# Patient Record
Sex: Female | Born: 2000 | Race: White | Hispanic: No | Marital: Married | Smoking: Never smoker
Health system: Southern US, Community
[De-identification: ages and names within clinical notes are randomized; demographics above are authoritative.]

## PROBLEM LIST (undated history)

## (undated) ENCOUNTER — Inpatient Hospital Stay (HOSPITAL_COMMUNITY): Payer: Self-pay

## (undated) DIAGNOSIS — F3181 Bipolar II disorder: Secondary | ICD-10-CM

## (undated) DIAGNOSIS — T7840XA Allergy, unspecified, initial encounter: Secondary | ICD-10-CM

## (undated) DIAGNOSIS — K8020 Calculus of gallbladder without cholecystitis without obstruction: Secondary | ICD-10-CM

## (undated) DIAGNOSIS — F419 Anxiety disorder, unspecified: Secondary | ICD-10-CM

## (undated) DIAGNOSIS — F4310 Post-traumatic stress disorder, unspecified: Secondary | ICD-10-CM

## (undated) DIAGNOSIS — J45909 Unspecified asthma, uncomplicated: Secondary | ICD-10-CM

## (undated) DIAGNOSIS — I471 Supraventricular tachycardia, unspecified: Secondary | ICD-10-CM

## (undated) DIAGNOSIS — T8859XA Other complications of anesthesia, initial encounter: Secondary | ICD-10-CM

## (undated) DIAGNOSIS — Z8759 Personal history of other complications of pregnancy, childbirth and the puerperium: Secondary | ICD-10-CM

## (undated) DIAGNOSIS — F319 Bipolar disorder, unspecified: Secondary | ICD-10-CM

## (undated) HISTORY — PX: OTHER SURGICAL HISTORY: SHX169

## (undated) HISTORY — DX: Other complications of anesthesia, initial encounter: T88.59XA

## (undated) HISTORY — PX: NASAL RECONSTRUCTION: SHX2069

## (undated) HISTORY — DX: Personal history of other complications of pregnancy, childbirth and the puerperium: Z87.59

---
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
## 2021-06-17 ENCOUNTER — Ambulatory Visit
Admission: EM | Admit: 2021-06-17 | Discharge: 2021-06-17 | Disposition: A | Discharge status: Home / Self Care | Payer: Medicaid Other | Attending: Family Medicine | Admitting: Family Medicine

## 2021-06-17 DIAGNOSIS — J069 Acute upper respiratory infection, unspecified: Secondary | ICD-10-CM

## 2021-06-17 DIAGNOSIS — J4541 Moderate persistent asthma with (acute) exacerbation: Secondary | ICD-10-CM

## 2021-06-17 DIAGNOSIS — J029 Acute pharyngitis, unspecified: Secondary | ICD-10-CM

## 2021-06-17 LAB — POCT RAPID STREP A (OFFICE): Rapid Strep A Screen: NEGATIVE

## 2021-06-17 MED ORDER — PREDNISONE 20 MG PO TABS: 40.0000 mg | ORAL_TABLET | Freq: Every day | ORAL | 0 refills | Status: AC

## 2021-06-17 MED ORDER — BENZONATATE 100 MG PO CAPS: 100.0000 mg | ORAL_CAPSULE | Freq: Three times a day (TID) | ORAL | 0 refills | Status: DC | PRN

## 2021-06-17 NOTE — Discharge Instructions (Addendum)
Your strep test is negative.  Culture of the throat will be sent, and staff will notify you if that is in turn positive.  Take benzonatate 100 mg, 1 tab every 8 hours as needed for cough.  Prednisone 20 mg--take 2 daily for 5 days

## 2021-06-17 NOTE — ED Provider Notes (Signed)
[name]    CSN: [id number] Arrival date & time: 06/17/21  1032      History   Chief Complaint Chief Complaint  Patient presents with   URI    HPI Emily Golden is a 21 y.o. female.    URI  Here for a 1 week history of cough, sore throat, and congestion.  She has been also wheezing more frequently than usual.  She does have a history of asthma.  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      Home Medications    Prior to Admission medications   Medication Sig Start Date End Date Taking? Authorizing Provider  benzonatate (TESSALON) 100 MG capsule Take 1 capsule (100 mg total) by mouth 3 (three) times daily as needed for cough. 06/17/21  Yes Nakina Spatz, Janace Aris, MD  predniSONE (DELTASONE) 20 MG tablet Take 2 tablets (40 mg total) by mouth daily with breakfast for 5 days. 06/17/21 06/22/21 Yes Reinhart Saulters, Janace Aris, MD  albuterol (PROVENTIL HFA;VENTOLIN HFA) 108 (90 Base) MCG/ACT inhaler Inhale 2 puffs into the lungs every 6 (six) hours as needed for wheezing or shortness of breath.    [provider]  beclomethasone (QVAR) 80 MCG/ACT inhaler Inhale 2 puffs into the lungs daily.    [provider]  busPIRone (BUSPAR) 10 MG tablet Take 10 mg by mouth 2 (two) times daily. 05/22/20   [provider]  escitalopram (LEXAPRO) 10 MG  tablet Take 10 mg by mouth daily.    [provider]  fexofenadine (ALLEGRA) 60 MG tablet Take 60 mg by mouth 2 (two) times daily.    [provider]  FLUoxetine (PROZAC) 40 MG capsule Take 1 capsule (40 mg total) by mouth daily. 06/19/18   Leata Mouse, MD  hydrOXYzine (ATARAX/VISTARIL) 50 MG tablet Take 1 tablet (50 mg total) by mouth at bedtime. 06/18/18   Leata Mouse, MD  ibuprofen (ADVIL) 800 MG tablet Take 1 tablet (800 mg total) by mouth every 8 (eight) hours as needed (for headaches). 08/24/20   Valinda Hoar, NP  medroxyPROGESTERone Acetate 150 MG/ML SUSY Inject 150 mg into the muscle every 3 (three) months. Depo 10/13/17   [provider]  ondansetron (ZOFRAN ODT) 4 MG disintegrating tablet Take 1 tablet (4 mg total) by mouth every 8 (eight) hours as needed for nausea or vomiting. 05/09/19   Cathie Hoops, Amy V, PA-C  pantoprazole (PROTONIX) 20 MG tablet Take 20 mg by mouth daily.    [provider]  prazosin (MINIPRESS) 2 MG capsule Take 1 capsule (2 mg total) by mouth at bedtime. 06/18/18   Leata Mouse, MD  pseudoephedrine (SUDAFED) 30 MG tablet Take 1 tablet (30 mg total) by mouth every 4 (four) hours as needed for congestion. 10/20/20   Gustavus Bryant, FNP  Soft Lens  Products (REWETTING DROPS) SOLN 1-3 drops as needed (to re-wet).    [provider]  sucralfate (CARAFATE) 1 g tablet Take 1 g by mouth 4 (four) times daily -  with meals and at bedtime.    [provider]    Family History Family History  Problem Relation Age of Onset   Diabetes Maternal Grandmother    Hypertension Maternal Grandmother    Hyperlipidemia Maternal Grandmother    Heart disease Maternal Grandfather    Cancer Maternal Grandfather    Diabetes Maternal Grandfather    Heart failure Neg Hx     Social History Social History   Tobacco Use   Smoking status: Former    Types: Cigarettes    Quit date: 04/09/2018    Years since  quitting: 3.1   Smokeless tobacco: Never   Tobacco comments:    Says no smoking for 2 months  Vaping Use   Vaping Use: Former  Substance Use Topics   Alcohol use: Not Currently   Drug use: Not Currently    Comment: Reports nt for 2 months     Allergies   Other, Strawberry (diagnostic), and Strawberry extract   Review of Systems Review of Systems   Physical Exam Triage Vital Signs ED Triage Vitals  Enc Vitals Group     BP --      Pulse Rate 06/17/21 1101 78     Resp 06/17/21 1101 18     Temp 06/17/21 1101 98.1 F (36.7 C)     Temp Source 06/17/21 1101 Oral     SpO2 06/17/21 1101 98 %     Weight --      Height --      Head Circumference --      Peak Flow --      Pain Score 06/17/21 1105 5     Pain Loc --      Pain Edu? --      Excl. in GC? --    No data found.  Updated Vital Signs Pulse 78   Temp 98.1 F (36.7 C) (Oral)   Resp 18   LMP 05/26/2021   SpO2 98%   Visual Acuity Right Eye Distance:   Left Eye Distance:   Bilateral Distance:    Right Eye Near:   Left Eye Near:    Bilateral Near:     Physical Exam Vitals reviewed.  Constitutional:      General: She is not in acute distress.    Appearance: She is not toxic-appearing.  HENT:     Right Ear: Tympanic membrane and ear canal normal.     Left Ear: Tympanic membrane and ear canal normal.     Nose: Nose normal.     Mouth/Throat:     Mouth: Mucous membranes are moist.     Comments: There is some clear mucus in the tonsillar crypts and tonsils are 2+ hypertrophy.  There is very mild erythema of the tonsils no asymmetry Eyes:     Extraocular Movements: Extraocular movements intact.     Conjunctiva/sclera: Conjunctivae normal.     Pupils: Pupils are equal, round, and reactive to light.  Cardiovascular:     Rate and Rhythm: Normal rate and regular rhythm.     Heart sounds: No murmur heard. Pulmonary:     Effort: Pulmonary effort is normal. No respiratory distress.     Breath sounds: No  stridor. No wheezing, rhonchi or rales.  Musculoskeletal:     Cervical back: Neck supple.  Lymphadenopathy:     Cervical: No cervical adenopathy.  Skin:    Capillary Refill: Capillary refill takes less than 2 seconds.     Coloration: Skin is not jaundiced or pale.  Neurological:     General: No focal deficit present.     Mental Status: She is alert and oriented to person, place, and time.  Psychiatric:        Behavior: Behavior normal.      UC Treatments / Results  Labs (all labs ordered are listed, but only abnormal results are displayed) Labs Reviewed  CULTURE, GROUP A STREP [facility])  POCT RAPID STREP A (OFFICE)    EKG   Radiology No results found.  Procedures Procedures (including critical care time)  Medications Ordered in UC Medications - No data to display  Initial Impression / Assessment and Plan / UC Course  I have reviewed the triage vital signs and the nursing notes.  Pertinent labs & imaging results that were available during my care of the patient were reviewed by me and considered in my medical decision making (see chart for details).     Rapid strep is negative, so we will send culture and treat per protocol if positive I am going to send her a prescription for prednisone to treat asthma exacerbation.  Final Clinical Impressions(s) / UC Diagnoses   Final diagnoses:  Viral URI with cough  Sore throat  Moderate persistent asthma with (acute) exacerbation     Discharge Instructions      Your strep test is negative.  Culture of the throat will be sent, and staff will notify you if that is in turn positive.  Take benzonatate 100 mg, 1 tab every 8 hours as needed for cough.  Prednisone 20 mg--take 2 daily for 5 days     ED Prescriptions     Medication Sig Dispense Auth. Provider   predniSONE (DELTASONE) 20 MG tablet Take 2 tablets (40 mg total) by mouth daily with breakfast for 5 days. 10 tablet Zenia Resides, MD   benzonatate  (TESSALON) 100 MG capsule Take 1 capsule (100 mg total) by mouth 3 (three) times daily as needed for cough. 21 capsule Zenia Resides, MD      PDMP not reviewed this encounter.   Zenia Resides, MD 06/17/21 1125

## 2021-06-17 NOTE — ED Triage Notes (Signed)
Pt presents with ongoing sore throat, nasal congestion, and bilateral ear pain X 1 week.

## 2021-06-20 LAB — CULTURE, GROUP A STREP (THRC)

## 2021-07-01 ENCOUNTER — Ambulatory Visit (HOSPITAL_COMMUNITY)
Admission: EM | Admit: 2021-07-01 | Discharge: 2021-07-01 | Disposition: A | Discharge status: Other Acute Inpatient Hospital | Payer: Medicaid Other | Attending: Family | Admitting: Family

## 2021-07-01 ENCOUNTER — Inpatient Hospital Stay (HOSPITAL_COMMUNITY)
Admission: AD | Admit: 2021-07-01 | Discharge: 2021-07-05 | DRG: 885 | Disposition: A | Discharge status: Home / Self Care | Payer: Medicaid Other | Source: Intra-hospital | Attending: Psychiatry | Admitting: Psychiatry

## 2021-07-01 ENCOUNTER — Other Ambulatory Visit: Payer: Self-pay

## 2021-07-01 DIAGNOSIS — F411 Generalized anxiety disorder: Secondary | ICD-10-CM | POA: Diagnosis present

## 2021-07-01 DIAGNOSIS — F909 Attention-deficit hyperactivity disorder, unspecified type: Secondary | ICD-10-CM | POA: Diagnosis present

## 2021-07-01 DIAGNOSIS — Z9151 Personal history of suicidal behavior: Secondary | ICD-10-CM

## 2021-07-01 DIAGNOSIS — G4700 Insomnia, unspecified: Secondary | ICD-10-CM | POA: Diagnosis present

## 2021-07-01 DIAGNOSIS — Z20822 Contact with and (suspected) exposure to covid-19: Secondary | ICD-10-CM | POA: Diagnosis present

## 2021-07-01 DIAGNOSIS — R480 Dyslexia and alexia: Secondary | ICD-10-CM | POA: Diagnosis present

## 2021-07-01 DIAGNOSIS — Z79899 Other long term (current) drug therapy: Secondary | ICD-10-CM | POA: Diagnosis not present

## 2021-07-01 DIAGNOSIS — Z91018 Allergy to other foods: Secondary | ICD-10-CM | POA: Diagnosis not present

## 2021-07-01 DIAGNOSIS — R413 Other amnesia: Secondary | ICD-10-CM | POA: Diagnosis present

## 2021-07-01 DIAGNOSIS — F3181 Bipolar II disorder: Secondary | ICD-10-CM | POA: Diagnosis present

## 2021-07-01 DIAGNOSIS — Z83438 Family history of other disorder of lipoprotein metabolism and other lipidemia: Secondary | ICD-10-CM

## 2021-07-01 DIAGNOSIS — Z9152 Personal history of nonsuicidal self-harm: Secondary | ICD-10-CM | POA: Insufficient documentation

## 2021-07-01 DIAGNOSIS — F1090 Alcohol use, unspecified, uncomplicated: Secondary | ICD-10-CM | POA: Diagnosis present

## 2021-07-01 DIAGNOSIS — F22 Delusional disorders: Secondary | ICD-10-CM | POA: Diagnosis present

## 2021-07-01 DIAGNOSIS — F4310 Post-traumatic stress disorder, unspecified: Secondary | ICD-10-CM | POA: Insufficient documentation

## 2021-07-01 DIAGNOSIS — Z818 Family history of other mental and behavioral disorders: Secondary | ICD-10-CM

## 2021-07-01 DIAGNOSIS — Z833 Family history of diabetes mellitus: Secondary | ICD-10-CM

## 2021-07-01 DIAGNOSIS — Z8249 Family history of ischemic heart disease and other diseases of the circulatory system: Secondary | ICD-10-CM

## 2021-07-01 DIAGNOSIS — F332 Major depressive disorder, recurrent severe without psychotic features: Secondary | ICD-10-CM | POA: Diagnosis present

## 2021-07-01 DIAGNOSIS — F17210 Nicotine dependence, cigarettes, uncomplicated: Secondary | ICD-10-CM | POA: Diagnosis present

## 2021-07-01 DIAGNOSIS — J45909 Unspecified asthma, uncomplicated: Secondary | ICD-10-CM | POA: Diagnosis present

## 2021-07-01 DIAGNOSIS — F410 Panic disorder [episodic paroxysmal anxiety] without agoraphobia: Secondary | ICD-10-CM | POA: Diagnosis present

## 2021-07-01 DIAGNOSIS — Z720 Tobacco use: Secondary | ICD-10-CM

## 2021-07-01 DIAGNOSIS — F17290 Nicotine dependence, other tobacco product, uncomplicated: Secondary | ICD-10-CM | POA: Diagnosis present

## 2021-07-01 DIAGNOSIS — R45851 Suicidal ideations: Secondary | ICD-10-CM | POA: Diagnosis present

## 2021-07-01 DIAGNOSIS — F1010 Alcohol abuse, uncomplicated: Secondary | ICD-10-CM

## 2021-07-01 DIAGNOSIS — F17200 Nicotine dependence, unspecified, uncomplicated: Secondary | ICD-10-CM

## 2021-07-01 LAB — COMPREHENSIVE METABOLIC PANEL
ALT: 22 U/L (ref 0–44)
AST: 21 U/L (ref 15–41)
Albumin: 3.9 g/dL (ref 3.5–5.0)
Alkaline Phosphatase: 84 U/L (ref 38–126)
Anion gap: 9 (ref 5–15)
BUN: 7 mg/dL (ref 6–20)
CO2: 23 mmol/L (ref 22–32)
Calcium: 9.5 mg/dL (ref 8.9–10.3)
Chloride: 108 mmol/L (ref 98–111)
Creatinine, Ser: 0.64 mg/dL (ref 0.44–1.00)
GFR, Estimated: >60 mL/min (ref >60)
Glucose, Bld: 71 mg/dL (ref 70–99)
Potassium: 3.9 mmol/L (ref 3.5–5.1)
Sodium: 140 mmol/L (ref 135–145)
Total Bilirubin: 0.8 mg/dL (ref 0.3–1.2)
Total Protein: 6.4 g/dL — ABNORMAL LOW (ref 6.5–8.1)

## 2021-07-01 LAB — POCT URINE DRUG SCREEN - MANUAL ENTRY (I-SCREEN)
POC Amphetamine UR: NOT DETECTED
POC Buprenorphine (BUP): NOT DETECTED
POC Cocaine UR: NOT DETECTED
POC Marijuana UR: NOT DETECTED
POC Methadone UR: NOT DETECTED
POC Methamphetamine UR: NOT DETECTED
POC Morphine: NOT DETECTED
POC Oxazepam (BZO): NOT DETECTED
POC Oxycodone UR: NOT DETECTED
POC Secobarbital (BAR): NOT DETECTED

## 2021-07-01 LAB — LIPID PANEL
Cholesterol: 159 mg/dL (ref 0–200)
HDL: 42 mg/dL (ref >40)
LDL Cholesterol: 102 mg/dL — ABNORMAL HIGH (ref 0–99)
Total CHOL/HDL Ratio: 3.8 RATIO
Triglycerides: 76 mg/dL (ref <150)
VLDL: 15 mg/dL (ref 0–40)

## 2021-07-01 LAB — TSH: TSH: 1.093 u[IU]/mL (ref 0.350–4.500)

## 2021-07-01 LAB — CBC WITH DIFFERENTIAL/PLATELET
Abs Immature Granulocytes: 0.02 10*3/uL (ref 0.00–0.07)
Basophils Absolute: 0.1 10*3/uL (ref 0.0–0.1)
Basophils Relative: 1 %
Eosinophils Absolute: 0.1 10*3/uL (ref 0.0–0.5)
Eosinophils Relative: 1 %
HCT: 40 % (ref 36.0–46.0)
Hemoglobin: 12.9 g/dL (ref 12.0–15.0)
Immature Granulocytes: 0 %
Lymphocytes Relative: 28 %
Lymphs Abs: 2.1 10*3/uL (ref 0.7–4.0)
MCH: 27.6 pg (ref 26.0–34.0)
MCHC: 32.3 g/dL (ref 30.0–36.0)
MCV: 85.5 fL (ref 80.0–100.0)
Monocytes Absolute: 0.5 10*3/uL (ref 0.1–1.0)
Monocytes Relative: 7 %
Neutro Abs: 4.9 10*3/uL (ref 1.7–7.7)
Neutrophils Relative %: 63 %
Platelets: 278 10*3/uL (ref 150–400)
RBC: 4.68 MIL/uL (ref 3.87–5.11)
RDW: 13.2 % (ref 11.5–15.5)
WBC: 7.6 10*3/uL (ref 4.0–10.5)
nRBC: 0 % (ref 0.0–0.2)

## 2021-07-01 LAB — HEMOGLOBIN A1C
Hgb A1c MFr Bld: 4.8 % (ref 4.8–5.6)
Mean Plasma Glucose: 91 mg/dL

## 2021-07-01 LAB — MAGNESIUM: Magnesium: 2.1 mg/dL (ref 1.7–2.4)

## 2021-07-01 LAB — ETHANOL: Alcohol, Ethyl (B): <10 mg/dL (ref <10)

## 2021-07-01 LAB — POCT PREGNANCY, URINE: Preg Test, Ur: NEGATIVE

## 2021-07-01 LAB — RESP PANEL BY RT-PCR (FLU A&B, COVID) ARPGX2
Influenza A by PCR: NEGATIVE
Influenza B by PCR: NEGATIVE
SARS Coronavirus 2 by RT PCR: NEGATIVE

## 2021-07-01 LAB — POC SARS CORONAVIRUS 2 AG: SARSCOV2ONAVIRUS 2 AG: NEGATIVE

## 2021-07-01 MED ORDER — ACETAMINOPHEN 325 MG PO TABS: 650.0000 mg | ORAL_TABLET | Freq: Four times a day (QID) | ORAL | Status: DC | PRN

## 2021-07-01 MED ORDER — MAGNESIUM HYDROXIDE 400 MG/5ML PO SUSP: 30.0000 mL | Freq: Every day | ORAL | Status: DC | PRN

## 2021-07-01 MED ORDER — HYDROXYZINE HCL 25 MG PO TABS
25.0000 mg | ORAL_TABLET | Freq: Three times a day (TID) | ORAL | Status: DC | PRN
  Administered 2021-07-02 – 2021-07-04 (×3): 25 mg via ORAL
  Filled 2021-07-01: qty 10
  Filled 2021-07-01 (×3): qty 1

## 2021-07-01 MED ORDER — ACETAMINOPHEN 325 MG PO TABS
650.0000 mg | ORAL_TABLET | Freq: Four times a day (QID) | ORAL | Status: DC | PRN
  Administered 2021-07-05: 650 mg via ORAL
  Filled 2021-07-01: qty 2

## 2021-07-01 MED ORDER — TRAZODONE HCL 50 MG PO TABS
50.0000 mg | ORAL_TABLET | Freq: Every evening | ORAL | Status: DC | PRN
Start: 2021-07-01 — End: 2021-07-01

## 2021-07-01 MED ORDER — TRAZODONE HCL 50 MG PO TABS
50.0000 mg | ORAL_TABLET | Freq: Every evening | ORAL | Status: DC | PRN
Start: 2021-07-01 — End: 2021-07-03
  Administered 2021-07-02 (×2): 50 mg via ORAL
  Filled 2021-07-01 (×2): qty 1

## 2021-07-01 MED ORDER — ALUM & MAG HYDROXIDE-SIMETH 200-200-20 MG/5ML PO SUSP: 30.0000 mL | ORAL | Status: DC | PRN

## 2021-07-01 MED ORDER — NICOTINE 14 MG/24HR TD PT24
14.0000 mg | MEDICATED_PATCH | Freq: Every day | TRANSDERMAL | Status: DC
  Administered 2021-07-01: 14 mg via TRANSDERMAL
  Filled 2021-07-01: qty 1

## 2021-07-01 MED ORDER — ALUM & MAG HYDROXIDE-SIMETH 200-200-20 MG/5ML PO SUSP
30.0000 mL | ORAL | Status: DC | PRN
Start: 2021-07-01 — End: 2021-07-05

## 2021-07-01 MED ORDER — HYDROXYZINE HCL 25 MG PO TABS
25.0000 mg | ORAL_TABLET | Freq: Three times a day (TID) | ORAL | Status: DC | PRN
Start: 2021-07-01 — End: 2021-07-01

## 2021-07-01 NOTE — Progress Notes (Signed)
Pt was accepted to [facility] 07/01/2021; Bed Assignment 303-2 PENDING Labs and COVID-19 at 2100.  Pt meets inpatient criteria per Doran Heater, FNP  Attending Physician will be Dr. Phineas Inches  Report can be called to: Adult unit: [phone]  Voluntary can be faxed to [phone].  Pt can arrive after 2100  Care Team notified: [facility] Rona Ravens, RN, Doran Heater, FNP, and Perry Mount, RN.  Kelton Pillar, LCSWA 07/01/2021 @ 4:43 PM
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
## 2021-07-01 NOTE — ED Notes (Signed)
Safe transport called to transfer pt to [facility]

## 2021-07-01 NOTE — ED Notes (Signed)
Report given to Erskine Squibb RN@[facility]  adult unit

## 2021-07-01 NOTE — ED Notes (Signed)
Pt sleeping at present, no distress noted, monitoring for safety. 

## 2021-07-01 NOTE — ED Notes (Signed)
Offsite Distribution called to collect STAT specimens and to deliver to [facility].   Collected via L hand. Pt tolerated well.

## 2021-07-01 NOTE — Progress Notes (Signed)
Patient states that she has been depressed for the past couple months, but states that her depression has increased since last Thursday when she found out hat her husband was texting another woman.  Patient states that she was taking Prozac, anxiety medication, a mood stabilizer and PTSD medication, but states that she stopped taking them in October because she states that she felt like a Zombie. Patient states that she has been hospitalized on two occasions for her depression. Patient states that she has attempted suicide on one occasion in the past.  Patient also has a history of cutting, but states that she has has not cut in 1.5 weeks.  Patient states that she has experienced thoughts of walking in front of a car and states that she does not care if she dies.  No HI/Psychosis.  Patient states that she has been drinking daily since Monday, but states that prior to that she was not drinking much or often.  Patient states that she has been sleeping well, but states that she has experienced a decrease in appetite.

## 2021-07-01 NOTE — ED Notes (Signed)
Pt compliant with all admission procedures. Denies current SI but said she has been depressed the past few weeks and having suicidal thoughts. Denies HI, AVH. Oriented to unit. Pt has already been accepted to [facility] and will d/c to there at 2100. Pt signed voluntary form and it has been faxed to [facility].

## 2021-07-01 NOTE — ED Provider Notes (Signed)
[facility] Urgent Care Continuous Assessment Admission H&P  Date: 07/01/21 Patient Name: Emily Golden MRN: [id number] Chief Complaint:  Chief Complaint  Patient presents with   Depression   Anxiety   Suicidal      Diagnoses:  Final diagnoses:  MDD (major depressive disorder), recurrent severe, without psychosis (HCC)  Suicidal ideation    HPI: Patient presents voluntarily to [facility] behavioral health for walk-in assessment.  Patient is accompanied by her grandmother, Emily Golden, who remains present during assessment per patient preference.    Patient is assessed, face-to-face, by nurse practitioner, seated in assessment area, no acute distress.  She  is alert and oriented, pleasant and cooperative during assessment.   Emily Golden reports passive suicidal ideations ongoing for "a few months," worsening/intensifying for one week. Patient denies plan to complete suicide today. She is unable to verbally contract for safety at this time.  She endorses history of 1 previous suicide attempt by intentional overdose 4 years ago.  She also endorses history of nonsuicidal self-harm behavior by cutting.  Most recent cutting episode 1 week ago.  Patient self-inflicted superficial laceration to her right upper thigh.  Wound is clean and open to air, patient denies pain, no evidence of inflammation, no drainage reported.  Patient reports recent stressors include "finding out my husband was texting another girl 1 week ago."  Chronic stressors include "money and life."  She reports depressed mood and passive suicidal ideation for several months.  She reports decreased appetite for 1 week.  She reports feelings of hopelessness and worthlessness.  Emily Golden shares she has increased alcohol use since Monday.  Typically she consumes 1 beer less than 1 time per month.  Since Monday she has consumed a few sips of alcohol daily.  Patient has been diagnosed with major depressive disorder, PTSD and suicidal ideation.  She  is not linked with outpatient psychiatry currently.  Most recently linked with outpatient psychiatry in October 2022, she was stable on fluoxetine however duloxetine "made me feel like a zombie."  She endorses history of 2 previous inpatient psychiatric hospitalizations.  Family mental health history includes patient's mother who has been diagnosed with anxiety in patient's father who has been diagnosed with depression, anxiety, bipolar disorder and alcohol use disorder.  Emily Golden presents with depressed mood, congruent affect. She denies homicidal ideations. Patient has normal speech and behavior.  She  denies auditory and visual hallucinations.  Patient is able to converse coherently with goal-directed thoughts and no distractibility or preoccupation.  Denies symptoms of paranoia.  Objectively there is no evidence of psychosis/mania or delusional thinking.  Emily Golden resides in [place] with her husband and 2 roommates.  She endorses access to weapons, guns in home.  She is employed in Medical illustrator.  She denies substance use aside from alcohol.  Patient endorses average sleep.  Patient offered support and encouragement.   PHQ 2-9:   Flowsheet Row ED from 06/17/2021 in [facility]  ED from 11/22/2020 in [facility] EMERGENCY DEPARTMENT ED from 10/20/2020 in [facility] at [facility]   C-SSRS RISK CATEGORY No Risk No Risk No Risk        Total Time spent with patient: 30 minutes  Musculoskeletal  Strength & Muscle Tone: within normal limits Gait & Station: normal Patient leans: N/A  Psychiatric Specialty Exam  Presentation General Appearance: Appropriate for Environment; Casual  Eye Contact:Good  Speech:Clear and Coherent; Normal Rate  Speech Volume:Normal  Handedness:Right  Mood and Affect  Mood:Depressed  Affect:Congruent; Depressed   Thought Process  Thought Processes:Coherent; Goal Directed;  Linear  Descriptions of Associations:Intact  Orientation:Full (Time, Place and Person)  Thought Content:Logical; WDL    Hallucinations:Hallucinations: None  Ideas of Reference:None  Suicidal Thoughts:Suicidal Thoughts: Yes, Passive SI Passive Intent and/or Plan: Without Plan  Homicidal Thoughts:Homicidal Thoughts: No   Sensorium  Memory:Immediate Good; Recent Good  Judgment:Fair  Insight:Fair   Executive Functions  Concentration:Good  Attention Span:Good  Recall:Good  Fund of Knowledge:Good  Language:Good   Psychomotor Activity  Psychomotor Activity:Psychomotor Activity: Normal   Assets  Assets:Communication Skills; Desire for Improvement; Financial Resources/Insurance; Housing; Intimacy; Leisure Time; Physical Health; Resilience; Social Support; Talents/Skills; Vocational/Educational   Sleep  Sleep:Sleep: Fair   Nutritional Assessment (For OBS and FBC admissions only) Has the patient had a weight loss or gain of 10 pounds or more in the last 3 months?: No Has the patient had a decrease in food intake/or appetite?: Yes Does the patient have dental problems?: No Does the patient have eating habits or behaviors that may be indicators of an eating disorder including binging or inducing vomiting?: No Has the patient recently lost weight without trying?: 2.0 Has the patient been eating poorly because of a decreased appetite?: 1 Malnutrition Screening Tool Score: 3 Nutritional Assessment Referrals: Medication/Tx changes    Physical Exam Vitals and nursing note reviewed.  Constitutional:      Appearance: Normal appearance. She is well-developed.  HENT:     Head: Normocephalic and atraumatic.     Nose: Nose normal.  Cardiovascular:     Rate and Rhythm: Normal rate.  Pulmonary:     Effort: Pulmonary effort is normal.  Musculoskeletal:        General: Normal range of motion.     Cervical back: Normal range of motion.  Skin:    General: Skin is warm  and dry.     Comments: Patient reports self-inflicted laceration to right upper thigh.  Neurological:     Mental Status: She is alert and oriented to person, place, and time.  Psychiatric:        Attention and Perception: Attention and perception normal.        Mood and Affect: Mood is depressed.        Speech: Speech normal.        Behavior: Behavior normal. Behavior is cooperative.        Thought Content: Thought content includes suicidal ideation.        Cognition and Memory: Cognition and memory normal.    Review of Systems  Constitutional: Negative.   HENT: Negative.    Eyes: Negative.   Respiratory: Negative.    Cardiovascular: Negative.   Gastrointestinal: Negative.   Genitourinary: Negative.   Musculoskeletal: Negative.   Skin: Negative.   Neurological: Negative.   Psychiatric/Behavioral:  Positive for depression and suicidal ideas.     Blood pressure 128/68, pulse 81, temperature 98.5 F (36.9 C), temperature source Oral, resp. rate 18, last menstrual period 05/26/2021, SpO2 99 %. There is no height or weight on file to calculate BMI.  Past Psychiatric History: MDD, PTSD, suicide attempt  Is the patient at risk to self? Yes  Has the patient been a risk to self in the past 6 months? Yes .    Has the patient been a risk to self within the distant past? Yes   Is the patient a risk to others? No   Has the patient been a risk to others  in the past 6 months? No   Has the patient been a risk to others within the distant past? No   Past Medical History:  Past Medical History:  Diagnosis Date   Allergy    Anxiety    Asthma     Past Surgical History:  Procedure Laterality Date   NASAL RECONSTRUCTION      Family History:  Family History  Problem Relation Age of Onset   Diabetes Maternal Grandmother    Hypertension Maternal Grandmother    Hyperlipidemia Maternal Grandmother    Heart disease Maternal Grandfather    Cancer Maternal Grandfather    Diabetes  Maternal Grandfather    Heart failure Neg Hx     Social History:  Social History   Socioeconomic History   Marital status: Single    Spouse name: Not on file   Number of children: Not on file   Years of education: Not on file   Highest education level: Not on file  Occupational History   Not on file  Tobacco Use   Smoking status: Former    Types: Cigarettes    Quit date: 04/09/2018    Years since quitting: 3.2   Smokeless tobacco: Never   Tobacco comments:    Says no smoking for 2 months  Vaping Use   Vaping Use: Former  Substance and Sexual Activity   Alcohol use: Not Currently   Drug use: Not Currently    Comment: Reports nt for 2 months   Sexual activity: Not Currently    Birth control/protection: Abstinence  Other Topics Concern   Not on file  Social History Narrative   Is in 10th grade at [employer]   Social Determinants of Health   Financial Resource Strain: Not on file  Food Insecurity: Not on file  Transportation Needs: Not on file  Physical Activity: Not on file  Stress: Not on file  Social Connections: Not on file  Intimate Partner Violence: Not on file    SDOH:  SDOH Screenings   Alcohol Screen: Low Risk  (10/19/2017)   Alcohol Screen    Last Alcohol Screening Score (AUDIT): 1  Depression (PHQ2-9): Not on file  Financial Resource Strain: Not on file  Food Insecurity: Not on file  Housing: Not on file  Physical Activity: Not on file  Social Connections: Not on file  Stress: Not on file  Tobacco Use: Medium Risk (06/17/2021)   Patient History    Smoking Tobacco Use: Former    Smokeless Tobacco Use: Never    Passive Exposure: Not on file  Transportation Needs: Not on file    Last Labs:  Admission on 06/17/2021, Discharged on 06/17/2021  Component Date Value Ref Range Status   Rapid Strep A Screen 06/17/2021 Negative  Negative Final   Specimen Description 06/17/2021 THROAT   Final   Special Requests 06/17/2021 NONE    Final   Culture 06/17/2021    Final                   Value:NO GROUP A STREP (S.PYOGENES) ISOLATED Performed at [facility], [address] [place], [place] [address]    Report Status 06/17/2021 06/20/2021 FINAL   Final    Allergies: Other, Strawberry (diagnostic), and Strawberry extract  PTA Medications: (Not in a hospital admission)   Medical Decision Making  Patient reviewed with Dr Bronwen Betters. She will be placed in observation unit while awaiting inpatient psychiatric hospitalization. Richell remains voluntary. She agrees with  plan for inpatient psychiatric hospitalization.  Laboratory studies ordered including CBC, CMP, ethanol, A1c, hepatic function, lipid panel, magnesium and TSH.  Urine pregnancy, urine drug screen ordered.  EKG order initiated.  Current medications: -Acetaminophen 650 mg every 6 as needed/mild pain -Maalox 30 mL oral every 4 as needed/digestion -Hydroxyzine 25 mg 3 times daily as needed/anxiety -Magnesium hydroxide 30 mL daily as needed/mild constipation -Trazodone 50 mg nightly as needed/sleep -Nicotine replacement patch transdermal 14 mg daily    Recommendations  Based on my evaluation the patient does not appear to have an emergency medical condition. Patient reviewed with Dr Bronwen Betters. Patient has been accepted to [facility] behavioral health for inpatient psychiatric admission by Dr. Sherron Flemings.  She verbalizes understanding and agreement with current treatment plan to include transport to [facility] behavioral health.   Lenard Lance, FNP 07/01/21  4:26 PM

## 2021-07-02 ENCOUNTER — Encounter (HOSPITAL_COMMUNITY): Payer: Self-pay | Admitting: Family

## 2021-07-02 ENCOUNTER — Encounter (HOSPITAL_COMMUNITY): Payer: Self-pay

## 2021-07-02 DIAGNOSIS — F909 Attention-deficit hyperactivity disorder, unspecified type: Secondary | ICD-10-CM

## 2021-07-02 DIAGNOSIS — F411 Generalized anxiety disorder: Secondary | ICD-10-CM

## 2021-07-02 DIAGNOSIS — F1010 Alcohol abuse, uncomplicated: Secondary | ICD-10-CM

## 2021-07-02 DIAGNOSIS — F17200 Nicotine dependence, unspecified, uncomplicated: Secondary | ICD-10-CM

## 2021-07-02 DIAGNOSIS — Z720 Tobacco use: Secondary | ICD-10-CM

## 2021-07-02 DIAGNOSIS — F3181 Bipolar II disorder: Principal | ICD-10-CM

## 2021-07-02 HISTORY — DX: Alcohol abuse, uncomplicated: F10.10

## 2021-07-02 LAB — PROLACTIN: Prolactin: 8.2 ng/mL (ref 4.8–23.3)

## 2021-07-02 MED ORDER — NICOTINE 14 MG/24HR TD PT24
14.0000 mg | MEDICATED_PATCH | Freq: Every day | TRANSDERMAL | Status: DC
Start: 2021-07-02 — End: 2021-07-03
  Administered 2021-07-02: 14 mg via TRANSDERMAL
  Filled 2021-07-02 (×2): qty 1

## 2021-07-02 MED ORDER — ALBUTEROL SULFATE HFA 108 (90 BASE) MCG/ACT IN AERS: 1.0000 | INHALATION_SPRAY | RESPIRATORY_TRACT | Status: DC | PRN

## 2021-07-02 MED ORDER — ARIPIPRAZOLE 5 MG PO TABS
5.0000 mg | ORAL_TABLET | Freq: Every day | ORAL | Status: DC
  Administered 2021-07-02 – 2021-07-03 (×2): 5 mg via ORAL
  Filled 2021-07-02 (×4): qty 1

## 2021-07-02 NOTE — Group Note (Signed)
LCSW Group Therapy Note   Group Date: 07/02/2021 Start Time: 1300 End Time: 1400   Type of Therapy and Topic:  Group Therapy: Boundaries  Participation Level:  Minimal  Description of Group: This group will address the use of boundaries in their personal lives. Patients will explore why boundaries are important, the difference between healthy and unhealthy boundaries, and negative and postive outcomes of different boundaries and will look at how boundaries can be crossed.  Patients will be encouraged to identify current boundaries in their own lives and identify what kind of boundary is being set. Facilitators will guide patients in utilizing problem-solving interventions to address and correct types boundaries being used and to address when no boundary is being used. Understanding and applying boundaries will be explored and addressed for obtaining and maintaining a balanced life. Patients will be encouraged to explore ways to assertively make their boundaries and needs known to significant others in their lives, using other group members and facilitator for role play, support, and feedback.  Therapeutic Goals:  1.  Patient will identify areas in their life where setting clear boundaries could be  used to improve their life.  2.  Patient will identify signs/triggers that a boundary is not being respected. 3.  Patient will identify two ways to set boundaries in order to achieve balance in  their lives: 4.  Patient will demonstrate ability to communicate their needs and set boundaries  through discussion and/or role plays  Summary of Patient Progress:   Patient was present for the entirety of group session. Patient participated in opening and closing remarks. However, patient did not contribute at all to the topic of discussion despite encouraged participation.   Therapeutic Modalities:   Cognitive Behavioral Therapy Solution-Focused Therapy  Graeden Bitner W Chania Kochanski, LCSWA 07/02/2021  3:47 PM     

## 2021-07-02 NOTE — Progress Notes (Signed)
Pt complained of dizziness this evening, V/S obtained and was WNL. Pt suspect that the medications she is taking could be making her dizzy. Pt stated her day was okay, reports good appetite. Pt also stated the nicotine patch is not helping much and would like to be on both the patch and gum. Pt denies SI/HI and contracted for safety, will continue to monitor.

## 2021-07-02 NOTE — Tx Team (Signed)
Initial Treatment Plan 07/02/2021 2:36 AM Emily Golden KPQ:[id number]    PATIENT STRESSORS: Financial difficulties   Marital or family conflict   Substance abuse   Traumatic event     PATIENT STRENGTHS: Education administrator  Physical Health    PATIENT IDENTIFIED PROBLEMS: Suicide ideation  Depression  "Find ways to cope on my own without others helping me to cope"  " Be on medication that actually works"               DISCHARGE CRITERIA:  Ability to meet basic life and health needs Improved stabilization in mood, thinking, and/or behavior Medical problems require only outpatient monitoring Safe-care adequate arrangements made Verbal commitment to aftercare and medication compliance  PRELIMINARY DISCHARGE PLAN: Attend aftercare/continuing care group Attend PHP/IOP Attend 12-step recovery group Outpatient therapy Return to previous living arrangement Return to previous work or school arrangements  PATIENT/FAMILY INVOLVEMENT: This treatment plan has been presented to and reviewed with the patient, Emily Golden, and/or family member.  The patient and family have been given the opportunity to ask questions and make suggestions.  Bethann Punches, RN 07/02/2021, 2:36 AM

## 2021-07-02 NOTE — Progress Notes (Signed)
   07/02/21 1350  Psych Admission Type (Psych Patients Only)  Admission Status Voluntary  Psychosocial Assessment  Patient Complaints Depression  Eye Contact Fair  Facial Expression Flat  Affect Depressed  Speech Logical/coherent  Interaction Minimal  Motor Activity Other (Comment) (WNL)  Appearance/Hygiene Unremarkable  Behavior Characteristics Cooperative;Calm  Mood Depressed  Thought Process  Coherency WDL  Content Blaming others  Delusions None reported or observed  Perception WDL  Hallucination None reported or observed  Judgment Poor  Confusion None  Danger to Self  Current suicidal ideation? Denies  Self-Injurious Behavior No self-injurious ideation or behavior indicators observed or expressed   Agreement Not to Harm Self Yes  Description of Agreement verbal  Danger to Others  Danger to Others None reported or observed
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
## 2021-07-02 NOTE — BH IP Treatment Plan (Signed)
Interdisciplinary Treatment and Diagnostic Plan Update  07/02/2021 Time of Session: 9:00am  Emily Golden MRN: [id number]  Principal Diagnosis: MDD (major depressive disorder), recurrent severe, without psychosis ([place])  Secondary Diagnoses: Principal Problem:   MDD (major depressive disorder), recurrent severe, without psychosis ([place])   Current Medications:  Current Facility-Administered Medications  Medication Dose Route Frequency Provider Last Rate Last Admin   acetaminophen (TYLENOL) tablet 650 mg  650 mg Oral Q6H PRN Lucky Rathke, FNP       alum & mag hydroxide-simeth (MAALOX/MYLANTA) 200-200-20 MG/5ML suspension 30 mL  30 mL Oral Q4H PRN Lucky Rathke, FNP       hydrOXYzine (ATARAX) tablet 25 mg  25 mg Oral TID PRN Lucky Rathke, FNP   25 mg at 07/02/21 0024   magnesium hydroxide (MILK OF MAGNESIA) suspension 30 mL  30 mL Oral Daily PRN Lucky Rathke, FNP       traZODone (DESYREL) tablet 50 mg  50 mg Oral QHS PRN Lucky Rathke, FNP   50 mg at 07/02/21 0024   PTA Medications: Medications Prior to Admission  Medication Sig Dispense Refill Last Dose   albuterol (PROVENTIL HFA;VENTOLIN HFA) 108 (90 Base) MCG/ACT inhaler Inhale 2 puffs into the lungs every 6 (six) hours as needed for wheezing or shortness of breath.   Past Month   beclomethasone (QVAR) 80 MCG/ACT inhaler Inhale 2 puffs into the lungs daily.   Past Week   folic acid (FOLVITE) 1 MG tablet Take 1 mg by mouth daily.      ibuprofen (ADVIL) 800 MG tablet Take 1 tablet (800 mg total) by mouth every 8 (eight) hours as needed (for headaches). (Patient taking differently: Take 800 mg by mouth every 8 (eight) hours as needed for headache or cramping (for headaches).) 30 tablet 1 Past Month   medroxyPROGESTERone Acetate 150 MG/ML SUSY Inject 150 mg into the muscle every 3 (three) months. Depo  3     Patient Stressors: Financial difficulties   Marital or family conflict   Substance abuse   Traumatic event    Patient Strengths:  Electronics engineer  Physical Health   Treatment Modalities: Medication Management, Group therapy, Case management,  1 to 1 session with clinician, Psychoeducation, Recreational therapy.   Physician Treatment Plan for Primary Diagnosis: MDD (major depressive disorder), recurrent severe, without psychosis ([place]) Long Term Goal(s):     Short Term Goals:    Medication Management: Evaluate patient's response, side effects, and tolerance of medication regimen.  Therapeutic Interventions: 1 to 1 sessions, Unit Group sessions and Medication administration.  Evaluation of Outcomes: Not Met  Physician Treatment Plan for Secondary Diagnosis: Principal Problem:   MDD (major depressive disorder), recurrent severe, without psychosis ([place])  Long Term Goal(s):     Short Term Goals:       Medication Management: Evaluate patient's response, side effects, and tolerance of medication regimen.  Therapeutic Interventions: 1 to 1 sessions, Unit Group sessions and Medication administration.  Evaluation of Outcomes: Not Met   RN Treatment Plan for Primary Diagnosis: MDD (major depressive disorder), recurrent severe, without psychosis ([place]) Long Term Goal(s): Knowledge of disease and therapeutic regimen to maintain health will improve  Short Term Goals: Ability to remain free from injury will improve, Ability to participate in decision making will improve, Ability to verbalize feelings will improve, Ability to disclose and discuss suicidal ideas, and Ability to identify and develop effective coping behaviors will improve  Medication Management: RN will administer medications  as ordered by provider, will assess and evaluate patient's response and provide education to patient for prescribed medication. RN will report any adverse and/or side effects to prescribing provider.  Therapeutic Interventions: 1 on 1 counseling sessions, Psychoeducation, Medication administration, Evaluate  responses to treatment, Monitor vital signs and CBGs as ordered, Perform/monitor CIWA, COWS, AIMS and Fall Risk screenings as ordered, Perform wound care treatments as ordered.  Evaluation of Outcomes: Not Met   LCSW Treatment Plan for Primary Diagnosis: MDD (major depressive disorder), recurrent severe, without psychosis ([place]) Long Term Goal(s): Safe transition to appropriate next level of care at discharge, Engage patient in therapeutic group addressing interpersonal concerns.  Short Term Goals: Engage patient in aftercare planning with referrals and resources, Increase social support, Increase emotional regulation, Facilitate acceptance of mental health diagnosis and concerns, Identify triggers associated with mental health/substance abuse issues, and Increase skills for wellness and recovery  Therapeutic Interventions: Assess for all discharge needs, 1 to 1 time with Social worker, Explore available resources and support systems, Assess for adequacy in community support network, Educate family and significant other(s) on suicide prevention, Complete Psychosocial Assessment, Interpersonal group therapy.  Evaluation of Outcomes: Not Met   Progress in Treatment: Attending groups: Yes. Participating in groups: Yes. Taking medication as prescribed: Yes. Toleration medication: Yes. Family/Significant other contact made: Yes, individual(s) contacted:  If consents are provided  Patient understands diagnosis: Yes. Discussing patient identified problems/goals with staff: Yes. Medical problems stabilized or resolved: Yes. Denies suicidal/homicidal ideation: Yes. Issues/concerns per patient self-inventory: No.   New problem(s) identified: No, Describe:  None   New Short Term/Long Term Goal(s): medication stabilization, elimination of SI thoughts, development of comprehensive mental wellness plan.   Patient Goals:  "To find better coping skills"   Discharge Plan or Barriers: Patient recently  admitted. CSW will continue to follow and assess for appropriate referrals and possible discharge planning.   Reason for Continuation of Hospitalization: Anxiety Depression Medication stabilization Suicidal ideation  Estimated Length of Stay: 3 to 7 days   Last [place] Suicide Severity Risk Score: [place] Admission (Current) from 07/01/2021 in [facility] Most recent reading at 07/02/2021  2:03 AM ED from 07/01/2021 in [facility] Most recent reading at 07/01/2021  4:58 PM ED from 06/17/2021 in [facility]  Most recent reading at 06/17/2021 11:05 AM  C-SSRS RISK CATEGORY Low Risk Moderate Risk No Risk       Last PHQ 2/9 Scores:     No data to display          Scribe for Treatment Team: Darleen Crocker, Latanya Presser 07/02/2021 10:51 AM

## 2021-07-02 NOTE — Progress Notes (Signed)
[name] is a 21 y.o. female voluntarily admitted for increased depression. Patient states that she has been depressed for the past couple months, but states that her depression has increased since last Thursday when she found out hat her husband was texting another woman. Patient also has a history of cutting, but states that she has has not cut in 1.5 weeks. Patient she has experienced thoughts of walking in front of a car and states that she does not care if she dies. Pt has been calm and cooperative with admission process, alert and oriented x 4. Reports passive SI with no plan and contracted for safety. Skin search completed with no signs of cuts or scratches. Consents signed belongings search completed and pt oriented to unit. Pt stable at this time. Pt given the opportunity to express concerns and ask questions. Pt given toiletries. Will continue to monitor.

## 2021-07-02 NOTE — H&P (Signed)
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 for depression, memory loss and substance abuse. Negative for hallucinations and suicidal ideas. The patient has insomnia. The patient is not nervous/anxious.    Blood pressure 125/67, pulse 65, temperature 98.3 F (36.8 C), temperature source Oral, resp. rate 18, height 5' 7.5" (1.715 m), weight 121 kg, last menstrual period 06/29/2021, SpO2 100 %. Body mass index is 41.17 kg/m.   ASSESSMENT:  Daily contact with patient to assess  and evaluate symptoms and progress in treatment   ASSESSMENT:   Diagnoses / Active Problems: -Bipolar disorder, type 2, current mood episode is depressed, without psychotic features -GAD -PTSD -ADHD -Tobacco use d/o  -R/o Borderline PD    PLAN: Safety and Monitoring:             --  Voluntary admission to inpatient psychiatric unit for safety, stabilization and treatment             -- Daily contact with patient to assess and evaluate symptoms and progress in treatment             -- Patient's case to be discussed in multi-disciplinary team meeting             -- Observation Level : q15 minute checks             -- Vital signs:  q12 hours             -- Precautions: suicide, elopement, and assault   2. Psychiatric Diagnoses and Treatment:               -Start Abilify 5 mg daily     -- The risks/benefits/side-effects/alternatives to this medication were discussed in detail with the patient and time was given for questions. The patient consents to medication trial.                -- Metabolic profile and EKG monitoring obtained while on an atypical antipsychotic (BMI: Lipid Panel: HbgA1c: QTc:)              -- Encouraged patient to participate in unit milieu and in scheduled group therapies              --  Short Term Goals: Ability to identify changes in lifestyle to reduce recurrence of condition will improve, Ability to verbalize feelings will improve, Ability to disclose and discuss suicidal ideas, Ability to demonstrate self-control will improve, Ability to identify and develop effective coping behaviors will improve, Ability to maintain clinical measurements within normal limits will improve, Compliance with prescribed medications will improve, and Ability to identify triggers associated with substance abuse/mental health issues will improve             -- Long Term Goals: Improvement in symptoms so as ready for discharge                3. Medical Issues Being Addressed:     Tobacco (Vaping) use disorder- PRN nicotine patch, consider outpatient tobacco cessation as vaping is negatively contributing to anxiety and mood   4. Discharge Planning:              -- Social work and case management to assist with discharge planning and identification of hospital follow-up needs prior to discharge             -- Estimated LOS: 5-7 days             -- Discharge Concerns: Need to establish a safety plan; Medication compliance and effectiveness             -- Discharge Goals: Return home with outpatient referrals for mental health follow-up including medication management/psychotherapy    Principal Problem:   MDD (major depressive disorder), recurrent severe, without psychosis (HCC)     Treatment Plan Summary:   Observation Level/Precautions:  15 minute checks  Laboratory:   See above  Psychotherapy:  See above  Medications:  See above  Consultations:  See above  Discharge Concerns:  See above  Estimated LOS: 5-7 days  Other:        Physician Treatment Plan for Primary Diagnosis: MDD (major depressive disorder), recurrent severe, without psychosis (HCC) Long Term Goal(s): Improvement in symptoms so as ready for discharge    Ortencia Kick, Medical Student 6/30/202311:49 AM    Total Time Spent in Direct Patient Care:  I personally spent 60 minutes on the unit in direct patient care. The direct patient care time included face-to-face time with the patient, reviewing the patient's chart, communicating with other professionals, and coordinating care. Greater than 50% of this time was spent in counseling or coordinating care with the patient regarding goals of hospitalization, psycho-education, and discharge planning needs.  I personally was present and performed or re-performed the history, physical exam and medical decision-making activities of this service and have verified that the service and findings are accurately documented in the student's note, , as  addended by me or notated below:  I directly edited the note, as above.  I certify that inpatient services furnished can reasonably be expected to improve the patient's condition.    Phineas Inches, MD Psychiatrist

## 2021-07-02 NOTE — Group Note (Signed)
Recreation Therapy Group Note   Group Topic:Stress Management  Group Date: 07/02/2021 Start Time: 0930 End Time: 1000 Facilitators: Caroll Rancher, LRT,CTRS Location: [address] Dayroom   Goal Area(s) Addresses:  Patient will actively participate in stress management techniques presented during session.  Patient will successfully identify benefit of practicing stress management post d/c.   Group Description:  Guided Imagery. LRT provided education, instruction, and demonstration on practice of visualization via guided imagery. Patient was asked to participate in the technique introduced during session. LRT debriefed including topics of mindfulness, stress management and specific scenarios each patient could use these techniques. Patients were given suggestions of ways to access scripts post d/c and encouraged to explore Youtube and other apps available on smartphones, tablets, and computers.   Clinical Observations/Individualized Feedback:  Due to orientation group running over, recreation therapy group did not occur as scheduled.     Plan: Continue to engage patient in RT group sessions 2-3x/week.   Caroll Rancher, LRT,CTRS 07/02/2021 2:02 PM

## 2021-07-02 NOTE — Plan of Care (Signed)
  Problem: Activity: Goal: Interest or engagement in activities will improve Outcome: Progressing   Problem: Coping: Goal: Ability to verbalize frustrations and anger appropriately will improve Outcome: Progressing   Problem: Safety: Goal: Periods of time without injury will increase Outcome: Progressing   
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
## 2021-07-02 NOTE — BH IP Treatment Plan (Addendum)
Interdisciplinary Treatment and Diagnostic Plan Update  07/02/2021 Time of Session: 9:00am  Emily Golden MRN: [id number]  Principal Diagnosis: MDD (major depressive disorder), recurrent severe, without psychosis ([place])  Secondary Diagnoses: Principal Problem:   MDD (major depressive disorder), recurrent severe, without psychosis ([place])   Current Medications:  Current Facility-Administered Medications  Medication Dose Route Frequency Provider Last Rate Last Admin   acetaminophen (TYLENOL) tablet 650 mg  650 mg Oral Q6H PRN Lucky Rathke, FNP       alum & mag hydroxide-simeth (MAALOX/MYLANTA) 200-200-20 MG/5ML suspension 30 mL  30 mL Oral Q4H PRN Lucky Rathke, FNP       hydrOXYzine (ATARAX) tablet 25 mg  25 mg Oral TID PRN Lucky Rathke, FNP   25 mg at 07/02/21 0024   magnesium hydroxide (MILK OF MAGNESIA) suspension 30 mL  30 mL Oral Daily PRN Lucky Rathke, FNP       traZODone (DESYREL) tablet 50 mg  50 mg Oral QHS PRN Lucky Rathke, FNP   50 mg at 07/02/21 0024   PTA Medications: Medications Prior to Admission  Medication Sig Dispense Refill Last Dose   albuterol (PROVENTIL HFA;VENTOLIN HFA) 108 (90 Base) MCG/ACT inhaler Inhale 2 puffs into the lungs every 6 (six) hours as needed for wheezing or shortness of breath.   Past Month   beclomethasone (QVAR) 80 MCG/ACT inhaler Inhale 2 puffs into the lungs daily.   Past Week   folic acid (FOLVITE) 1 MG tablet Take 1 mg by mouth daily.      ibuprofen (ADVIL) 800 MG tablet Take 1 tablet (800 mg total) by mouth every 8 (eight) hours as needed (for headaches). (Patient taking differently: Take 800 mg by mouth every 8 (eight) hours as needed for headache or cramping (for headaches).) 30 tablet 1 Past Month   medroxyPROGESTERone Acetate 150 MG/ML SUSY Inject 150 mg into the muscle every 3 (three) months. Depo  3     Patient Stressors: Financial difficulties   Marital or family conflict   Substance abuse   Traumatic event    Patient Strengths:  Electronics engineer  Physical Health   Treatment Modalities: Medication Management, Group therapy, Case management,  1 to 1 session with clinician, Psychoeducation, Recreational therapy.   Physician Treatment Plan for Primary Diagnosis: MDD (major depressive disorder), recurrent severe, without psychosis ([place]) Long Term Goal(s):     Short Term Goals:    Medication Management: Evaluate patient's response, side effects, and tolerance of medication regimen.  Therapeutic Interventions: 1 to 1 sessions, Unit Group sessions and Medication administration.  Evaluation of Outcomes: Not Met  Physician Treatment Plan for Secondary Diagnosis: Principal Problem:   MDD (major depressive disorder), recurrent severe, without psychosis ([place])  Long Term Goal(s):     Short Term Goals:       Medication Management: Evaluate patient's response, side effects, and tolerance of medication regimen.  Therapeutic Interventions: 1 to 1 sessions, Unit Group sessions and Medication administration.  Evaluation of Outcomes: Not Met   RN Treatment Plan for Primary Diagnosis: MDD (major depressive disorder), recurrent severe, without psychosis ([place]) Long Term Goal(s): Knowledge of disease and therapeutic regimen to maintain health will improve  Short Term Goals: Ability to remain free from injury will improve, Ability to participate in decision making will improve, Ability to verbalize feelings will improve, Ability to disclose and discuss suicidal ideas, and Ability to identify and develop effective coping behaviors will improve  Medication Management: RN will administer medications  as ordered by provider, will assess and evaluate patient's response and provide education to patient for prescribed medication. RN will report any adverse and/or side effects to prescribing provider.  Therapeutic Interventions: 1 on 1 counseling sessions, Psychoeducation, Medication administration, Evaluate  responses to treatment, Monitor vital signs and CBGs as ordered, Perform/monitor CIWA, COWS, AIMS and Fall Risk screenings as ordered, Perform wound care treatments as ordered.  Evaluation of Outcomes: Not Met   LCSW Treatment Plan for Primary Diagnosis: MDD (major depressive disorder), recurrent severe, without psychosis ([place]) Long Term Goal(s): Safe transition to appropriate next level of care at discharge, Engage patient in therapeutic group addressing interpersonal concerns.  Short Term Goals: Engage patient in aftercare planning with referrals and resources, Increase social support, Increase emotional regulation, Facilitate acceptance of mental health diagnosis and concerns, Identify triggers associated with mental health/substance abuse issues, and Increase skills for wellness and recovery  Therapeutic Interventions: Assess for all discharge needs, 1 to 1 time with Social worker, Explore available resources and support systems, Assess for adequacy in community support network, Educate family and significant other(s) on suicide prevention, Complete Psychosocial Assessment, Interpersonal group therapy.  Evaluation of Outcomes: Not Met   Progress in Treatment: Attending groups: Yes. Participating in groups: Yes. Taking medication as prescribed: Yes. Toleration medication: Yes. Family/Significant other contact made: No, will contact:  Pt Declined Consents  Patient understands diagnosis: Yes. Discussing patient identified problems/goals with staff: Yes. Medical problems stabilized or resolved: Yes. Denies suicidal/homicidal ideation: Yes. Issues/concerns per patient self-inventory: No.   New problem(s) identified: No, Describe:  None   New Short Term/Long Term Goal(s): medication stabilization, elimination of SI thoughts, development of comprehensive mental wellness plan.   Patient Goals: "To find better coping skills"   Discharge Plan or Barriers:  Patient recently admitted. CSW  will continue to follow and assess for appropriate referrals and possible discharge planning.   Reason for Continuation of Hospitalization: Anxiety Depression Medication stabilization Suicidal ideation  Estimated Length of Stay: 3 to 7 days   Last [place] Suicide Severity Risk Score: [place] Admission (Current) from 07/01/2021 in [facility] Most recent reading at 07/02/2021  2:03 AM ED from 07/01/2021 in [facility] Most recent reading at 07/01/2021  4:58 PM ED from 06/17/2021 in [facility]  Most recent reading at 06/17/2021 11:05 AM  C-SSRS RISK CATEGORY Low Risk Moderate Risk No Risk       Last PHQ 2/9 Scores:     No data to display          Scribe for Treatment Team: Darleen Crocker, Latanya Presser 07/02/2021 10:45 AM

## 2021-07-03 DIAGNOSIS — F3181 Bipolar II disorder: Principal | ICD-10-CM

## 2021-07-03 MED ORDER — NICOTINE 21 MG/24HR TD PT24
21.0000 mg | MEDICATED_PATCH | Freq: Every day | TRANSDERMAL | Status: DC
  Administered 2021-07-03 – 2021-07-05 (×3): 21 mg via TRANSDERMAL
  Filled 2021-07-03 (×5): qty 1

## 2021-07-03 MED ORDER — TRAZODONE HCL 50 MG PO TABS: 50.0000 mg | ORAL_TABLET | Freq: Every evening | ORAL | Status: DC | PRN

## 2021-07-03 MED ORDER — TRAZODONE HCL 50 MG PO TABS
50.0000 mg | ORAL_TABLET | Freq: Every evening | ORAL | Status: DC | PRN
  Administered 2021-07-03: 50 mg via ORAL
  Filled 2021-07-03: qty 1

## 2021-07-03 MED ORDER — ARIPIPRAZOLE 5 MG PO TABS
5.0000 mg | ORAL_TABLET | Freq: Every day | ORAL | Status: DC
  Filled 2021-07-03: qty 1

## 2021-07-03 NOTE — BHH Group Notes (Signed)
.  Psychoeducational Group Note  Date: 07/03/2021 Time: 0900-1000    Goal Setting   Purpose of Group: This group helps to provide patients with the steps of setting a goal that is specific, measurable, attainable, realistic and time specific. A discussion on how we keep ourselves stuck with negative self talk. Homework given for Patients to write 30 positive attributes about themselves.    Participation Level:  did not attend [facility] A 

## 2021-07-03 NOTE — Progress Notes (Signed)
[facility] MD Progress Note  07/03/2021 1:23 PM Emily Golden  MRN:  [id number]   Subjective:  Edris reports: "I am sleepy and drowsy, and tired. I was lightheaded and nauseous. I don't know if it has to do with the medicine, but I slept good last night."  Reason For Admission: Patient is a 21 year old female with a reported psychiatric history of "depression, bipolar, generalized anxiety disorder, PTSD, ADD, and dyslexia" who was admitted to the psychiatric hospital from the [facility] for worsening depression and suicidal thoughts.   Today's patient assessment note: Patient's chart reviewed, her case discussed with the treatment team. HR earlier this morning was elevated at 121. Pt complained of lightheadedness and nausea earlier in the morning, but she is reporting feeling much better at this time. BP is currently 125/85, and HR is 92. Fluids are being encouraged.  As per the [facility], pt has been medication compliant, and Atarax 25 mg and Trazodone 50 mg were given to her last night for insomnia. As per flow sheets, pt slept a total of 6 hrs last night. Note placed on Trazodone, for nursing not to administer along side Hydroxyzine due to complaints of drowsiness in the mornings. Will move Abilify to night time starting 7/2 due to complaints of drowsiness in the mornings. Will continue other medications as listed below.  Today, pt presents with a depressed mood & affect is congruent. Her attention to personal hygiene and grooming is fair, eye contact is good, speech is clear & coherent. Thought contents are organized and logical, and pt currently endorses passive SI, states that it would be okay if she goes to sleep and does not wake up. She also reports still feeling hopeless, but verbally contracts for safety here on the unit. She denies HI/AVH, but presents with paranoia, and states that she feels as though people are out to get her because people have always done things to her all of her life. She reports feeling safe  here in the hospital. She reports a good sleep quality last night, and reports a fair appetite.  Principal Problem: Bipolar 2 disorder (HCC) Diagnosis: Principal Problem:   Bipolar 2 disorder (HCC) Active Problems:   PTSD (post-traumatic stress disorder)   GAD (generalized anxiety disorder)   ADHD   Tobacco use disorder   Alcohol consumption binge drinking  Total Time spent with patient: 30 minutes  Past Psychiatric History: As above  Past Medical History:  Past Medical History:  Diagnosis Date   Allergy    Anxiety    Asthma     Past Surgical History:  Procedure Laterality Date   NASAL RECONSTRUCTION     Family History:  Family History  Problem Relation Age of Onset   Diabetes Maternal Grandmother    Hypertension Maternal Grandmother    Hyperlipidemia Maternal Grandmother    Heart disease Maternal Grandfather    Cancer Maternal Grandfather    Diabetes Maternal Grandfather    Heart failure Neg Hx    Family Psychiatric  History: none reported Social History:  Social History   Substance and Sexual Activity  Alcohol Use Yes   Alcohol/week: 6.0 standard drinks of alcohol   Types: 6 Glasses of wine per week   Comment: Reports recent binge drinking of 7-9 drinks daily for the past 3 days;     Social History   Substance and Sexual Activity  Drug Use Not Currently   Types: Marijuana   Comment: cannabis use, last use 6 months ago    Social History  Socioeconomic History   Marital status: Married    Spouse name: Not on file   Number of children: Not on file   Years of education: Not on file   Highest education level: Not on file  Occupational History   Not on file  Tobacco Use   Smoking status: Former    Types: Cigarettes    Quit date: 04/09/2018    Years since quitting: 3.2   Smokeless tobacco: Never   Tobacco comments:    Says no smoking for 2 months  Vaping Use   Vaping Use: Every day  Substance and Sexual Activity   Alcohol use: Yes     Alcohol/week: 6.0 standard drinks of alcohol    Types: 6 Glasses of wine per week    Comment: Reports recent binge drinking of 7-9 drinks daily for the past 3 days;   Drug use: Not Currently    Types: Marijuana    Comment: cannabis use, last use 6 months ago   Sexual activity: Not Currently    Birth control/protection: Abstinence  Other Topics Concern   Not on file  Social History Narrative   Is in 10th grade at [employer]   Social Determinants of Health   Financial Resource Strain: Not on file  Food Insecurity: Not on file  Transportation Needs: Not on file  Physical Activity: Not on file  Stress: Not on file  Social Connections: Not on file   Additional Social History:     Sleep: Good  Appetite:  Fair  Current Medications: Current Facility-Administered Medications  Medication Dose Route Frequency Provider Last Rate Last Admin   acetaminophen (TYLENOL) tablet 650 mg  650 mg Oral Q6H PRN Lenard Lance, FNP       albuterol (VENTOLIN HFA) 108 (90 Base) MCG/ACT inhaler 1-2 puff  1-2 puff Inhalation Q4H PRN Massengill, Harrold Donath, MD       alum & mag hydroxide-simeth (MAALOX/MYLANTA) 200-200-20 MG/5ML suspension 30 mL  30 mL Oral Q4H PRN Lenard Lance, FNP       [START ON 07/04/2021] ARIPiprazole (ABILIFY) tablet 5 mg  5 mg Oral QHS Aishia Barkey, NP       hydrOXYzine (ATARAX) tablet 25 mg  25 mg Oral TID PRN Lenard Lance, FNP   25 mg at 07/02/21 2124   magnesium hydroxide (MILK OF MAGNESIA) suspension 30 mL  30 mL Oral Daily PRN Lenard Lance, FNP       nicotine (NICODERM CQ - dosed in mg/24 hours) patch 21 mg  21 mg Transdermal Daily Kru Allman, NP   21 mg at 07/03/21 0845   traZODone (DESYREL) tablet 50 mg  50 mg Oral QHS PRN Starleen Blue, NP        Lab Results:  Results for orders placed or performed during the hospital encounter of 07/01/21 (from the past 48 hour(s))  Resp Panel by RT-PCR (Flu A&B, Covid) Anterior Nasal Swab     Status: None    Collection Time: 07/01/21  4:02 PM   Specimen: Anterior Nasal Swab  Result Value Ref Range   SARS Coronavirus 2 by RT PCR NEGATIVE NEGATIVE    Comment: (NOTE) SARS-CoV-2 target nucleic acids are NOT DETECTED.  The SARS-CoV-2 RNA is generally detectable in upper respiratory specimens during the acute phase of infection. The lowest concentration of SARS-CoV-2 viral copies this assay can detect is 138 copies/mL. A negative result does not preclude SARS-Cov-2 infection and should not be used as the sole basis for  treatment or other patient management decisions. A negative result may occur with  improper specimen collection/handling, submission of specimen other than nasopharyngeal swab, presence of viral mutation(s) within the areas targeted by this assay, and inadequate number of viral copies(<138 copies/mL). A negative result must be combined with clinical observations, patient history, and epidemiological information. The expected result is Negative.  Fact Sheet for Patients:  BloggerCourse.com  Fact Sheet for Healthcare Providers:  SeriousBroker.it  This test is no t yet approved or cleared by the [place] FDA and  has been authorized for detection and/or diagnosis of SARS-CoV-2 by FDA under an Emergency Use Authorization (EUA). This EUA will remain  in effect (meaning this test can be used) for the duration of the COVID-19 declaration under Section 564(b)(1) of the Act, 21 U.S.C.section 360bbb-3(b)(1), unless the authorization is terminated  or revoked sooner.       Influenza A by PCR NEGATIVE NEGATIVE   Influenza B by PCR NEGATIVE NEGATIVE    Comment: (NOTE) The Xpert Xpress SARS-CoV-2/FLU/RSV plus assay is intended as an aid in the diagnosis of influenza from Nasopharyngeal swab specimens and should not be used as a sole basis for treatment. Nasal washings and aspirates are unacceptable for Xpert Xpress  SARS-CoV-2/FLU/RSV testing.  Fact Sheet for Patients: BloggerCourse.com  Fact Sheet for Healthcare Providers: SeriousBroker.it  This test is not yet approved or cleared by the [place] FDA and has been authorized for detection and/or diagnosis of SARS-CoV-2 by FDA under an Emergency Use Authorization (EUA). This EUA will remain in effect (meaning this test can be used) for the duration of the COVID-19 declaration under Section 564(b)(1) of the Act, 21 U.S.C. section 360bbb-3(b)(1), unless the authorization is terminated or revoked.  Performed at [facility], [address], [place], [place] [address]   CBC with Differential/Platelet     Status: None   Collection Time: 07/01/21  4:38 PM  Result Value Ref Range   WBC 7.6 4.0 - 10.5 K/uL   RBC 4.68 3.87 - 5.11 MIL/uL   Hemoglobin 12.9 12.0 - 15.0 g/dL   HCT 63.8 46.6 - 59.9 %   MCV 85.5 80.0 - 100.0 fL   MCH 27.6 26.0 - 34.0 pg   MCHC 32.3 30.0 - 36.0 g/dL   RDW 35.7 01.7 - 79.3 %   Platelets 278 150 - 400 K/uL   nRBC 0.0 0.0 - 0.2 %   Neutrophils Relative % 63 %   Neutro Abs 4.9 1.7 - 7.7 K/uL   Lymphocytes Relative 28 %   Lymphs Abs 2.1 0.7 - 4.0 K/uL   Monocytes Relative 7 %   Monocytes Absolute 0.5 0.1 - 1.0 K/uL   Eosinophils Relative 1 %   Eosinophils Absolute 0.1 0.0 - 0.5 K/uL   Basophils Relative 1 %   Basophils Absolute 0.1 0.0 - 0.1 K/uL   Immature Granulocytes 0 %   Abs Immature Granulocytes 0.02 0.00 - 0.07 K/uL    Comment: Performed at [facility], [address] [place], [place] [address]  Comprehensive metabolic panel     Status: Abnormal   Collection Time: 07/01/21  4:38 PM  Result Value Ref Range   Sodium 140 135 - 145 mmol/L   Potassium 3.9 3.5 - 5.1 mmol/L   Chloride 108 98 - 111 mmol/L   CO2 23 22 - 32 mmol/L   Glucose, Bld 71 70 - 99 mg/dL    Comment: Glucose reference range applies only to samples taken after  fasting for at  least 8 hours.   BUN 7 6 - 20 mg/dL   Creatinine, Ser 1.610.64 0.44 - 1.00 mg/dL   Calcium 9.5 8.9 - 09.610.3 mg/dL   Total Protein 6.4 (L) 6.5 - 8.1 g/dL   Albumin 3.9 3.5 - 5.0 g/dL   AST 21 15 - 41 U/L   ALT 22 0 - 44 U/L   Alkaline Phosphatase 84 38 - 126 U/L   Total Bilirubin 0.8 0.3 - 1.2 mg/dL   GFR, Estimated >04>60 >54>60 mL/min    Comment: (NOTE) Calculated using the CKD-EPI Creatinine Equation (2021)    Anion gap 9 5 - 15    Comment: Performed at [facility], [address] [place], [place] [address]  Hemoglobin A1c     Status: None   Collection Time: 07/01/21  4:38 PM  Result Value Ref Range   Hgb A1c MFr Bld 4.8 4.8 - 5.6 %    Comment: (NOTE)         Prediabetes: 5.7 - 6.4         Diabetes: >6.4         Glycemic control for adults with diabetes: <7.0    Mean Plasma Glucose 91 mg/dL    Comment: (NOTE) Performed At: [facility] [address] [place], [place] [id number] Jolene SchimkeNagendra Sanjai MD OZ:[id number]:[phone]   Magnesium     Status: None   Collection Time: 07/01/21  4:38 PM  Result Value Ref Range   Magnesium 2.1 1.7 - 2.4 mg/dL    Comment: Performed at [facility], [address] [place], [place] [address]  Ethanol     Status: None   Collection Time: 07/01/21  4:38 PM  Result Value Ref Range   Alcohol, Ethyl (B) <10 <10 mg/dL    Comment: (NOTE) Lowest detectable limit for serum alcohol is 10 mg/dL.  For medical purposes only. Performed at [facility], [address] [place], [place] [address]   Lipid panel     Status: Abnormal   Collection Time: 07/01/21  4:38 PM  Result Value Ref Range   Cholesterol 159 0 - 200 mg/dL   Triglycerides 76 <440<150 mg/dL   HDL 42 >10>40 mg/dL   Total CHOL/HDL Ratio 3.8 RATIO   VLDL 15 0 - 40 mg/dL   LDL Cholesterol 272102 (H) 0 - 99 mg/dL    Comment:        Total Cholesterol/HDL:CHD Risk Coronary Heart Disease Risk Table                     Men   Women  1/2 Average Risk   3.4   3.3  Average Risk       5.0   4.4   2 X Average Risk   9.6   7.1  3 X Average Risk  23.4   11.0        Use the calculated Patient Ratio above and the CHD Risk Table to determine the patient's CHD Risk.        ATP III CLASSIFICATION (LDL):  <100     mg/dL   Optimal  [id number]  mg/dL   Near or Above                    Optimal  130-159  mg/dL   Borderline  [id number]  mg/dL   High  >595>190     mg/dL   Very High Performed at [facility], [address], [place], [place] [address]   TSH     Status: None   Collection Time: 07/01/21  4:38 PM  Result Value Ref Range   TSH 1.093 0.350 - 4.500 uIU/mL    Comment: Performed by a 3rd Generation assay with a functional sensitivity of <=0.01 uIU/mL. Performed at [facility], [address], [place], [place] [address]   Prolactin     Status: None   Collection Time: 07/01/21  4:38 PM  Result Value Ref Range   Prolactin 8.2 4.8 - 23.3 ng/mL    Comment: (NOTE) Performed At: [facility] [address] [place], [place] [id number] Jolene Schimke MD JQ:[id number]   POCT Urine Drug Screen - (I-Screen)     Status: Normal   Collection Time: 07/01/21  5:04 PM  Result Value Ref Range   POC Amphetamine UR None Detected NONE DETECTED (Cut Off Level 1000 ng/mL)   POC Secobarbital (BAR) None Detected NONE DETECTED (Cut Off Level 300 ng/mL)   POC Buprenorphine (BUP) None Detected NONE DETECTED (Cut Off Level 10 ng/mL)   POC Oxazepam (BZO) None Detected NONE DETECTED (Cut Off Level 300 ng/mL)   POC Cocaine UR None Detected NONE DETECTED (Cut Off Level 300 ng/mL)   POC Methamphetamine UR None Detected NONE DETECTED (Cut Off Level 1000 ng/mL)   POC Morphine None Detected NONE DETECTED (Cut Off Level 300 ng/mL)   POC Methadone UR None Detected NONE DETECTED (Cut Off Level 300 ng/mL)   POC Oxycodone UR None Detected NONE DETECTED (Cut Off Level 100 ng/mL)   POC Marijuana UR None Detected NONE DETECTED (Cut Off Level 50 ng/mL)  Pregnancy, urine POC     Status: None   Collection  Time: 07/01/21  5:20 PM  Result Value Ref Range   Preg Test, Ur NEGATIVE NEGATIVE    Comment:        THE SENSITIVITY OF THIS METHODOLOGY IS >24 mIU/mL   POC SARS Coronavirus 2 Ag     Status: None   Collection Time: 07/01/21  5:21 PM  Result Value Ref Range   SARSCOV2ONAVIRUS 2 AG NEGATIVE NEGATIVE    Comment: (NOTE) SARS-CoV-2 antigen NOT DETECTED.   Negative results are presumptive.  Negative results do not preclude SARS-CoV-2 infection and should not be used as the sole basis for treatment or other patient management decisions, including infection  control decisions, particularly in the presence of clinical signs and  symptoms consistent with COVID-19, or in those who have been in contact with the virus.  Negative results must be combined with clinical observations, patient history, and epidemiological information. The expected result is Negative.  Fact Sheet for Patients: https://www.jennings-kim.com/  Fact Sheet for Healthcare Providers: https://alexander-rogers.biz/  This test is not yet approved or cleared by the [place] FDA and  has been authorized for detection and/or diagnosis of SARS-CoV-2 by FDA under an Emergency Use Authorization (EUA).  This EUA will remain in effect (meaning this test can be used) for the duration of  the COV ID-19 declaration under Section 564(b)(1) of the Act, 21 U.S.C. section 360bbb-3(b)(1), unless the authorization is terminated or revoked sooner.      Blood Alcohol level:  Lab Results  Component Value Date   [facility] <10 07/01/2021   ETH <10 06/07/2018    Metabolic Disorder Labs: Lab Results  Component Value Date   HGBA1C 4.8 07/01/2021   MPG 91 07/01/2021   MPG 88.19 06/11/2018   Lab Results  Component Value Date  PROLACTIN 8.2 07/01/2021   PROLACTIN 30.7 (H) 06/11/2018   Lab Results  Component Value Date   CHOL 159 07/01/2021   TRIG 76 07/01/2021   HDL 42 07/01/2021   CHOLHDL 3.8  07/01/2021   VLDL 15 07/01/2021   LDLCALC 102 (H) 07/01/2021   LDLCALC 94 06/11/2018    Physical Findings: AIMS:  , ,  ,  ,    CIWA:    COWS:     Musculoskeletal: Strength & Muscle Tone: within normal limits Gait & Station: normal Patient leans: N/A  Psychiatric Specialty Exam:  Presentation  General Appearance: Appropriate for Environment; Fairly Groomed  Eye Contact:Good  Speech:Clear and Coherent  Speech Volume:Normal  Handedness:Right   Mood and Affect  Mood:Depressed  Affect:Non-Congruent; Full Range   Thought Process  Thought Processes:Coherent  Descriptions of Associations:Intact  Orientation:Full (Time, Place and Person)  Thought Content:Logical  History of Schizophrenia/Schizoaffective disorder:No  Duration of Psychotic Symptoms:No data recorded Hallucinations:Hallucinations: None  Ideas of Reference:Paranoia  Suicidal Thoughts:Suicidal Thoughts: Yes, Passive SI Passive Intent and/or Plan: Without Intent; Without Plan  Homicidal Thoughts:Homicidal Thoughts: No   Sensorium  Memory:Immediate Good  Judgment:Fair  Insight:Fair   Executive Functions  Concentration:Fair  Attention Span:Fair  Recall:Fair  Fund of Knowledge:Fair  Language:Fair   Psychomotor Activity  Psychomotor Activity:Psychomotor Activity: Normal   Assets  Assets:Communication Skills   Sleep  Sleep:Sleep: Fair    Physical Exam: Physical Exam Constitutional:      Appearance: She is obese.  HENT:     Head: Normocephalic.     Nose: Nose normal. No congestion or rhinorrhea.  Eyes:     Pupils: Pupils are equal, round, and reactive to light.  Pulmonary:     Effort: Pulmonary effort is normal.  Musculoskeletal:        General: Normal range of motion.     Cervical back: Normal range of motion.  Neurological:     Mental Status: She is alert and oriented to person, place, and time.  Psychiatric:        Behavior: Behavior normal.        Thought  Content: Thought content normal.    Review of Systems  Constitutional: Negative.  Negative for fever.  HENT: Negative.  Negative for sore throat.   Eyes: Negative.   Respiratory: Negative.    Cardiovascular: Negative.  Negative for chest pain.  Gastrointestinal: Negative.   Genitourinary: Negative.   Musculoskeletal: Negative.   Skin: Negative.   Psychiatric/Behavioral:  Positive for depression and suicidal ideas. Negative for hallucinations, memory loss and substance abuse. The patient is nervous/anxious and has insomnia.    Blood pressure 125/85, pulse 92, temperature 97.8 F (36.6 C), temperature source Oral, resp. rate 16, height 5' 7.5" (1.715 m), weight 121 kg, last menstrual period 06/29/2021, SpO2 97 %. Body mass index is 41.17 kg/m.  PLAN: Safety and Monitoring:             --  Voluntary admission to inpatient psychiatric unit for safety, stabilization and treatment             -- Daily contact with patient to assess and evaluate symptoms and progress in treatment             -- Patient's case to be discussed in multi-disciplinary team meeting             -- Observation Level : q15 minute checks             -- Vital signs:  q12 hours             --  Precautions: Safety   2. Psychiatric Diagnoses and Treatment:               -Continue Abilify 5 mg for mood stabilization, & change to nighttime d/t c/o daytime sedation -Continue Trazodone 50 mg nightly PRN for insomnia -Continue Atarax 35 mg PRN TID for anxiety -Continue Albuterol Q4 H for wheezing/SOB -Continue other PRNS as per MAR (Tylenol/MOM/Maalox for pain, constipation, and indigestion respectively)-See MAR    -- The risks/benefits/side-effects/alternatives to this medication were discussed in detail with the patient and time was given for questions. The patient consents to medication trial.                -- Metabolic profile and EKG monitoring obtained while on an atypical antipsychotic (BMI: Lipid Panel: HbgA1c:  QTc:)              -- Encouraged patient to participate in unit milieu and in scheduled group therapies              -- Short Term Goals: Ability to identify changes in lifestyle to reduce recurrence of condition will improve, Ability to verbalize feelings will improve, Ability to disclose and discuss suicidal ideas, Ability to demonstrate self-control will improve, Ability to identify and develop effective coping behaviors will improve, Ability to maintain clinical measurements within normal limits will improve, Compliance with prescribed medications will improve, and Ability to identify triggers associated with substance abuse/mental health issues will improve             -- Long Term Goals: Improvement in symptoms so as ready for discharge                3. Medical Issues Being Addressed: none currently    Tobacco (Vaping) use disorder-continue nicotine patch, consider outpatient tobacco cessation as vaping is negatively contributing to anxiety and mood   4. Discharge Planning:              -- Social work and case management to assist with discharge planning and identification of hospital follow-up needs prior to discharge             -- Estimated LOS: 5-7 days             -- Discharge Concerns: Need to establish a safety plan; Medication compliance and effectiveness             -- Discharge Goals: Return home with outpatient referrals for mental health follow-up including medication management/psychotherapy    Starleen Blue, NP 07/03/2021, 1:23 PM

## 2021-07-03 NOTE — Plan of Care (Signed)
  Problem: Activity: Goal: Interest or engagement in activities will improve Outcome: Progressing   Problem: Coping: Goal: Ability to verbalize frustrations and anger appropriately will improve Outcome: Progressing   Problem: Coping: Goal: Ability to demonstrate self-control will improve Outcome: Progressing   Problem: Health Behavior/Discharge Planning: Goal: Compliance with treatment plan for underlying cause of condition will improve Outcome: Progressing   Problem: Safety: Goal: Periods of time without injury will increase Outcome: Progressing   Problem: Medication: Goal: Compliance with prescribed medication regimen will improve Outcome: Progressing

## 2021-07-03 NOTE — Progress Notes (Signed)
   07/03/21 0935  Psych Admission Type (Psych Patients Only)  Admission Status Voluntary  Psychosocial Assessment  Patient Complaints Depression  Eye Contact Fair  Facial Expression Flat  Affect Appropriate to circumstance  Speech Logical/coherent  Interaction Assertive  Motor Activity Other (Comment) (WNL)  Appearance/Hygiene Unremarkable  Behavior Characteristics Cooperative  Mood Pleasant;Euthymic  Thought Process  Coherency WDL  Content WDL  Delusions None reported or observed  Perception WDL  Hallucination None reported or observed  Judgment Poor  Confusion None  Danger to Self  Current suicidal ideation? Denies  Self-Injurious Behavior No self-injurious ideation or behavior indicators observed or expressed   Agreement Not to Harm Self Yes  Description of Agreement verbal  Danger to Others  Danger to Others None reported or observed

## 2021-07-03 NOTE — [facility] Group Notes (Signed)
The focus of this group is to help patients review their daily goal of treatment and discuss progress on daily workbooks.   

## 2021-07-03 NOTE — Group Note (Signed)
[facility] LCSW Group Therapy Note  Date/Time:    07/03/2021 10:00-11:00AM  Type of Therapy and Topic:  Group Therapy:  Shame and its Impact on My Life  Participation Level:  Active   Description of Group:  The focus of this group was to examine our tendency to be hyper-critical of self and how this leads to feelings of worthlessness, hopelessness, and shame.  Patients were guided to the concept that shame is universal and is worsened by being kept hidden, but improved by being revealed.  We discussed how feeling unworthy is the result of shame and discussed the differences between guilt  ("I did something bad" "I made a mistake" "I did something stupid") and shame ("I am bad" "I am a mistake" "I am stupid") .  We discussed that feelings are not necessarily based in facts.  We also talked about what happens when we do something to numb our negative feelings and how that actually numbs our positive feelings at the same time.  Therapeutic Goals Identify statements patients automatically say to themselves, "I'll be worthy when...."  Examine how this is unkind to ourselves because it indicates we cannot be worthy until some far-reaching, possibly even unreachable, goal is achieved. Talk about the frequent use of unhealthy coping skills used when feeling unworthy Allow patients to discuss their shame out loud in order to reduce its power over them  Summary of Patient Progress: During group, patient expressed "I'll be worthy when I can learn to trust myself."   She participated fully, talked about her childhood being really difficult, having an incident with her mother recently where her mother denied that bad things had happened to the patient during her childhood.  She was open in sharing, was supportive of others.  Therapeutic Modalities Processing  Ambrose Mantle, LCSW 07/03/2021, 1:37 PM

## 2021-07-03 NOTE — [facility] Group Notes (Signed)
.  Psychoeducational Group Note    Date:  7///23 Time: 1300-1400    Purpose of Group: . The group focus' on teaching patients on how to identify their needs and their Life Skills:  A group where two lists are made. What people need and what are things that we do that are unhealthy. The lists are developed by the patients and it is explained that we often do the actions that are not healthy to get our list of needs met.  Goal:: to develop the coping skills needed to get their needs met  Participation Level:  Active  Participation Quality:  Appropriate  Affect:  Appropriate  Cognitive:  Oriented  Insight: Improving  Engagement in Group:  Engaged  Modes of Intervention:  Activity, Discussion, Education, and Support  Additional Comments:  Pt rates her energy at a 6/10. Participated fully in the group.  Emily Golden

## 2021-07-03 NOTE — Progress Notes (Signed)
Pt attended AA meeting.  

## 2021-07-04 MED ORDER — ARIPIPRAZOLE 15 MG PO TABS
7.5000 mg | ORAL_TABLET | Freq: Every day | ORAL | Status: DC
  Administered 2021-07-04 – 2021-07-05 (×2): 7.5 mg via ORAL
  Filled 2021-07-04: qty 1
  Filled 2021-07-04: qty 4
  Filled 2021-07-04 (×2): qty 1

## 2021-07-04 MED ORDER — TRAZODONE HCL 50 MG PO TABS
50.0000 mg | ORAL_TABLET | Freq: Every day | ORAL | Status: DC
  Administered 2021-07-04: 50 mg via ORAL
  Filled 2021-07-04 (×2): qty 1
  Filled 2021-07-04: qty 7

## 2021-07-04 MED ORDER — HYDROXYZINE HCL 25 MG PO TABS
25.0000 mg | ORAL_TABLET | Freq: Every day | ORAL | Status: DC
  Administered 2021-07-04: 25 mg via ORAL
  Filled 2021-07-04 (×3): qty 1

## 2021-07-04 NOTE — [facility] Group Notes (Signed)
Adult Psychoeducational Group Note Date:  07/04/2021 Time:  0900-1045 Group Topic/Focus: PROGRESSIVE RELAXATION. A group where deep breathing is taught and tensing and relaxation muscle groups is used. Imagery is used as well.  Pts are asked to imagine 3 pillars that hold them up when they are not able to hold themselves up and to share that with the group.  Participation Level:  Active  Participation Quality:  Appropriate  Affect:  Appropriate  Cognitive:  Oriented  Insight: Improving  Engagement in Group:  Engaged  Modes of Intervention:  Activity, Discussion, Education, and Support  Additional Comments:  Estee rates her energy as a 3/10. Pt was removed from the group by the Doctor.  Dione Housekeeper

## 2021-07-04 NOTE — [facility] Suicide Risk Assessment (Signed)
[facility] INPATIENT:  Family/Significant Other Suicide Prevention Education  Suicide Prevention Education:  Education Completed; Teodoro Spray, spouse has been identified by the patient as the family member/significant other with whom the patient will be residing, and identified as the person(s) who will aid the patient in the event of a mental health crisis (suicidal ideations/suicide attempt).  With written consent from the patient, the family member/significant other has been provided the following suicide prevention education, prior to the and/or following the discharge of the patient.  In addition to the precautions below, patient's support agrees to monitor patient for safety, ensure treatment compliance, and alert emergency services should patient require such services. States all weapons are secured and he intends on dispersing medications for some period of time directly following hospitalization.   The suicide prevention education provided includes the following: Suicide risk factors Suicide prevention and interventions National Suicide Hotline telephone number [facility] assessment telephone number [facility] Emergency Assistance 911 [facility] and/or Residential Mobile Crisis Unit telephone number  Request made of family/significant other to: Remove weapons (e.g., guns, rifles, knives), all items previously/currently identified as safety concern.   Remove drugs/medications (over-the-counter, prescriptions, illicit drugs), all items previously/currently identified as a safety concern.  The family member/significant other verbalizes understanding of the suicide prevention education information provided.  The family member/significant other agrees to remove the items of safety concern listed above.  Corky Crafts 07/04/2021, 3:34 PM

## 2021-07-04 NOTE — [facility] Group Notes (Signed)
Adult Psychoeducational Group  Date:  07/04/2021 Time:  1300-1400  Group Topic/Focus: Continuation of the group from Saturday. Looking at the lists that were created and talking about what needs to be done with the homework of 30 positives about themselves.                                     Talking about taking their power back and helping themselves to develop a positive self esteem.      Participation Quality:  Appropriate  Affect:  Appropriate  Cognitive:  Oriented  Insight: Improving  Engagement in Group:  Engaged  Modes of Intervention:  Activity, Discussion, Education, and Support  Additional Comments:  rates her energy at a 5/10. Participated in the group  Vira Blanco A

## 2021-07-04 NOTE — Progress Notes (Signed)
Pt was upset this morning because she was confused about what medications she was "supposed to take", but pt calmed down after she met with provider this morning.  Pt has been pleasant and interactive in group discussions. Pt given prn vistaril for anxiety which reduced pt's symptoms.RN will continue to monitor pt's progress and provide support as needed.

## 2021-07-04 NOTE — Progress Notes (Signed)
Pt has been very vocal while in wrap up and said her day was a 10 out of 10.

## 2021-07-04 NOTE — Progress Notes (Signed)
   07/04/21 0024  Psych Admission Type (Psych Patients Only)  Admission Status Voluntary  Psychosocial Assessment  Patient Complaints Anxiety  Eye Contact Fair  Facial Expression Anxious  Affect Appropriate to circumstance  Speech Logical/coherent  Interaction Assertive  Motor Activity Other (Comment) (wdl)  Appearance/Hygiene Unremarkable  Behavior Characteristics Cooperative  Mood Pleasant  Thought Process  Coherency WDL  Content WDL  Delusions None reported or observed  Perception WDL  Hallucination None reported or observed  Judgment Impaired  Confusion None  Danger to Self  Current suicidal ideation? Denies  Self-Injurious Behavior No self-injurious ideation or behavior indicators observed or expressed   Agreement Not to Harm Self Yes  Description of Agreement verbal contract  Danger to Others  Danger to Others None reported or observed   D: Patient in dayroom reports she had a good day and is able to engage therapeutically. A: Medications administered as prescribed. Support and encouragement provided as needed.  R: Patient remains safe on the unit. Will continue to monitor for safety and stability.

## 2021-07-04 NOTE — Group Note (Signed)
[facility] LCSW Group Therapy Note  07/04/2021  10:00-11:00AM  Type of Therapy and Topic:  Group Therapy:  Adding Supports Including Myself  Participation Level:  Active   Description of Group:  Patients in this group were introduced to the differences between healthy supports and unheathy supports.  This led to a lengthy and emotional discussion about how to set boundaries, particularly with family members.  Many in group expressed that they put other people before themselves.  The group discussed that this not only hurts them, but ultimately ends up hurting the people they want to help.  Examples were given about how to set boundaries one at a time rather than merely cutting people off.  A song entitled "My Own Hero" was played.  A group discussion ensued in which patients stated they could relate to the song and it inspired them to realize they have be willing to help themselves in order to succeed, because other people cannot achieve sobriety or stability for them.  We discussed adding a variety of healthy supports to address the various needs in our lives.    Therapeutic Goals: 1)  demonstrate the importance of being a part of one's own support system by asking for and accepting help 2)  discuss reasons people in one's life may eventually be unable to be continually supportive  3)  identify the patient's current support system and   4)  elicit commitments to add healthy supports and to become more conscious of being self-supportive   Summary of Patient Progress:  The patient expressed her healthy support(s) right now include "Mimi" and big brother while unhealthy supports include her mother and some friends.  The patient's overall reaction to this topic was positive.  She raised a lot of questions about how to set barriers with people who she believes should come first (I.e. before her) in her life.  She did listen attentively to all responses..  Therapeutic Modalities:   Motivational  Interviewing Activity  Lynnell Chad

## 2021-07-04 NOTE — Progress Notes (Signed)
[facility] MD Progress Note  07/04/2021 10:00 AM Emily Golden  MRN:  [id number]    Subjective:   Patient is a 21 year old female with a reported psychiatric history of "depression, bipolar, generalized anxiety disorder, PTSD, ADD, and dyslexia" who was admitted to the psychiatric hospital from the [facility] for worsening depression and suicidal thoughts.  Diagnoses provided on admission: -Bipolar disorder, type 2, current mood episode is depressed, without psychotic features -GAD -PTSD -ADHD -Tobacco use d/o  -R/o Borderline PD   On my assessment today, the patient reports her mood is still dysphoric and irritable.  She reports frustration due to a lot of external stressors.  She has low frustration tolerance.  Reports that anxiety continues to be elevated, especially last night and this morning due to external stressors.  She reports that appetite is okay.  Sleep was poor due to anxiety last night. She reports that suicidal thoughts, continue, passive, without intent or plan, but less frequent since admission.  Denies HI.  Denies psychotic symptoms. Patient is agreeable to moving Abilify to the morning, increasing the dose of Abilify, and scheduling trazodone and hydroxyzine for sleep.   She otherwise denies having any side effects to current scheduled psychiatric medications.  We also discussed her alcohol use.  Patient believes that alcohol use is not a problem for now but could be.  She reports strong paternal genetic predisposition for alcoholism.  She reports that she can sometimes craves alcohol.  We discussed complete abstinence, due to her genetic predisposition, and her symptoms preserved very high risk of developing alcohol use disorder.   She is also open to a DBT referral.  Principal Problem: Bipolar 2 disorder (HCC) Diagnosis: Principal Problem:   Bipolar 2 disorder (HCC) Active Problems:   PTSD (post-traumatic stress disorder)   GAD (generalized anxiety disorder)   ADHD   Tobacco  use disorder   Alcohol consumption binge drinking  Total Time spent with patient: 20 minutes  Past Psychiatric History:  Dx: BP vs MDD, GAD, PTSD, ADHD  Patient reports being diagnosed with ADD and dyslexia in first grade 2020 at age 72 previous [facility] admission suicide attempt by overdose 2018 at age 24 previous [facility] admission 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, self harm; diagnosed with bipolar and anxiety; later diagnosed with PTSD in outpatient Current psychiatric medications: None Past psychiatric medication history: Lexapro, prozac, zoloft, BuSpar   Past Medical History:  Past Medical History:  Diagnosis Date   Allergy    Anxiety    Asthma     Past Surgical History:  Procedure Laterality Date   NASAL RECONSTRUCTION     Family History:  Family History  Problem Relation Age of Onset   Diabetes Maternal Grandmother    Hypertension Maternal Grandmother    Hyperlipidemia Maternal Grandmother    Heart disease Maternal Grandfather    Cancer Maternal Grandfather    Diabetes Maternal Grandfather    Heart failure Neg Hx    Family Psychiatric  History:  father has bipolar, schizophrenia, anxiety Mother has anxiety Great-grandmother (mom's side) has schizophrenia Father and female members of paternal side attempted suicide  Social history: currently married  Social History:  Social History   Substance and Sexual Activity  Alcohol Use Yes   Alcohol/week: 6.0 standard drinks of alcohol   Types: 6 Glasses of wine per week   Comment: Reports recent binge drinking of 7-9 drinks daily for the past 3 days;     Social History   Substance and Sexual Activity  Drug Use Not  Currently   Types: Marijuana   Comment: cannabis use, last use 6 months ago    Social History   Socioeconomic History   Marital status: Married    Spouse name: Not on file   Number of children: Not on file   Years of education: Not on file   Highest education level: Not on file  Occupational History   Not  on file  Tobacco Use   Smoking status: Former    Types: Cigarettes    Quit date: 04/09/2018    Years since quitting: 3.2   Smokeless tobacco: Never   Tobacco comments:    Says no smoking for 2 months  Vaping Use   Vaping Use: Every day  Substance and Sexual Activity   Alcohol use: Yes    Alcohol/week: 6.0 standard drinks of alcohol    Types: 6 Glasses of wine per week    Comment: Reports recent binge drinking of 7-9 drinks daily for the past 3 days;   Drug use: Not Currently    Types: Marijuana    Comment: cannabis use, last use 6 months ago   Sexual activity: Not Currently    Birth control/protection: Abstinence  Other Topics Concern   Not on file  Social History Narrative   Is in 10th grade at [employer]   Social Determinants of Health   Financial Resource Strain: Not on file  Food Insecurity: Not on file  Transportation Needs: Not on file  Physical Activity: Not on file  Stress: Not on file  Social Connections: Not on file   Additional Social History:                         Sleep: Poor  Appetite:  Fair  Current Medications: Current Facility-Administered Medications  Medication Dose Route Frequency Provider Last Rate Last Admin   acetaminophen (TYLENOL) tablet 650 mg  650 mg Oral Q6H PRN Lenard Lance, FNP       albuterol (VENTOLIN HFA) 108 (90 Base) MCG/ACT inhaler 1-2 puff  1-2 puff Inhalation Q4H PRN Kassia Demarinis, Harrold Donath, MD       alum & mag hydroxide-simeth (MAALOX/MYLANTA) 200-200-20 MG/5ML suspension 30 mL  30 mL Oral Q4H PRN Lenard Lance, FNP       ARIPiprazole (ABILIFY) tablet 5 mg  5 mg Oral QHS Nkwenti, Doris, NP       hydrOXYzine (ATARAX) tablet 25 mg  25 mg Oral TID PRN Lenard Lance, FNP   25 mg at 07/02/21 2124   magnesium hydroxide (MILK OF MAGNESIA) suspension 30 mL  30 mL Oral Daily PRN Lenard Lance, FNP       nicotine (NICODERM CQ - dosed in mg/24 hours) patch 21 mg  21 mg Transdermal Daily Nkwenti, Doris, NP   21 mg  at 07/04/21 0903   traZODone (DESYREL) tablet 50 mg  50 mg Oral QHS PRN Starleen Blue, NP   50 mg at 07/03/21 2119    Lab Results: No results found for this or any previous visit (from the past 48 hour(s)).  Blood Alcohol level:  Lab Results  Component Value Date   [facility] <10 07/01/2021   ETH <10 06/07/2018    Metabolic Disorder Labs: Lab Results  Component Value Date   HGBA1C 4.8 07/01/2021   MPG 91 07/01/2021   MPG 88.19 06/11/2018   Lab Results  Component Value Date   PROLACTIN 8.2 07/01/2021   PROLACTIN 30.7 (H) 06/11/2018  Lab Results  Component Value Date   CHOL 159 07/01/2021   TRIG 76 07/01/2021   HDL 42 07/01/2021   CHOLHDL 3.8 07/01/2021   VLDL 15 07/01/2021   LDLCALC 102 (H) 07/01/2021   LDLCALC 94 06/11/2018    Physical Findings: AIMS:  , ,  ,  ,    CIWA:    COWS:     Musculoskeletal: Strength & Muscle Tone: within normal limits Gait & Station: normal Patient leans: N/A  Psychiatric Specialty Exam:  Presentation  General Appearance: Casual  Eye Contact:Good  Speech:Normal Rate  Speech Volume:Normal  Handedness:Right   Mood and Affect  Mood:Anxious; Depressed  Affect:Full Range   Thought Process  Thought Processes:Linear  Descriptions of Associations:Intact  Orientation:Full (Time, Place and Person)  Thought Content:Logical  History of Schizophrenia/Schizoaffective disorder:No  Duration of Psychotic Symptoms:No data recorded Hallucinations:Hallucinations: None  Ideas of Reference:None  Suicidal Thoughts:Suicidal Thoughts: Yes, Passive SI Passive Intent and/or Plan: Without Intent; Without Plan  Homicidal Thoughts:Homicidal Thoughts: No   Sensorium  Memory:Immediate Good; Recent Good; Remote Good  Judgment:Impaired  Insight:Shallow   Executive Functions  Concentration:Fair  Attention Span:Fair  Recall:Fair  Fund of Knowledge:Fair  Language:Fair   Psychomotor Activity  Psychomotor Activity:Psychomotor  Activity: Normal   Assets  Assets:Communication Skills   Sleep  Sleep:Sleep: Poor    Physical Exam: Physical Exam Vitals reviewed.  Pulmonary:     Effort: Pulmonary effort is normal.  Neurological:     Mental Status: She is alert.     Motor: No weakness.     Gait: Gait normal.    Review of Systems  Psychiatric/Behavioral:  Positive for depression and suicidal ideas. The patient is nervous/anxious and has insomnia.    Blood pressure 134/60, pulse 70, temperature 98.5 F (36.9 C), temperature source Oral, resp. rate 16, height 5' 7.5" (1.715 m), weight 121 kg, last menstrual period 06/29/2021, SpO2 94 %. Body mass index is 41.17 kg/m.   Treatment Plan Summary: Daily contact with patient to assess and evaluate symptoms and progress in treatment   ASSESSMENT:   Diagnoses / Active Problems: -Bipolar disorder, type 2, current mood episode is depressed, without psychotic features -GAD -PTSD -ADHD -Tobacco use d/o  -R/o Borderline PD    PLAN: Safety and Monitoring:             --  Voluntary admission to inpatient psychiatric unit for safety, stabilization and treatment             -- Daily contact with patient to assess and evaluate symptoms and progress in treatment             -- Patient's case to be discussed in multi-disciplinary team meeting             -- Observation Level : q15 minute checks             -- Vital signs:  q12 hours             -- Precautions: suicide, elopement, and assault   2. Psychiatric Diagnoses and Treatment:               -Increase Abilify from 5 mg to 7.5 mg daily. Move dose from qhs to morning, at pt request -Schedule trazodone 50 mg nightly.  We will continue 50 mg nightly as needed -Schedule hydroxyzine 25 mg nightly.  Continue as needed hydroxyzine      -- The risks/benefits/side-effects/alternatives to this medication were discussed in detail with the patient and time was  given for questions. The patient consents to medication  trial.                -- Metabolic profile and EKG monitoring obtained while on an atypical antipsychotic (BMI: Lipid Panel: HbgA1c: QTc:)              -- Encouraged patient to participate in unit milieu and in scheduled group therapies              -- Short Term Goals: Ability to identify changes in lifestyle to reduce recurrence of condition will improve, Ability to verbalize feelings will improve, Ability to disclose and discuss suicidal ideas, Ability to demonstrate self-control will improve, Ability to identify and develop effective coping behaviors will improve, Ability to maintain clinical measurements within normal limits will improve, Compliance with prescribed medications will improve, and Ability to identify triggers associated with substance abuse/mental health issues will improve             -- Long Term Goals: Improvement in symptoms so as ready for discharge                3. Medical Issues Being Addressed:    Tobacco (Vaping) use disorder- PRN nicotine patch, consider outpatient tobacco cessation as vaping is negatively contributing to anxiety and mood   4. Discharge Planning:              -- Social work and case management to assist with discharge planning and identification of hospital follow-up needs prior to discharge             -- Estimated LOS: 5-7 days             -- Discharge Concerns: Need to establish a safety plan; Medication compliance and effectiveness             -- Discharge Goals: Return home with outpatient referrals for mental health follow-up including medication management/psychotherapy      Cristy Hilts, MD 07/04/2021, 10:00 AM  Total Time Spent in Direct Patient Care:  I personally spent 35 minutes on the unit in direct patient care. The direct patient care time included face-to-face time with the patient, reviewing the patient's chart, communicating with other professionals, and coordinating care. Greater than 50% of this time was spent in  counseling or coordinating care with the patient regarding goals of hospitalization, psycho-education, and discharge planning needs.   Phineas Inches, MD Psychiatrist

## 2021-07-05 MED ORDER — TRAZODONE HCL 50 MG PO TABS: 50.0000 mg | ORAL_TABLET | Freq: Every evening | ORAL | 0 refills | Status: DC | PRN

## 2021-07-05 MED ORDER — HYDROXYZINE HCL 25 MG PO TABS: 25.0000 mg | ORAL_TABLET | Freq: Three times a day (TID) | ORAL | 0 refills | Status: AC | PRN

## 2021-07-05 MED ORDER — NICOTINE 21 MG/24HR TD PT24
21.0000 mg | MEDICATED_PATCH | Freq: Every day | TRANSDERMAL | 0 refills | Status: AC
Start: 2021-07-06 — End: 2021-08-03

## 2021-07-05 MED ORDER — ARIPIPRAZOLE 15 MG PO TABS: 7.5000 mg | ORAL_TABLET | Freq: Every day | ORAL | 0 refills | Status: DC

## 2021-07-05 NOTE — Discharge Summary (Signed)
Physician Discharge Summary Note  Patient:  Emily Golden is an 21 y.o., female MRN:  [id number] DOB:  2000-02-19 Patient phone:  [phone] (home)  Patient address:   [address] [place] [address],  Total Time spent with patient: 15 minutes  Date of Admission:  07/01/2021 Date of Discharge: 07-05-2021  Reason for Admission:   Patient is a 21 year old female with a reported psychiatric history of "depression, bipolar, generalized anxiety disorder, PTSD, ADD, and dyslexia" who was admitted to the psychiatric hospital from the [facility] for worsening depression and suicidal thoughts.  Principal Problem: Bipolar 2 disorder [facility]) Discharge Diagnoses: Principal Problem:   Bipolar 2 disorder (HCC) Active Problems:   PTSD (post-traumatic stress disorder)   GAD (generalized anxiety disorder)   ADHD   Tobacco use disorder   Alcohol consumption binge drinking   Past Psychiatric History:  Dx: BP vs MDD, GAD, PTSD, ADHD  Patient reports being diagnosed with ADD and dyslexia in first grade 2020 at age 83 previous [facility] admission suicide attempt by overdose 2018 at age 51 previous [facility] admission 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, self harm; diagnosed with bipolar and anxiety; later diagnosed with PTSD in outpatient Current psychiatric medications: None  Past psychiatric medication history: Lexapro, prozac, zoloft, another she can't remember Anxiety meds (can't remember) Sleep meds (can't remember) PTSD meds (can't remember)   Past Medical History:  Past Medical History:  Diagnosis Date   Allergy    Anxiety    Asthma     Past Surgical History:  Procedure Laterality Date   NASAL RECONSTRUCTION     Family History:  Family History  Problem Relation Age of Onset   Diabetes Maternal Grandmother    Hypertension Maternal Grandmother    Hyperlipidemia Maternal Grandmother    Heart disease Maternal Grandfather    Cancer Maternal Grandfather    Diabetes Maternal Grandfather    Heart  failure Neg Hx    Family Psychiatric  History:  father has bipolar, schizophrenia, anxiety Mother has anxiety Great-grandmother (mom's side) has schizophrenia Father and female members of paternal side attempted suicide  Social history: currently married   Social History:  Social History   Substance and Sexual Activity  Alcohol Use Yes   Alcohol/week: 6.0 standard drinks of alcohol   Types: 6 Glasses of wine per week   Comment: Reports recent binge drinking of 7-9 drinks daily for the past 3 days;     Social History   Substance and Sexual Activity  Drug Use Not Currently   Types: Marijuana   Comment: cannabis use, last use 6 months ago    Social History   Socioeconomic History   Marital status: Married    Spouse name: Not on file   Number of children: Not on file   Years of education: Not on file   Highest education level: Not on file  Occupational History   Not on file  Tobacco Use   Smoking status: Former    Types: Cigarettes    Quit date: 04/09/2018    Years since quitting: 3.2   Smokeless tobacco: Never   Tobacco comments:    Says no smoking for 2 months  Vaping Use   Vaping Use: Every day  Substance and Sexual Activity   Alcohol use: Yes    Alcohol/week: 6.0 standard drinks of alcohol    Types: 6 Glasses of wine per week    Comment: Reports recent binge drinking of 7-9 drinks daily for the past 3 days;   Drug use:  Not Currently    Types: Marijuana    Comment: cannabis use, last use 6 months ago   Sexual activity: Not Currently    Birth control/protection: Abstinence  Other Topics Concern   Not on file  Social History Narrative   Is in 10th grade at [employer]   Social Determinants of Health   Financial Resource Strain: Not on file  Food Insecurity: Not on file  Transportation Needs: Not on file  Physical Activity: Not on file  Stress: Not on file  Social Connections: Not on file    Hospital Course:    HOSPITAL  COURSE:  During the patient's hospitalization, patient had extensive initial psychiatric evaluation, and follow-up psychiatric evaluations every day.  Psychiatric diagnoses provided upon initial assessment:  -Bipolar disorder, type 2, current mood episode is depressed, without psychotic features -GAD -PTSD -ADHD -Tobacco use d/o  -R/o Borderline PD   Patient's psychiatric medications were adjusted on admission:  -Start Abilify 5 mg daily   During the hospitalization, other adjustments were made to the patient's psychiatric medication regimen:  -titrated abilify to 7.5 mg once daily  -changed trazodone to 50 mg qhs plus 50 mg qhs prn   Patient's care was discussed during the interdisciplinary team meeting every day during the hospitalization.  The patient denied having side effects to prescribed psychiatric medication.  Gradually, patient started adjusting to milieu. The patient was evaluated each day by a clinical provider to ascertain response to treatment. Improvement was noted by the patient's report of decreasing symptoms, improved sleep and appetite, affect, medication tolerance, behavior, and participation in unit programming.  Patient was asked each day to complete a self inventory noting mood, mental status, pain, new symptoms, anxiety and concerns.    Symptoms were reported as significantly decreased or resolved completely by discharge.   On day of discharge, the patient reports that their mood is stable. The patient denied having suicidal thoughts for more than 48 hours prior to discharge.  Patient denies having homicidal thoughts.  Patient denies having auditory hallucinations.  Patient denies any visual hallucinations or other symptoms of psychosis. The patient was motivated to continue taking medication with a goal of continued improvement in mental health.   The patient reports their target psychiatric symptoms of depression and suicidal thoughts, all responded well to the  psychiatric medications, and the patient reports overall benefit other psychiatric hospitalization. Supportive psychotherapy was provided to the patient. The patient also participated in regular group therapy while hospitalized. Coping skills, problem solving as well as relaxation therapies were also part of the unit programming.  Labs were reviewed with the patient, and abnormal results were discussed with the patient.  The patient is able to verbalize their individual safety plan to this provider.  # It is recommended to the patient to continue psychiatric medications as prescribed, after discharge from the hospital.    # It is recommended to the patient to follow up with your outpatient psychiatric provider and PCP.  # It was discussed with the patient, the impact of alcohol, drugs, tobacco have been there overall psychiatric and medical wellbeing, and total abstinence from substance use was recommended the patient.ed.  # Prescriptions provided or sent directly to preferred pharmacy at discharge. Patient agreeable to plan. Given opportunity to ask questions. Appears to feel comfortable with discharge.    # In the event of worsening symptoms, the patient is instructed to call the crisis hotline, 911 and or go to the nearest ED for appropriate  evaluation and treatment of symptoms. To follow-up with primary care provider for other medical issues, concerns and or health care needs  # Patient was discharged home with a plan to follow up as noted below.    Physical Findings: AIMS: Facial and Oral Movements Muscles of Facial Expression: None, normal Lips and Perioral Area: None, normal Jaw: None, normal Tongue: None, normal,Extremity Movements Upper (arms, wrists, hands, fingers): None, normal Lower (legs, knees, ankles, toes): None, normal, Trunk Movements Neck, shoulders, hips: None, normal, Overall Severity Severity of abnormal movements (highest score from questions above): None,  normal Incapacitation due to abnormal movements: None, normal Patient's awareness of abnormal movements (rate only patient's report): No Awareness, Dental Status Current problems with teeth and/or dentures?: No Does patient usually wear dentures?: No  CIWA:    COWS:     Musculoskeletal: Strength & Muscle Tone: within normal limits Gait & Station: normal Patient leans: N/A   Psychiatric Specialty Exam:  Presentation  General Appearance: Casual  Eye Contact:Good  Speech:Normal Rate  Speech Volume:Normal  Handedness:Right   Mood and Affect  Mood:Euthymic  Affect:Appropriate; Congruent; Full Range   Thought Process  Thought Processes:Linear  Descriptions of Associations:Intact  Orientation:Full (Time, Place and Person)  Thought Content:Logical  History of Schizophrenia/Schizoaffective disorder:No  Duration of Psychotic Symptoms:No data recorded Hallucinations:Hallucinations: None  Ideas of Reference:None  Suicidal Thoughts:Suicidal Thoughts: No SI Passive Intent and/or Plan: Without Intent; Without Plan  Homicidal Thoughts:Homicidal Thoughts: No   Sensorium  Memory:Immediate Good; Recent Good; Remote Good  Judgment:Fair  Insight:Fair   Executive Functions  Concentration:Fair  Attention Span:Fair  Recall:Fair  Fund of Knowledge:Fair  Language:Fair   Psychomotor Activity  Psychomotor Activity:Psychomotor Activity: Normal (no eps on day of discharge. aims score zero on day of dc.)   Assets  Assets:Communication Skills   Sleep  Sleep:Sleep: Good    Physical Exam: Physical Exam Vitals reviewed.  Pulmonary:     Effort: Pulmonary effort is normal.  Neurological:     Mental Status: She is alert.     Motor: No weakness.     Gait: Gait normal.  Psychiatric:        Mood and Affect: Mood normal.        Behavior: Behavior normal.        Thought Content: Thought content normal.        Judgment: Judgment normal.    Review of  Systems  Constitutional:  Negative for chills and fever.  Cardiovascular:  Negative for chest pain and palpitations.  Neurological:  Negative for dizziness, tingling, tremors and headaches.  Psychiatric/Behavioral:  Negative for depression, hallucinations, memory loss, substance abuse and suicidal ideas. The patient is not nervous/anxious and does not have insomnia.   All other systems reviewed and are negative.  Blood pressure 111/74, pulse 92, temperature 98 F (36.7 C), temperature source Oral, resp. rate 14, height 5' 7.5" (1.715 m), weight 121 kg, last menstrual period 06/29/2021, SpO2 90 %. Body mass index is 41.17 kg/m.   Social History   Tobacco Use  Smoking Status Former   Types: Cigarettes   Quit date: 04/09/2018   Years since quitting: 3.2  Smokeless Tobacco Never  Tobacco Comments   Says no smoking for 2 months   Tobacco Cessation:  A prescription for an FDA-approved tobacco cessation medication provided at discharge   Blood Alcohol level:  Lab Results  Component Value Date   [facility] <10 07/01/2021   ETH <10 06/07/2018    Metabolic Disorder Labs:  Lab Results  Component Value Date   HGBA1C 4.8 07/01/2021   MPG 91 07/01/2021   MPG 88.19 06/11/2018   Lab Results  Component Value Date   PROLACTIN 8.2 07/01/2021   PROLACTIN 30.7 (H) 06/11/2018   Lab Results  Component Value Date   CHOL 159 07/01/2021   TRIG 76 07/01/2021   HDL 42 07/01/2021   CHOLHDL 3.8 07/01/2021   VLDL 15 07/01/2021   LDLCALC 102 (H) 07/01/2021   LDLCALC 94 06/11/2018    See Psychiatric Specialty Exam and Suicide Risk Assessment completed by Attending Physician prior to discharge.  Discharge destination:  Home  Is patient on multiple antipsychotic therapies at discharge:  No   Has Patient had three or more failed trials of antipsychotic monotherapy by history:  No  Recommended Plan for Multiple Antipsychotic Therapies: NA  Discharge Instructions     Diet - low sodium heart  healthy   Complete by: As directed    Increase activity slowly   Complete by: As directed       Allergies as of 07/05/2021       Reactions   Other Shortness Of Breath   Dust, Reports smells,perfumes,aerosols HX asthma   Strawberry (diagnostic) Hives, Shortness Of Breath   Difficulty breathing also   Strawberry Extract Hives, Shortness Of Breath        Medication List     STOP taking these medications    ibuprofen 800 MG tablet Commonly known as: ADVIL       TAKE these medications      Indication  albuterol 108 (90 Base) MCG/ACT inhaler Commonly known as: VENTOLIN HFA Inhale 2 puffs into the lungs every 6 (six) hours as needed for wheezing or shortness of breath.  Indication: Asthma   ARIPiprazole 15 MG tablet Commonly known as: ABILIFY Take 0.5 tablets (7.5 mg total) by mouth daily. Start taking on: July 06, 2021  Indication: MIXED BIPOLAR AFFECTIVE DISORDER   beclomethasone 80 MCG/ACT inhaler Commonly known as: QVAR Inhale 2 puffs into the lungs daily.  Indication: Asthma   folic acid 1 MG tablet Commonly known as: FOLVITE Take 1 mg by mouth daily.  Indication: vitamin   hydrOXYzine 25 MG tablet Commonly known as: ATARAX Take 1 tablet (25 mg total) by mouth 3 (three) times daily as needed for anxiety.  Indication: Feeling Anxious   medroxyPROGESTERone Acetate 150 MG/ML Susy Inject 150 mg into the muscle every 3 (three) months. Depo  Indication: Birth Control Treatment   nicotine 21 mg/24hr patch Commonly known as: NICODERM CQ - dosed in mg/24 hours Place 1 patch (21 mg total) onto the skin daily for 28 days. Start taking on: July 06, 2021  Indication: Nicotine Addiction   traZODone 50 MG tablet Commonly known as: DESYREL Take 1 tablet (50 mg total) by mouth at bedtime and may repeat dose one time if needed.  Indication: Trouble Sleeping        Follow-up Information     [facility] Go to.   Specialty: Professional  Counselor Why: Please go to this provider for therapy and medication management services assessment for new patients during walk in hours:  Monday through Friday, from 9:00 am to 1:00 pm. Contact information: [address] [place] [phone]                 Follow-up recommendations:    Activity: as tolerated  Diet: heart healthy  Other: -Follow-up with your outpatient psychiatric provider -instructions on appointment date,  time, and address (location) are provided to you in discharge paperwork.  -Take your psychiatric medications as prescribed at discharge - instructions are provided to you in the discharge paperwork  -Follow-up with outpatient primary care doctor and other specialists -for management of chronic medical disease.  -Testing: Follow-up with outpatient provider for abnormal lab results:  none  -Recommend abstinence from alcohol, tobacco, and other illicit drug use at discharge.   -If your psychiatric symptoms recur, worsen, or if you have side effects to your psychiatric medications, call your outpatient psychiatric provider, 911, 988 or go to the nearest emergency department.  -If suicidal thoughts recur, call your outpatient psychiatric provider, 911, 988 or go to the nearest emergency department.   Signed: Cristy Hilts, MD 07/05/2021, 10:30 AM   Total Time Spent in Direct Patient Care:  I personally spent 35 minutes on the unit in direct patient care. The direct patient care time included face-to-face time with the patient, reviewing the patient's chart, communicating with other professionals, and coordinating care. Greater than 50% of this time was spent in counseling or coordinating care with the patient regarding goals of hospitalization, psycho-education, and discharge planning needs.   Phineas Inches, MD Psychiatrist

## 2021-07-05 NOTE — Progress Notes (Signed)
Pt rates depression 1/10 and anxiety 5/10. Pt reports a okay appetite, and no physical problems. Pt denies SI/HI/AVH and verbally contracts for safety. Provided support and encouragement. Pt safe on the unit. Q 15 minute safety checks continued.
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
## 2021-07-05 NOTE — Progress Notes (Signed)
Discharge Note:  Patient discharged home with husband.  Suicide prevention information given and discussed with patient who stated she understood and had no questions.  Patient denied SI and HI.  Denied A/V hallucinations.  Patient stated she received all her belongings, clothing, toiletries, misc items, etc.  Patient stated she appreciated all assistance received from [facility] staff.  All required discharge information given.  

## 2021-07-05 NOTE — [facility] Group Notes (Signed)
Spiritual care group on grief and loss facilitated by chaplain Dyanne Carrel, [facility]   Group Goal:   Support / Education around grief and loss   Members engage in facilitated group support and psycho-social education.   Group Description:   Following introductions and group rules, group members engaged in facilitated group dialog and support around topic of loss, with particular support around experiences of loss in their lives. Group Identified types of loss (relationships / self / things) and identified patterns, circumstances, and changes that precipitate losses. Reflected on thoughts / feelings around loss, normalized grief responses, and recognized variety in grief experience. Group noted Worden's four tasks of grief in discussion.   Group drew on Adlerian / Rogerian, narrative, MI,   Patient Progress: Emily Golden attended group and participated and engaged in group conversation.  She resonated with what others shared, particularly around trauma, but did not share details.  [address], Bcc Pager, [phone]

## 2021-07-05 NOTE — Progress Notes (Signed)
  [facility] Adult Case Management Discharge Plan :  Will you be returning to the same living situation after discharge:  Yes,  Patient to return to place of residence with spouse and roommates.  At discharge, do you have transportation home?: Yes,  Patient's spouse to provide transportation from hospital.  Do you have the ability to pay for your medications: Yes,  [facility]   Release of information consent forms completed and in the chart;  Patient's signature needed at discharge.  Patient to Follow up at:  Follow-up Information     [place] Go to.   Specialty: Professional Counselor Why: Please go to this provider for therapy and medication management services assessment for new patients during walk in hours:  Monday through Friday, from 9:00 am to 1:00 pm. Contact information: [address] [place] [address] [phone]                 Next level of care provider has access to [facility] Link:no  Safety Planning and Suicide Prevention discussed: Yes,  SPE completed with patient and Teodoro Spray, spouse.  Patient's support agrees to monitor patient for safety, ensure treatment compliance, and alert emergency services should patient require such services.    Has patient been referred to the Quitline?: Patient refused referral Tobacco Use: Medium Risk (07/02/2021)   Patient History    Smoking Tobacco Use: Former    Smokeless Tobacco Use: Never    Passive Exposure: Not on file   Patient has been referred for addiction treatment: YesPatient endorses occasional binge use of alcohol. Referred to [facility], clinic operated on a new patient walk in clinic, unable to schedule appointment for intake. Information for clinic provided above.   Social History   Substance and Sexual Activity  Drug Use Not Currently   Types: Marijuana   Comment: cannabis use, last use 6 months ago   Social History   Substance and  Sexual Activity  Alcohol Use Yes   Alcohol/week: 6.0 standard drinks of alcohol   Types: 6 Glasses of wine per week   Comment: Reports recent binge drinking of 7-9 drinks daily for the past 3 days;    Corky Crafts, LCSWA 07/05/2021, 10:31 AM

## 2021-07-05 NOTE — Plan of Care (Signed)
Nurse discussed anxiety, depression and coping skills with patient.  

## 2021-07-05 NOTE — Progress Notes (Signed)
D:  Patient denied SI and HI, contracts for safety.  Denied A/V hallucinations.  Denied pain. A:  Medications administered per MD orders.  Emotional support and encouragement given patient. R:   Safety maintained with 15 minute checks. Patient slept 7 hours last night.  Feels she is ready for discharge today.

## 2021-08-09 ENCOUNTER — Ambulatory Visit
Admission: EM | Admit: 2021-08-09 | Discharge: 2021-08-09 | Disposition: A | Discharge status: Home / Self Care | Payer: Medicaid Other | Attending: Internal Medicine | Admitting: Internal Medicine

## 2021-08-09 ENCOUNTER — Encounter: Payer: Self-pay | Admitting: Emergency Medicine

## 2021-08-09 DIAGNOSIS — J069 Acute upper respiratory infection, unspecified: Secondary | ICD-10-CM | POA: Diagnosis present

## 2021-08-09 DIAGNOSIS — J029 Acute pharyngitis, unspecified: Secondary | ICD-10-CM | POA: Diagnosis present

## 2021-08-09 LAB — POCT RAPID STREP A (OFFICE): Rapid Strep A Screen: NEGATIVE

## 2021-08-09 MED ORDER — FLUTICASONE PROPIONATE 50 MCG/ACT NA SUSP: 1.0000 | Freq: Every day | NASAL | 0 refills | Status: DC

## 2021-08-09 NOTE — ED Triage Notes (Addendum)
Pt is present today with c/o sore throat and cough.Pt sx started x2 days ago. Pt states she is also [redacted] weeks pregnant

## 2021-08-09 NOTE — Discharge Instructions (Addendum)
Rapid strep is negative.  Throat culture and COVID test are pending.  We will call if they are positive.  It appears that you have a viral upper respiratory infection that should run its course and self resolve with symptomatic treatment.  You have been prescribed a nasal spray that will help alleviate symptoms.  Plain guaifenesin is typically safe for cough in pregnancy.  Although, I would call your OB/GYN and ask for recommendations.  I would also notify them of the daily medications that you take in case they want to change them.

## 2021-08-09 NOTE — ED Provider Notes (Signed)
[name]    CSN: [id number] Arrival date & time: 08/09/21  1452      History   Chief Complaint Chief Complaint  Patient presents with   Cough   Sore Throat    Pt states x6 weeks pregnant     HPI Emily Golden is a 21 y.o. female.   Patient presents with nasal congestion, cough, sore throat that started about 2 days ago.  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  Denies any known fevers.  Denies chest pain, shortness of breath, ear pain, nausea, vomiting, diarrhea, abdominal pain.  Patient has taken Benadryl for symptoms with minimal improvement.  Patient also reports that she is currently [redacted] weeks pregnant.   Cough Sore Throat    Past Medical History:  Diagnosis Date   Allergy    Anxiety    Asthma     Patient Active Problem List   Diagnosis Date Noted   Bipolar 2 disorder (HCC) 07/02/2021   GAD (generalized anxiety disorder) 07/02/2021   ADHD 07/02/2021   Tobacco use disorder 07/02/2021   Alcohol consumption binge drinking 07/02/2021   Suicide attempt by drug ingestion (HCC) 06/09/2018   Ingestion of unknown substance 06/08/2018   PTSD (post-traumatic stress disorder)    Syncope 06/24/2015   Insulin resistance 06/24/2015   Morbid childhood obesity with BMI greater than 99th percentile for age [facility]) 06/24/2015    Past Surgical History:  Procedure Laterality Date   NASAL RECONSTRUCTION      OB History     Gravida  1   Para      Term      Preterm      AB      Living         SAB      IAB      Ectopic      Multiple      Live Births               Home Medications    Prior to Admission medications   Medication Sig Start Date End Date Taking? Authorizing Provider  fluticasone (FLONASE) 50 MCG/ACT nasal spray Place 1 spray into both nostrils daily for 3 days. 08/09/21 08/12/21 Yes , Acie Fredrickson, FNP  albuterol (PROVENTIL HFA;VENTOLIN HFA) 108 (90 Base)  MCG/ACT inhaler Inhale 2 puffs into the lungs every 6 (six) hours as needed for wheezing or shortness of breath.    [provider]  ARIPiprazole (ABILIFY) 15 MG tablet Take 0.5 tablets (7.5 mg total) by mouth daily. 07/06/21 08/05/21  Massengill, Harrold Donath, MD  beclomethasone (QVAR) 80 MCG/ACT inhaler Inhale 2 puffs into the lungs daily.    [provider]  folic acid (FOLVITE) 1 MG tablet Take 1 mg by mouth daily.    [provider]  hydrOXYzine (ATARAX) 50 MG tablet 1 tablet as needed    [provider]  medroxyPROGESTERone Acetate 150 MG/ML SUSY Inject 150 mg into the muscle every 3 (three) months. Depo 10/13/17   [provider]  traZODone (DESYREL) 50 MG tablet Take 1 tablet (50 mg total) by mouth at bedtime and may repeat dose one time if needed. 07/05/21 08/04/21  Phineas Inches, MD    Family History Family History  Problem Relation Age of Onset   Diabetes Maternal Grandmother    Hypertension Maternal Grandmother    Hyperlipidemia Maternal Grandmother    Heart disease Maternal Grandfather  Cancer Maternal Grandfather    Diabetes Maternal Grandfather    Heart failure Neg Hx     Social History Social History   Tobacco Use   Smoking status: Former    Types: Cigarettes    Quit date: 04/09/2018    Years since quitting: 3.3   Smokeless tobacco: Never   Tobacco comments:    Says no smoking for 2 months  Vaping Use   Vaping Use: Every day  Substance Use Topics   Alcohol use: Yes    Alcohol/week: 6.0 standard drinks of alcohol    Types: 6 Glasses of wine per week    Comment: Reports recent binge drinking of 7-9 drinks daily for the past 3 days;   Drug use: Not Currently    Types: Marijuana    Comment: cannabis use, last use 6 months ago     Allergies   Other, Strawberry (diagnostic), and Strawberry extract   Review of Systems Review of Systems Per HPI  Physical Exam Triage Vital Signs ED Triage Vitals  Enc Vitals Group      BP 08/09/21 1712 124/83     Pulse Rate 08/09/21 1712 87     Resp 08/09/21 1712 18     Temp 08/09/21 1712 98.5 F (36.9 C)     Temp src --      SpO2 08/09/21 1712 98 %     Weight --      Height --      Head Circumference --      Peak Flow --      Pain Score 08/09/21 1710 8     Pain Loc --      Pain Edu? --      Excl. in GC? --    No data found.  Updated Vital Signs BP 124/83   Pulse 87   Temp 98.5 F (36.9 C)   Resp 18   LMP 06/29/2021   SpO2 98%   Visual Acuity Right Eye Distance:   Left Eye Distance:   Bilateral Distance:    Right Eye Near:   Left Eye Near:    Bilateral Near:     Physical Exam Constitutional:      General: She is not in acute distress.    Appearance: Normal appearance. She is not toxic-appearing or diaphoretic.  HENT:     Head: Normocephalic and atraumatic.     Right Ear: Tympanic membrane and ear canal normal.     Left Ear: Tympanic membrane and ear canal normal.     Nose: Congestion present.     Mouth/Throat:     Mouth: Mucous membranes are moist.     Pharynx: Posterior oropharyngeal erythema present. No pharyngeal swelling, oropharyngeal exudate or uvula swelling.     Tonsils: No tonsillar exudate or tonsillar abscesses.  Eyes:     Extraocular Movements: Extraocular movements intact.     Conjunctiva/sclera: Conjunctivae normal.     Pupils: Pupils are equal, round, and reactive to light.  Cardiovascular:     Rate and Rhythm: Normal rate and regular rhythm.     Pulses: Normal pulses.     Heart sounds: Normal heart sounds.  Pulmonary:     Effort: Pulmonary effort is normal. No respiratory distress.     Breath sounds: Normal breath sounds. No wheezing.  Abdominal:     General: Abdomen is flat. Bowel sounds are normal.     Palpations: Abdomen is soft.  Musculoskeletal:        General: Normal range of  motion.     Cervical back: Normal range of motion.  Skin:    General: Skin is warm and dry.  Neurological:     General: No focal  deficit present.     Mental Status: She is alert and oriented to person, place, and time. Mental status is at baseline.  Psychiatric:        Mood and Affect: Mood normal.        Behavior: Behavior normal.      UC Treatments / Results  Labs (all labs ordered are listed, but only abnormal results are displayed) Labs Reviewed  CULTURE, GROUP A STREP ([place])  NOVEL CORONAVIRUS, NAA  POCT RAPID STREP A (OFFICE)    EKG   Radiology No results found.  Procedures Procedures (including critical care time)  Medications Ordered in UC Medications - No data to display  Initial Impression / Assessment and Plan / UC Course  I have reviewed the triage vital signs and the nursing notes.  Pertinent labs & imaging results that were available during my care of the patient were reviewed by me and considered in my medical decision making (see chart for details).     Patient presents with symptoms likely from a viral upper respiratory infection. Differential includes bacterial pneumonia, sinusitis, allergic rhinitis, covid 19, flu. Do not suspect underlying cardiopulmonary process. Symptoms seem unlikely related to ACS, CHF or COPD exacerbations, pneumonia, pneumothorax. Patient is nontoxic appearing and not in need of emergent medical intervention.  Rapid strep was negative.  Throat culture and COVID test pending.    Recommended symptom control with medications that are safe with pregnancy.  Flonase prescribed for patient.  Advised patient that plain guaifenesin is typically safe with pregnancy but advised to follow-up with OB/GYN for further recommendations.  Also advised patient to follow-up with OB/GYN to notify them of her daily medications in case they want her to stop them given recent pregnancy.  Return if symptoms fail to improve in 1-2 weeks or you develop shortness of breath, chest pain, severe headache. Patient states understanding and is agreeable.  Discharged with PCP followup.   Final Clinical Impressions(s) / UC Diagnoses   Final diagnoses:  Viral upper respiratory tract infection with cough  Sore throat     Discharge Instructions      Rapid strep is negative.  Throat culture and COVID test are pending.  We will call if they are positive.  It appears that you have a viral upper respiratory infection that should run its course and self resolve with symptomatic treatment.  You have been prescribed a nasal spray that will help alleviate symptoms.  Plain guaifenesin is typically safe for cough in pregnancy.  Although, I would call your OB/GYN and ask for recommendations.  I would also notify them of the daily medications that you take in case they want to change them.    ED Prescriptions     Medication Sig Dispense Auth. Provider   fluticasone (FLONASE) 50 MCG/ACT nasal spray Place 1 spray into both nostrils daily for 3 days. 16 g Teodora Medici, [place]      PDMP not reviewed this encounter.   Teodora Medici, [place] 08/09/21 1820

## 2021-08-10 LAB — NOVEL CORONAVIRUS, NAA: SARS-CoV-2, NAA: NOT DETECTED

## 2021-08-12 ENCOUNTER — Encounter: Payer: Self-pay | Admitting: *Deleted

## 2021-08-12 LAB — CULTURE, GROUP A STREP (THRC)

## 2021-08-31 ENCOUNTER — Ambulatory Visit
Admission: RE | Admit: 2021-08-31 | Discharge: 2021-08-31 | Disposition: A | Discharge status: Home / Self Care | Payer: Medicaid Other | Source: Ambulatory Visit | Attending: Physician Assistant | Admitting: Physician Assistant

## 2021-08-31 ENCOUNTER — Other Ambulatory Visit: Payer: Self-pay

## 2021-08-31 VITALS — BP 108/75 | HR 93 | Temp 98.1°F | Resp 18

## 2021-08-31 DIAGNOSIS — Z3A09 9 weeks gestation of pregnancy: Secondary | ICD-10-CM | POA: Diagnosis not present

## 2021-08-31 DIAGNOSIS — O219 Vomiting of pregnancy, unspecified: Secondary | ICD-10-CM | POA: Diagnosis not present

## 2021-08-31 DIAGNOSIS — O26891 Other specified pregnancy related conditions, first trimester: Secondary | ICD-10-CM | POA: Diagnosis present

## 2021-08-31 DIAGNOSIS — R42 Dizziness and giddiness: Secondary | ICD-10-CM | POA: Insufficient documentation

## 2021-08-31 LAB — POCT URINALYSIS DIP (MANUAL ENTRY)
Glucose, UA: NEGATIVE mg/dL
Ketones, POC UA: NEGATIVE mg/dL
Leukocytes, UA: NEGATIVE
Nitrite, UA: NEGATIVE
Protein Ur, POC: 30 mg/dL — AB
Spec Grav, UA: >=1.03 — AB (ref 1.010–1.025)
Urobilinogen, UA: 1 E.U./dL
pH, UA: 6 (ref 5.0–8.0)

## 2021-08-31 NOTE — ED Triage Notes (Signed)
Pt sts feeling dizzy with N/V at times intermittently; pt sts [redacted] weeks pregnant with LMP 06/29/21

## 2021-08-31 NOTE — Discharge Instructions (Signed)
Labs will be able to be reviewed on MyChart.   Please call you OB if symptoms persist.  Report to ED with any further concerns for stat labs- [facility] will likely send you to area known as MAU for pregnant patients.

## 2021-08-31 NOTE — ED Provider Notes (Signed)
[name]    CSN: [id number] Arrival date & time: 08/31/21  0846      History   Chief Complaint Chief Complaint  Patient presents with   Dizziness    [redacted] weeks pregnant. - Entered by patient    HPI Emily Golden is a 21 y.o. female.   Patient here today for evaluation of dizziness, nausea and vomiting that occurs intermittently. Symptoms can occur at rest and with activity. She is currently [redacted] weeks pregnant. She reports her LMP was 06/29/21. Her primary care provider has prescribed her a nausea medication but she does continue to have trouble with water due to vomiting. She notes before she has significant nausea, vomiting, lightheadedness she will have a wave of "heat" . She has not had fever. She has appointment with OB in about 3 weeks.   The history is provided by the patient.  Dizziness Associated symptoms: nausea and vomiting   Associated symptoms: no shortness of breath     Past Medical History:  Diagnosis Date   Allergy    Anxiety    Asthma     Patient Active Problem List   Diagnosis Date Noted   Bipolar 2 disorder (HCC) 07/02/2021   GAD (generalized anxiety disorder) 07/02/2021   ADHD 07/02/2021   Tobacco use disorder 07/02/2021   Alcohol consumption binge drinking 07/02/2021   Suicide attempt by drug ingestion (HCC) 06/09/2018   Ingestion of unknown substance 06/08/2018   PTSD (post-traumatic stress disorder)    Syncope 06/24/2015   Insulin resistance 06/24/2015   Morbid childhood obesity with BMI greater than 99th percentile for age [facility]) 06/24/2015    Past Surgical History:  Procedure Laterality Date   NASAL RECONSTRUCTION      OB History     Gravida  1   Para      Term      Preterm      AB      Living         SAB      IAB      Ectopic      Multiple      Live Births               Home Medications    Prior to Admission medications   Medication Sig Start Date End Date Taking? Authorizing Provider  albuterol  (PROVENTIL HFA;VENTOLIN HFA) 108 (90 Base) MCG/ACT inhaler Inhale 2 puffs into the lungs every 6 (six) hours as needed for wheezing or shortness of breath.    [provider]  ARIPiprazole (ABILIFY) 15 MG tablet Take 0.5 tablets (7.5 mg total) by mouth daily. 07/06/21 08/05/21  Massengill, Harrold Donath, MD  beclomethasone (QVAR) 80 MCG/ACT inhaler Inhale 2 puffs into the lungs daily.    [provider]  fluticasone (FLONASE) 50 MCG/ACT nasal spray Place 1 spray into both nostrils daily for 3 days. 08/09/21 08/12/21  Gustavus Bryant, FNP  folic acid (FOLVITE) 1 MG tablet Take 1 mg by mouth daily.    [provider]  hydrOXYzine (ATARAX) 50 MG tablet 1 tablet as needed    [provider]  medroxyPROGESTERone Acetate 150 MG/ML SUSY Inject 150 mg into the muscle every 3 (three) months. Depo 10/13/17   [provider]  traZODone (DESYREL) 50 MG tablet Take 1 tablet (50 mg total) by mouth at bedtime and may repeat dose one time if needed. 07/05/21 08/04/21  Phineas Inches, MD    Family History Family History  Problem Relation Age  of Onset   Diabetes Maternal Grandmother    Hypertension Maternal Grandmother    Hyperlipidemia Maternal Grandmother    Heart disease Maternal Grandfather    Cancer Maternal Grandfather    Diabetes Maternal Grandfather    Heart failure Neg Hx     Social History Social History   Tobacco Use   Smoking status: Former    Types: Cigarettes    Quit date: 04/09/2018    Years since quitting: 3.3   Smokeless tobacco: Never   Tobacco comments:    Says no smoking for 2 months  Vaping Use   Vaping Use: Every day  Substance Use Topics   Alcohol use: Yes    Alcohol/week: 6.0 standard drinks of alcohol    Types: 6 Glasses of wine per week    Comment: Reports recent binge drinking of 7-9 drinks daily for the past 3 days;   Drug use: Not Currently    Types: Marijuana    Comment: cannabis use, last use 6 months ago     Allergies   Other,  Strawberry (diagnostic), and Strawberry extract   Review of Systems Review of Systems  Constitutional:  Negative for chills and fever.  Eyes:  Negative for discharge and redness.  Respiratory:  Negative for shortness of breath.   Gastrointestinal:  Positive for nausea and vomiting. Negative for abdominal pain.  Genitourinary:  Negative for vaginal bleeding and vaginal discharge.  Neurological:  Positive for dizziness and light-headedness.     Physical Exam Triage Vital Signs ED Triage Vitals  Enc Vitals Group     BP 08/31/21 0907 108/75     Pulse Rate 08/31/21 0907 93     Resp 08/31/21 0907 18     Temp 08/31/21 0907 98.1 F (36.7 C)     Temp Source 08/31/21 0907 Oral     SpO2 08/31/21 0907 98 %     Weight --      Height --      Head Circumference --      Peak Flow --      Pain Score 08/31/21 0908 0     Pain Loc --      Pain Edu? --      Excl. in GC? --    No data found.  Updated Vital Signs BP 108/75 (BP Location: Left Arm)   Pulse 93   Temp 98.1 F (36.7 C) (Oral)   Resp 18   LMP 06/29/2021   SpO2 98%      Physical Exam Vitals and nursing note reviewed.  Constitutional:      General: She is not in acute distress.    Appearance: Normal appearance. She is not ill-appearing.  HENT:     Head: Normocephalic and atraumatic.  Eyes:     Extraocular Movements: Extraocular movements intact.     Conjunctiva/sclera: Conjunctivae normal.     Pupils: Pupils are equal, round, and reactive to light.  Cardiovascular:     Rate and Rhythm: Normal rate and regular rhythm.     Heart sounds: Normal heart sounds. No murmur heard. Pulmonary:     Effort: Pulmonary effort is normal. No respiratory distress.     Breath sounds: Normal breath sounds. No wheezing, rhonchi or rales.  Neurological:     Mental Status: She is alert.  Psychiatric:        Mood and Affect: Mood normal.        Behavior: Behavior normal.      UC Treatments / Results  Labs (  all labs ordered are  listed, but only abnormal results are displayed) Labs Reviewed  POCT URINALYSIS DIP (MANUAL ENTRY) - Abnormal; Notable for the following components:      Result Value   Bilirubin, UA small (*)    Spec Grav, UA >=1.030 (*)    Blood, UA trace-intact (*)    Protein Ur, POC =30 (*)    All other components within normal limits  URINE CULTURE  CBC WITH DIFFERENTIAL/PLATELET  COMPREHENSIVE METABOLIC PANEL    EKG   Radiology No results found.  Procedures Procedures (including critical care time)  Medications Ordered in UC Medications - No data to display  Initial Impression / Assessment and Plan / UC Course  I have reviewed the triage vital signs and the nursing notes.  Pertinent labs & imaging results that were available during my care of the patient were reviewed by me and considered in my medical decision making (see chart for details).     I suspect that symptoms are related to 1st trimester pregnancy and possible dehydration. I recommended OTC homeopathic remedies for nausea (preggie pops, ginger, etc) and increased fluids with some electrolyte replacement. Will order labs with strict precaution to report to ED with any worsening given it may take over 48 hours to get results of labs. Patient is stable and I encouraged follow up with OB if symptoms persist. Urine culture ordered- no signs of UTI on UA but is consistent with dehydration.   Final Clinical Impressions(s) / UC Diagnoses   Final diagnoses:  [redacted] weeks gestation of pregnancy     Discharge Instructions      Labs will be able to be reviewed on MyChart.   Please call you OB if symptoms persist.  Report to ED with any further concerns for stat labs- [facility] will likely send you to area known as [facility]U for pregnant patients.     ED Prescriptions   None    PDMP not reviewed this encounter.   Tomi Bamberger, PA-C 08/31/21 1324

## 2021-09-01 LAB — CBC WITH DIFFERENTIAL/PLATELET
Basophils Absolute: 0 10*3/uL (ref 0.0–0.2)
Basos: 1 %
EOS (ABSOLUTE): 0.1 10*3/uL (ref 0.0–0.4)
Eos: 1 %
Hematocrit: 41.7 % (ref 34.0–46.6)
Hemoglobin: 13.5 g/dL (ref 11.1–15.9)
Immature Grans (Abs): 0 10*3/uL (ref 0.0–0.1)
Immature Granulocytes: 0 %
Lymphocytes Absolute: 1.3 10*3/uL (ref 0.7–3.1)
Lymphs: 21 %
MCH: 27.6 pg (ref 26.6–33.0)
MCHC: 32.4 g/dL (ref 31.5–35.7)
MCV: 85 fL (ref 79–97)
Monocytes Absolute: 0.4 10*3/uL (ref 0.1–0.9)
Monocytes: 7 %
Neutrophils Absolute: 4.1 10*3/uL (ref 1.4–7.0)
Neutrophils: 70 %
Platelets: 241 10*3/uL (ref 150–450)
RBC: 4.9 x10E6/uL (ref 3.77–5.28)
RDW: 13 % (ref 11.7–15.4)
WBC: 5.9 10*3/uL (ref 3.4–10.8)

## 2021-09-01 LAB — COMPREHENSIVE METABOLIC PANEL
ALT: 19 IU/L (ref 0–32)
AST: 19 IU/L (ref 0–40)
Albumin/Globulin Ratio: 1.8 (ref 1.2–2.2)
Albumin: 4.6 g/dL (ref 4.0–5.0)
Alkaline Phosphatase: 86 IU/L (ref 42–106)
BUN/Creatinine Ratio: 12 (ref 9–23)
BUN: 8 mg/dL (ref 6–20)
Bilirubin Total: 0.4 mg/dL (ref 0.0–1.2)
CO2: 16 mmol/L — ABNORMAL LOW (ref 20–29)
Calcium: 9.6 mg/dL (ref 8.7–10.2)
Chloride: 106 mmol/L (ref 96–106)
Creatinine, Ser: 0.65 mg/dL (ref 0.57–1.00)
Globulin, Total: 2.5 g/dL (ref 1.5–4.5)
Glucose: 71 mg/dL (ref 70–99)
Potassium: 4.1 mmol/L (ref 3.5–5.2)
Sodium: 142 mmol/L (ref 134–144)
Total Protein: 7.1 g/dL (ref 6.0–8.5)
eGFR: 130 mL/min/{1.73_m2} (ref >59)

## 2021-09-01 LAB — URINE CULTURE: Culture: <10000 — AB

## 2021-09-21 ENCOUNTER — Telehealth (INDEPENDENT_AMBULATORY_CARE_PROVIDER_SITE_OTHER): Payer: Medicaid Other

## 2021-09-21 DIAGNOSIS — O039 Complete or unspecified spontaneous abortion without complication: Secondary | ICD-10-CM | POA: Insufficient documentation

## 2021-09-21 DIAGNOSIS — Z3A00 Weeks of gestation of pregnancy not specified: Secondary | ICD-10-CM

## 2021-09-21 DIAGNOSIS — Z3480 Encounter for supervision of other normal pregnancy, unspecified trimester: Secondary | ICD-10-CM

## 2021-09-21 MED ORDER — BLOOD PRESSURE MONITORING DEVI: 1.0000 | 0 refills | Status: DC

## 2021-09-21 MED ORDER — GOJJI WEIGHT SCALE MISC: 1.0000 | 0 refills | Status: DC

## 2021-09-21 NOTE — Progress Notes (Signed)
New OB Intake  I connected with  Emily Golden on 09/21/21 at  9:15 AM EDT by MyChart Video Visit and verified that I am speaking with the correct person using two identifiers. Nurse is located at [facility] and pt is located at [facility].  I discussed the limitations, risks, security and privacy concerns of performing an evaluation and management service by telephone and the availability of in person appointments. I also discussed with the patient that there may be a patient responsible charge related to this service. The patient expressed understanding and agreed to proceed.  I explained I am completing New OB Intake today. We discussed her EDD of 04/05/22 that is based on LMP of 06/29/21. Pt is G1/P0. I reviewed her allergies, medications, Medical/Surgical/OB history, and appropriate screenings. I informed her of [facility] services. [facility] information placed in AVS. Based on history, this is a/an  pregnancy uncomplicated .   Patient Active Problem List   Diagnosis Date Noted   Bipolar 2 disorder ([place]) 07/02/2021   GAD (generalized anxiety disorder) 07/02/2021   ADHD 07/02/2021   Tobacco use disorder 07/02/2021   Alcohol consumption binge drinking 07/02/2021   Suicide attempt by drug ingestion ([place]) 06/09/2018   Ingestion of unknown substance 06/08/2018   PTSD (post-traumatic stress disorder)    Syncope 06/24/2015   Insulin resistance 06/24/2015   Morbid childhood obesity with BMI greater than 99th percentile for age [facility]) 06/24/2015    Concerns addressed today  Delivery Plans Plans to deliver at [facility]. Patient given information for [facility] Healthy Baby website for more information about [facility]. Patient is not interested in water birth. Offered upcoming OB visit with CNM to discuss further.  MyChart/Babyscripts MyChart access verified. I explained pt will have some visits in office and some virtually. Babyscripts instructions given and order placed. Patient verifies receipt of  registration text/e-mail. Account successfully created and app downloaded.  Blood Pressure Cuff/Weight Scale Blood pressure cuff ordered for patient to pick-up from [employer]. Explained after first prenatal appt pt will check weekly and document in 55. Patient does / does not  have weight scale. Weight scale ordered for patient to pick up from [employer].   Anatomy US Explained first scheduled Korea will be around 19 weeks. Anatomy US scheduled for 11/09/21 at 0830a. Pt notified to arrive at 0815a.  Labs Discussed [name] genetic screening with patient. Would like both Panorama and Horizon drawn at new OB visit. Routine prenatal labs needed.  Covid Vaccine Patient has covid vaccine.   Is patient a CenteringPregnancy candidate?  Declined Declined due to [facility] Not a candidate due to NA Centering Patient" indicated on sticky note   Is patient a Mom+Baby Combined Care candidate?  Accepted    Scheduled with Mom+Baby provider   Social Determinants of Health Food Insecurity: Patient denies food insecurity. WIC Referral: Patient is interested in referral to [facility].  Transportation: Patient denies transportation needs. Childcare: Discussed no children allowed at ultrasound appointments. Offered childcare services; patient declines childcare services at this time.  First visit review I reviewed new OB appt with pt. I explained she will have a provider visit that includes . Explained pt will be seen by Dr. Dione Plover at first visit; encounter routed to appropriate provider. Explained that patient will be seen by pregnancy navigator following visit with provider.   [name], [place] 09/21/2021  9:16 AM

## 2021-09-21 NOTE — Patient Instructions (Signed)

## 2021-09-22 ENCOUNTER — Other Ambulatory Visit: Payer: Self-pay

## 2021-09-22 ENCOUNTER — Ambulatory Visit (INDEPENDENT_AMBULATORY_CARE_PROVIDER_SITE_OTHER): Payer: Medicaid Other | Admitting: Family Medicine

## 2021-09-22 ENCOUNTER — Ambulatory Visit (HOSPITAL_BASED_OUTPATIENT_CLINIC_OR_DEPARTMENT_OTHER)
Admission: RE | Admit: 2021-09-22 | Discharge: 2021-09-22 | Disposition: A | Discharge status: Home / Self Care | Payer: Medicaid Other | Source: Ambulatory Visit | Attending: Family Medicine | Admitting: Family Medicine

## 2021-09-22 ENCOUNTER — Encounter: Payer: Self-pay | Admitting: Family Medicine

## 2021-09-22 VITALS — BP 125/83 | HR 114 | Wt 263.0 lb

## 2021-09-22 DIAGNOSIS — Z3480 Encounter for supervision of other normal pregnancy, unspecified trimester: Secondary | ICD-10-CM | POA: Diagnosis not present

## 2021-09-22 DIAGNOSIS — O3680X0 Pregnancy with inconclusive fetal viability, not applicable or unspecified: Secondary | ICD-10-CM | POA: Diagnosis not present

## 2021-09-22 DIAGNOSIS — Z3687 Encounter for antenatal screening for uncertain dates: Secondary | ICD-10-CM | POA: Diagnosis not present

## 2021-09-22 DIAGNOSIS — Z3A01 Less than 8 weeks gestation of pregnancy: Secondary | ICD-10-CM | POA: Diagnosis not present

## 2021-09-22 NOTE — Progress Notes (Signed)
GYNECOLOGY OFFICE VISIT NOTE  History:   Emily Golden is a 21 y.o. G1P0 here today for new OB visit.  Unable to auscultate FHR Bedside US done, IUGS and what appears to be a fetal pole are seen but no cardiac activity detected Patient reports she was on depo for a long time prior to conceiving She reports +UPT about seven weeks ago and that period was somewhat regular prior to conceiving  Health Maintenance Due  Topic Date Due   COVID-19 Vaccine (1) Never done   HPV VACCINES (1 - 2-dose series) Never done   CHLAMYDIA SCREENING  06/10/2019   Hepatitis C Screening  Never done   TETANUS/TDAP  Never done   INFLUENZA VACCINE  08/03/2021    Past Medical History:  Diagnosis Date   Allergy    Anxiety    Asthma     Past Surgical History:  Procedure Laterality Date   NASAL RECONSTRUCTION      The following portions of the patient's history were reviewed and updated as appropriate: allergies, current medications, past family history, past medical history, past social history, past surgical history and problem list.   Health Maintenance:   Last pap: No results found for: "DIAGPAP", "HPV", "[place]" N/a  Last mammogram:  N/a    Review of Systems:  Pertinent items noted in HPI and remainder of comprehensive ROS otherwise negative.  Physical Exam:  BP 125/83   Pulse (!) 114   Wt 263 lb (119.3 kg)   LMP 06/29/2021   BMI 40.58 kg/m  CONSTITUTIONAL: Well-developed, well-nourished female in no acute distress.  HEENT:  Normocephalic, atraumatic. External right and left ear normal. No scleral icterus.  NECK: Normal range of motion, supple, no masses noted on observation SKIN: No rash noted. Not diaphoretic. No erythema. No pallor. MUSCULOSKELETAL: Normal range of motion. No edema noted. NEUROLOGIC: Alert and oriented to person, place, and time. Normal muscle tone coordination.  PSYCHIATRIC: Normal mood and affect. Normal behavior. Normal judgment and thought  content. RESPIRATORY: Effort normal, no problems with respiration noted  Bedside Ultrasound Documentation Pt informed that the ultrasound is considered a limited OB ultrasound and is not intended to be a complete ultrasound exam.  Patient also informed that the ultrasound is not being completed with the intent of assessing for fetal or placental anomalies or any pelvic abnormalities.  Explained that the purpose of today's ultrasound is to assess for  viability.  Patient acknowledges the purpose of the exam and the limitations of the study.    My Read: IUGS, likely fetal pole, no cardiac activity appreciated    Labs and Imaging No results found for this or any previous visit (from the past 168 hour(s)). No results found.    Assessment and Plan:   Problem List Items Addressed This Visit       Other   Supervision of other normal pregnancy, antepartum - Primary   Relevant Orders   US OB LESS THAN 14 WEEKS WITH OB TRANSVAGINAL   Other Visit Diagnoses     Pregnancy with uncertain fetal viability, single or unspecified fetus          Discussed with patient that viability of pregnancy is uncertain at this time. As she was on Depo prior it is possible her dates were off but if she is truly [redacted] wks gestation then this would be highly concerning for nonviable pregnancy. Recommended we obtain a OB TVUS asap to evaluate and get formal measurements, can make a plan once we  have that result. Patient amenable with that plan, scheduled for outpatient [place] this afternoon at [facility].    Return for pending [place] results.        Total face-to-face time with patient: 20 minutes.  Over 50% of encounter was spent on counseling and coordination of care.   Venora Maples, MD/MPH Attending Family Medicine Physician, [facility] for [facility], [facility]

## 2021-09-23 ENCOUNTER — Telehealth: Payer: Self-pay | Admitting: Family Medicine

## 2021-09-23 DIAGNOSIS — O0289 Other abnormal products of conception: Secondary | ICD-10-CM

## 2021-09-23 NOTE — Telephone Encounter (Signed)
Called patient and confirmed identity with two markers.   Called to discuss results of her OB TVUS from yesterday. Unfortunately this showed a non-viable pregnancy. Patient understandably very upset about this news.   Patient did not have any immediate questions. I asked to review her management options of expectant, medical, or surgical management. After counseling she elected for expectant management. Reviewed that cramping, bleeding are normal. Reviewed warning signs of heavy vaginal bleeding soaking through >1 pad per hour, crescendo abdominal pain, and fever. . Blood type is unknown at present.  Clinical pool please call patient tomorrow after she's had some time to process and let her know we need her to come in for an ABO/Rh when she's able to prior to her already scheduled SAB follow up visit. I have already placed a future order.

## 2021-09-27 NOTE — Telephone Encounter (Signed)
Call placed to pt. Spoke with pt. Pt given update on needed lab work this week. Pt agreeable to plan of care. Pt will let me know through mychart for what day and time works best.  Colletta Maryland, [facility]

## 2021-09-29 ENCOUNTER — Other Ambulatory Visit: Payer: Medicaid Other

## 2021-09-29 DIAGNOSIS — O0289 Other abnormal products of conception: Secondary | ICD-10-CM

## 2021-09-30 LAB — ABO AND RH: Rh Factor: POSITIVE

## 2021-10-04 ENCOUNTER — Other Ambulatory Visit: Payer: Self-pay | Admitting: Family Medicine

## 2021-10-04 ENCOUNTER — Telehealth: Payer: Self-pay

## 2021-10-04 DIAGNOSIS — O0289 Other abnormal products of conception: Secondary | ICD-10-CM

## 2021-10-04 MED ORDER — MISOPROSTOL 200 MCG PO TABS: 800.0000 ug | ORAL_TABLET | Freq: Once | ORAL | 0 refills | Status: DC

## 2021-10-04 MED ORDER — ONDANSETRON 4 MG PO TBDP: 4.0000 mg | ORAL_TABLET | Freq: Four times a day (QID) | ORAL | 0 refills | Status: DC | PRN

## 2021-10-04 MED ORDER — OXYCODONE HCL 5 MG PO TABS: 5.0000 mg | ORAL_TABLET | ORAL | 0 refills | Status: DC | PRN

## 2021-10-04 NOTE — Progress Notes (Signed)
Misoprostol orders for nonviable pregnancy management

## 2021-10-04 NOTE — Telephone Encounter (Signed)
Pt called and states wanted to get medication for SAB. Spoke with Dr Dione Plover. Sent in meds to pharmacy on file. Pt aware.  Colletta Maryland, RNC

## 2021-10-07 ENCOUNTER — Telehealth: Payer: Self-pay

## 2021-10-07 NOTE — Telephone Encounter (Signed)
Called pt in response to MyChart message. Pt states she is still experiencing abdominal pain. Reports taking Cytotec on 10/05/21. Reports heavy vaginal bleeding on 10/06/21. Pt has finished all oxycodone. Taking Ibuprofen when she feels pain, but does not feel it's controlling pain. Recommended patient take ibuprofen 600 mg every 6 hours and Tylenol 1000 mg as needed in between. Pt will update office tomorrow AM

## 2021-10-08 ENCOUNTER — Encounter: Payer: Self-pay | Admitting: Family Medicine

## 2021-10-08 ENCOUNTER — Other Ambulatory Visit: Payer: Self-pay

## 2021-10-08 ENCOUNTER — Ambulatory Visit (INDEPENDENT_AMBULATORY_CARE_PROVIDER_SITE_OTHER): Payer: Medicaid Other | Admitting: Family Medicine

## 2021-10-08 DIAGNOSIS — O039 Complete or unspecified spontaneous abortion without complication: Secondary | ICD-10-CM

## 2021-10-08 NOTE — Progress Notes (Signed)
GYNECOLOGY OFFICE VISIT NOTE  History:   Emily Golden is a 21 y.o. G1P0 here today for SAB follow up.  Diagnosed with SAB 09/23/2021, elected for management with misoprostol last week which she took day of prescription on 10/04/2021 She reports initially very heavy bleeding and severe abdominal cramps Today she has only changed pad once, bleeding has lightened significantly and she does not have any abdominal pain She remains very sad about her miscarriage She is wanting to get pregnant again soon but her husband does not want to try  Declines birth control besides barrier protection  Health Maintenance Due  Topic Date Due   COVID-19 Vaccine (1) Never done   HPV VACCINES (1 - 2-dose series) Never done   CHLAMYDIA SCREENING  06/10/2019   Hepatitis C Screening  Never done   TETANUS/TDAP  Never done   INFLUENZA VACCINE  08/03/2021    Past Medical History:  Diagnosis Date   Allergy    Anxiety    Asthma     Past Surgical History:  Procedure Laterality Date   NASAL RECONSTRUCTION      The following portions of the patient's history were reviewed and updated as appropriate: allergies, current medications, past family history, past medical history, past social history, past surgical history and problem list.   Health Maintenance:   Last pap: No results found for: "DIAGPAP", "HPV", "HPVHIGH" N/a  Last mammogram:  N/a   Review of Systems:  Pertinent items noted in HPI and remainder of comprehensive ROS otherwise negative.  Physical Exam:  BP 132/89   Pulse 77   Wt 260 lb 9.6 oz (118.2 kg)   LMP 06/29/2021   Breastfeeding Unknown   BMI 40.21 kg/m  CONSTITUTIONAL: Well-developed, well-nourished female in no acute distress.  HEENT:  Normocephalic, atraumatic. External right and left ear normal. No scleral icterus.  NECK: Normal range of motion, supple, no masses noted on observation SKIN: No rash noted. Not diaphoretic. No erythema. No pallor. MUSCULOSKELETAL: Normal  range of motion. No edema noted. NEUROLOGIC: Alert and oriented to person, place, and time. Normal muscle tone coordination.  PSYCHIATRIC: Sad mood and affect. Normal behavior. Normal judgment and thought content. RESPIRATORY: Effort normal, no problems with respiration noted   Labs and Imaging No results found for this or any previous visit (from the past 168 hour(s)). US OB LESS THAN 14 WEEKS WITH OB TRANSVAGINAL  Result Date: 09/23/2021 CLINICAL DATA:  Positive urine pregnancy test.  Assess viability EXAM: OBSTETRIC <14 WK [place] AND TRANSVAGINAL OB US TECHNIQUE: Both transabdominal and transvaginal ultrasound examinations were performed for complete evaluation of the gestation as well as the maternal uterus, adnexal regions, and pelvic cul-de-sac. Transvaginal technique was performed to assess early pregnancy. COMPARISON:  None Available. FINDINGS: Intrauterine gestational sac: Single Yolk sac:  Not Visualized. Embryo:  Visualized. Cardiac Activity: Not Visualized. Heart Rate: 0 bpm CRL:  14.8 mm   7 w   6 d Subchorionic hemorrhage:  Small subchorionic hemorrhage. Maternal uterus/adnexae: Bilateral ovaries and adnexal regions within normal limits. No free fluid within the pelvis. IMPRESSION: Intrauterine gestational sac containing embryo measuring 14.8 mm by crown-rump length. No cardiac activity. Findings meet definitive criteria for failed pregnancy. This follows SRU consensus guidelines: Diagnostic Criteria for Nonviable Pregnancy Early in the First Trimester. [name] J Med [phone]. These results were discussed by telephone at the time of interpretation on 09/23/2021 at 4:12 pm with provider Federico Maiorino , who verbally acknowledged these results. Electronically Signed   By: Janyth Pupa  Plundo D.O.   On: 09/23/2021 16:12      Assessment and Plan:   Problem List Items Addressed This Visit       Other   Miscarriage    Patient is s/p what appears to be successful medication management with  misoprostol. Reassured patient that miscarriage is common with ~1/4 of women experiencing it in their lifetime. Reassured patient that there is nothing she did or did not do to cause this. Reviewed most common reason is presumed to be genetic abnormalities that allow a pregnancy to start but not continue past an early stage, but realistically we do not know the cause in most cases. Reviewed that studies show no definite difference between attempting another pregnancy sooner vs waiting, though some studies do show better live birth outcomes with trying sooner. Blood type O/Positive/-- (09/27 0905), rhogam was not indicated. Patient declines contraception. Mother inquiring about grief groups, unfortunately I am not aware of any in the area. She is already connected with mental health services through [place] and reports she has an appt to see her therapist soon.        Routine preventative health maintenance measures emphasized. Please refer to After Visit Summary for other counseling recommendations.   Return if symptoms worsen or fail to improve.    Total face-to-face time with patient: 20 minutes.  Over 50% of encounter was spent on counseling and coordination of care.   Clarnce Flock, MD/MPH Attending Family Medicine Physician, [facility] for [facility], [place]

## 2021-10-08 NOTE — Assessment & Plan Note (Signed)
Patient is s/p what appears to be successful medication management with misoprostol. Reassured patient that miscarriage is common with ~1/4 of women experiencing it in their lifetime. Reassured patient that there is nothing she did or did not do to cause this. Reviewed most common reason is presumed to be genetic abnormalities that allow a pregnancy to start but not continue past an early stage, but realistically we do not know the cause in most cases. Reviewed that studies show no definite difference between attempting another pregnancy sooner vs waiting, though some studies do show better live birth outcomes with trying sooner. Blood type O/Positive/-- (09/27 0905), rhogam was not indicated. Patient declines contraception. Mother inquiring about grief groups, unfortunately I am not aware of any in the area. She is already connected with mental health services through [place] and reports she has an appt to see her therapist soon.

## 2021-10-08 NOTE — Progress Notes (Signed)
Patient states that she is still bleeding and having a lot of sharp pains in her lower abdomin. Changing pads every 1-2 hours but starting today her bleeding has let up a little bit. Passing clots. Per patient last night she passed a very large clot but continued with pain.  Pain level last night was at a 6, today she is not having any pain currently. Taking ibuprofen and tylenol, completed all of her Oxy. Feels like tylenol and ibuprofen is not working well.    Declined IBH services at this time.   Altha Harm, CMA

## 2021-11-09 ENCOUNTER — Ambulatory Visit: Payer: Medicaid Other

## 2021-12-08 ENCOUNTER — Emergency Department (HOSPITAL_COMMUNITY)
Admission: EM | Admit: 2021-12-08 | Discharge: 2021-12-08 | Disposition: A | Discharge status: Home / Self Care | Payer: Medicaid Other | Attending: Emergency Medicine | Admitting: Emergency Medicine

## 2021-12-08 ENCOUNTER — Other Ambulatory Visit: Payer: Self-pay

## 2021-12-08 ENCOUNTER — Emergency Department (HOSPITAL_COMMUNITY): Payer: Medicaid Other

## 2021-12-08 DIAGNOSIS — Z91040 Latex allergy status: Secondary | ICD-10-CM | POA: Diagnosis not present

## 2021-12-08 DIAGNOSIS — N924 Excessive bleeding in the premenopausal period: Secondary | ICD-10-CM

## 2021-12-08 DIAGNOSIS — N946 Dysmenorrhea, unspecified: Secondary | ICD-10-CM

## 2021-12-08 DIAGNOSIS — R102 Pelvic and perineal pain: Secondary | ICD-10-CM | POA: Diagnosis present

## 2021-12-08 LAB — CBC WITH DIFFERENTIAL/PLATELET
Abs Immature Granulocytes: 0.03 10*3/uL (ref 0.00–0.07)
Basophils Absolute: 0 10*3/uL (ref 0.0–0.1)
Basophils Relative: 0 %
Eosinophils Absolute: 0 10*3/uL (ref 0.0–0.5)
Eosinophils Relative: 1 %
HCT: 38.7 % (ref 36.0–46.0)
Hemoglobin: 13 g/dL (ref 12.0–15.0)
Immature Granulocytes: 0 %
Lymphocytes Relative: 29 %
Lymphs Abs: 2.2 10*3/uL (ref 0.7–4.0)
MCH: 28.3 pg (ref 26.0–34.0)
MCHC: 33.6 g/dL (ref 30.0–36.0)
MCV: 84.3 fL (ref 80.0–100.0)
Monocytes Absolute: 0.4 10*3/uL (ref 0.1–1.0)
Monocytes Relative: 5 %
Neutro Abs: 4.8 10*3/uL (ref 1.7–7.7)
Neutrophils Relative %: 65 %
Platelets: 273 10*3/uL (ref 150–400)
RBC: 4.59 MIL/uL (ref 3.87–5.11)
RDW: 13 % (ref 11.5–15.5)
WBC: 7.5 10*3/uL (ref 4.0–10.5)
nRBC: 0 % (ref 0.0–0.2)

## 2021-12-08 LAB — COMPREHENSIVE METABOLIC PANEL
ALT: 19 U/L (ref 0–44)
AST: 19 U/L (ref 15–41)
Albumin: 3.8 g/dL (ref 3.5–5.0)
Alkaline Phosphatase: 71 U/L (ref 38–126)
Anion gap: 6 (ref 5–15)
BUN: 11 mg/dL (ref 6–20)
CO2: 23 mmol/L (ref 22–32)
Calcium: 9.3 mg/dL (ref 8.9–10.3)
Chloride: 111 mmol/L (ref 98–111)
Creatinine, Ser: 0.66 mg/dL (ref 0.44–1.00)
GFR, Estimated: >60 mL/min (ref >60)
Glucose, Bld: 87 mg/dL (ref 70–99)
Potassium: 3.2 mmol/L — ABNORMAL LOW (ref 3.5–5.1)
Sodium: 140 mmol/L (ref 135–145)
Total Bilirubin: 0.7 mg/dL (ref 0.3–1.2)
Total Protein: 7.2 g/dL (ref 6.5–8.1)

## 2021-12-08 LAB — URINALYSIS, ROUTINE W REFLEX MICROSCOPIC
Bilirubin Urine: NEGATIVE
Glucose, UA: NEGATIVE mg/dL
Ketones, ur: NEGATIVE mg/dL
Leukocytes,Ua: NEGATIVE
Nitrite: NEGATIVE
Protein, ur: NEGATIVE mg/dL
Specific Gravity, Urine: >1.03 — ABNORMAL HIGH (ref 1.005–1.030)
pH: 5.5 (ref 5.0–8.0)

## 2021-12-08 LAB — URINALYSIS, MICROSCOPIC (REFLEX): RBC / HPF: >50 RBC/hpf (ref 0–5)

## 2021-12-08 LAB — HCG, QUANTITATIVE, PREGNANCY: hCG, Beta Chain, Quant, S: 2 m[IU]/mL (ref <5)

## 2021-12-08 NOTE — ED Triage Notes (Signed)
Pt presents with pelvic pain since last night. Miscarriage about 6 weeks ago.  States she called her doctor and was told to follow up in the ED in case she had retained products from her miscarriage.  States she did have a period recently that was heavier and more painful than normal.

## 2021-12-08 NOTE — ED Provider Notes (Signed)
[facility] EMERGENCY DEPARTMENT Provider Note   CSN: [id number] Arrival date & time: 12/08/21  1313     History  Chief Complaint  Patient presents with   Pelvic Pain    Emily Golden is a 21 y.o. female recent miscarriage here presenting with pelvic pain.  Patient was given misoprostol around 2 months ago for miscarriage.  She states that she had a lot of bleeding right afterwards.  She states that she was doing well and started having her menstrual cycle this week.  She states that the cramping is more severe than usual.  She is also bleeding more.  Denies any vaginal discharge.  Patient called her OB doctor and was sent in for ultrasound to rule out retained products.  Patient states that she did not have any procedure done for miscarriage and just had misoprostol.  The history is provided by the patient.       Home Medications Prior to Admission medications   Medication Sig Start Date End Date Taking? Authorizing Provider  ARIPiprazole (ABILIFY) 15 MG tablet Take 0.5 tablets (7.5 mg total) by mouth daily. Patient taking differently: Take 15 mg by mouth daily. 07/06/21 12/08/21 Yes Massengill, Harrold Donath, MD  beclomethasone (QVAR) 80 MCG/ACT inhaler Inhale 2 puffs into the lungs daily.   Yes [provider]  hydrOXYzine (ATARAX) 50 MG tablet Take 150 mg by mouth daily.   Yes [provider]  hydrOXYzine (VISTARIL) 25 MG capsule Take 75 mg by mouth every 8 (eight) hours as needed. 10/14/21  Yes [provider]  ibuprofen (ADVIL) 800 MG tablet Take 800 mg by mouth every 8 (eight) hours as needed for mild pain.   Yes [provider]  SYMBICORT 160-4.5 MCG/ACT inhaler Inhale 2 puffs into the lungs daily as needed. 04/21/21  Yes [provider]  fluticasone (FLONASE) 50 MCG/ACT nasal spray Place 1 spray into both nostrils daily for 3 days. Patient not taking: Reported on 12/08/2021 08/09/21 09/22/21  Gustavus Bryant, FNP  misoprostol  (CYTOTEC) 200 MCG tablet Take 4 tablets (800 mcg total) by mouth once for 1 dose. 10/04/21 10/04/21  Venora Maples, MD  ondansetron (ZOFRAN-ODT) 4 MG disintegrating tablet Take 1 tablet (4 mg total) by mouth every 6 (six) hours as needed for nausea. Patient not taking: Reported on 12/08/2021 10/04/21   Venora Maples, MD  oxyCODONE (ROXICODONE) 5 MG immediate release tablet Take 1 tablet (5 mg total) by mouth every 4 (four) hours as needed for severe pain. Patient not taking: Reported on 12/08/2021 10/04/21   Venora Maples, MD      Allergies    Other, Strawberry (diagnostic), Strawberry extract, and Latex    Review of Systems   Review of Systems  Genitourinary:  Positive for pelvic pain.  All other systems reviewed and are negative.   Physical Exam Updated Vital Signs BP 137/77 (BP Location: Left Arm)   Pulse 71   Temp 98.2 F (36.8 C) (Oral)   Resp 16   SpO2 100%  Physical Exam Vitals and nursing note reviewed.  Constitutional:      Appearance: Normal appearance.  HENT:     Head: Normocephalic.     Nose: Nose normal.     Mouth/Throat:     Mouth: Mucous membranes are moist.  Eyes:     Extraocular Movements: Extraocular movements intact.     Pupils: Pupils are equal, round, and reactive to light.  Cardiovascular:     Rate and Rhythm: Normal  rate and regular rhythm.     Pulses: Normal pulses.     Heart sounds: Normal heart sounds.  Pulmonary:     Effort: Pulmonary effort is normal.     Breath sounds: Normal breath sounds.  Abdominal:     General: Abdomen is flat.     Palpations: Abdomen is soft.  Musculoskeletal:        General: Normal range of motion.     Cervical back: Normal range of motion.  Skin:    General: Skin is warm.     Capillary Refill: Capillary refill takes less than 2 seconds.  Neurological:     General: No focal deficit present.     Mental Status: She is oriented to person, place, and time.  Psychiatric:        Mood and Affect: Mood  normal.        Behavior: Behavior normal.     ED Results / Procedures / Treatments   Labs (all labs ordered are listed, but only abnormal results are displayed) Labs Reviewed  URINALYSIS, ROUTINE W REFLEX MICROSCOPIC - Abnormal; Notable for the following components:      Result Value   APPearance CLOUDY (*)    Specific Gravity, Urine >1.030 (*)    Hgb urine dipstick LARGE (*)    All other components within normal limits  COMPREHENSIVE METABOLIC PANEL - Abnormal; Notable for the following components:   Potassium 3.2 (*)    All other components within normal limits  URINALYSIS, MICROSCOPIC (REFLEX) - Abnormal; Notable for the following components:   Bacteria, UA RARE (*)    All other components within normal limits  HCG, QUANTITATIVE, PREGNANCY  CBC WITH DIFFERENTIAL/PLATELET    EKG None  Radiology US PELVIC COMPLETE W TRANSVAGINAL AND TORSION R/O  Result Date: 12/08/2021 CLINICAL DATA:  Initial evaluation for acute pelvic pain, evaluate for retained products of conception. EXAM: TRANSABDOMINAL AND TRANSVAGINAL ULTRASOUND OF PELVIS DOPPLER ULTRASOUND OF OVARIES TECHNIQUE: Both transabdominal and transvaginal ultrasound examinations of the pelvis were performed. Transabdominal technique was performed for global imaging of the pelvis including uterus, ovaries, adnexal regions, and pelvic cul-de-sac. It was necessary to proceed with endovaginal exam following the transabdominal exam to visualize the endometrium. Color and duplex Doppler ultrasound was utilized to evaluate blood flow to the ovaries. COMPARISON:  Prior ultrasound from 09/22/2021. FINDINGS: Uterus Measurements: 7.0 x 3.8 x 4.6 cm = volume: 63.5 mL. Uterus is anteverted. No discrete fibroid or other myometrial abnormality. Endometrium Thickness: 6.1 mm.  No focal abnormality or abnormal vascularity. Right ovary Measurements: 2.8 x 1.7 x 2.3 cm = volume: 5.8 mL. Normal appearance/no adnexal mass. Left ovary Measurements: 2.8 x  1.5 x 1.9 cm = volume: 4.2 mL. Normal appearance/no adnexal mass. Pulsed Doppler evaluation of both ovaries demonstrates normal low-resistance arterial and venous waveforms. Other findings No abnormal free fluid. IMPRESSION: 1. Normal pelvic ultrasound. Endometrial stripe measures 6.1 mm in thickness, with no sonographic features of retained products of conception. 2. No evidence for ovarian torsion or other acute abnormality. Electronically Signed   By: Rise Mu M.D.   On: 12/08/2021 18:49    Procedures Procedures    Medications Ordered in ED Medications - No data to display  ED Course/ Medical Decision Making/ A&P                           Medical Decision Making Mima Cranmore is a 21 y.o. female presenting with pelvic pain.  Patient  has pelvic pain and vaginal bleeding and cramps.  I have reviewed patient's chart from 2 months ago.  Patient was given misoprostol at that time.  Patient is abdomen is nontender.  CBC is stable.  Ultrasound showed no retained products.  I reassured patient and told her that this is likely side effect from the subcostal.  She can take some Motrin as needed.  Stable for discharge.   Problems Addressed: Dysmenorrhea: acute illness or injury Excessive bleeding in premenopausal period: acute illness or injury  Amount and/or Complexity of Data Reviewed Labs: ordered. Decision-making details documented in ED Course. Radiology: ordered and independent interpretation performed. Decision-making details documented in ED Course.    Final Clinical Impression(s) / ED Diagnoses Final diagnoses:  Dysmenorrhea  Excessive bleeding in premenopausal period    Rx / DC Orders ED Discharge Orders     None         Charlynne Pander, MD 12/08/21 2048

## 2021-12-08 NOTE — ED Provider Triage Note (Signed)
Emergency Medicine Provider Triage Evaluation Note  Emily Golden , a 21 y.o. female  was evaluated in triage.  Pt complains of pelvic pain worsening since last night. She denies any urinary symptoms. Denies any vaginal symptoms. She was given "a pill" around 6-8 weeks ago for a miscarriage. She was sent in by her OB to r/o retained parts of fetal conception.  Review of Systems  Positive:  Negative:   Physical Exam  BP (!) 142/87 (BP Location: Right Arm)   Pulse 92   Temp 98 F (36.7 C)   Resp 16   SpO2 97%  Gen:   Awake, no distress   Resp:  Normal effort  MSK:   Moves extremities without difficulty  Other:  Abdomen soft and non tender  Medical Decision Making  Medically screening exam initiated at 4:07 PM.  Appropriate orders placed.  Juanette Morton was informed that the remainder of the evaluation will be completed by another provider, this initial triage assessment does not replace that evaluation, and the importance of remaining in the ED until their evaluation is complete.  MAU declined the patient as it has been 6-8 weeks since the miscarriage.    Achille Rich, PA-C 12/08/21 1609

## 2021-12-08 NOTE — Discharge Instructions (Signed)
Your cramps are worse likely from the misoprostol  As we discussed, your ultrasound and your lab work were unremarkable today.  In particular you do not have any retained products.  Take Motrin for cramps.  See your GYN doctor for follow-up  Return to ER if you have worse abdominal cramps, vomiting, fever

## 2022-03-02 ENCOUNTER — Ambulatory Visit
Admission: RE | Admit: 2022-03-02 | Discharge: 2022-03-02 | Disposition: A | Discharge status: Home / Self Care | Payer: Medicaid Other | Source: Ambulatory Visit | Attending: Internal Medicine | Admitting: Internal Medicine

## 2022-03-02 VITALS — BP 149/85 | HR 77 | Temp 97.8°F | Resp 18 | Ht 67.5 in | Wt 270.0 lb

## 2022-03-02 DIAGNOSIS — M545 Low back pain, unspecified: Secondary | ICD-10-CM

## 2022-03-02 DIAGNOSIS — Z3202 Encounter for pregnancy test, result negative: Secondary | ICD-10-CM

## 2022-03-02 DIAGNOSIS — N898 Other specified noninflammatory disorders of vagina: Secondary | ICD-10-CM | POA: Diagnosis present

## 2022-03-02 DIAGNOSIS — R350 Frequency of micturition: Secondary | ICD-10-CM | POA: Diagnosis present

## 2022-03-02 LAB — POCT URINALYSIS DIP (MANUAL ENTRY)
Bilirubin, UA: NEGATIVE
Blood, UA: NEGATIVE
Glucose, UA: NEGATIVE mg/dL
Ketones, POC UA: NEGATIVE mg/dL
Leukocytes, UA: NEGATIVE
Nitrite, UA: NEGATIVE
Protein Ur, POC: NEGATIVE mg/dL
Spec Grav, UA: 1.015 (ref 1.010–1.025)
Urobilinogen, UA: 0.2 E.U./dL
pH, UA: 8 (ref 5.0–8.0)

## 2022-03-02 LAB — POCT URINE PREGNANCY: Preg Test, Ur: NEGATIVE

## 2022-03-02 MED ORDER — LIDOCAINE 5 % EX PTCH: 1.0000 | MEDICATED_PATCH | CUTANEOUS | 0 refills | Status: DC

## 2022-03-02 NOTE — Discharge Instructions (Signed)
Urine was normal.  Urine pregnancy test was negative.  Vaginal swab is pending.  Will call if it is abnormal.  Please follow up with urgent care or PCP if urinary frequency or symptoms persist despite results.  I have prescribed a lidocaine patch to apply directly to area of the back that is painful.  Follow-up with [facility] for further evaluation and management of back pain.

## 2022-03-02 NOTE — ED Provider Notes (Signed)
[name]    CSN: [id number] Arrival date & time: 03/02/22  0947      History   Chief Complaint Chief Complaint  Patient presents with   Back Pain    Back pain going down my legs - Entered by patient    HPI Emily Golden is a 22 y.o. female.   Patient presents with right-sided lower back pain that started a few days ago.  Patient reports history of chronic back pain.  States that her chronic back pain is typically located in various areas of the back.  She has seen a specialist prior approximately 1 to 1.5 years ago.  States that she was receiving physical therapy but was not able to completed given extenuating circumstances.  Reports that she is taking ibuprofen for pain with minimal improvement.  Pain does not radiate.  Denies numbness or tingling.  She does report that she has had some urinary frequency but denies dysuria, hematuria, abdominal pain, fever.  Patient does report some vaginal discharge that started a few days ago as well.  Denies concern or exposure for STD. Reports only sexually active with her husband.  She reports last menstrual cycle was 02/10/2022.  She does report that she is currently trying to get pregnant at this time.   Back Pain   Past Medical History:  Diagnosis Date   Allergy    Anxiety    Asthma     Patient Active Problem List   Diagnosis Date Noted   Miscarriage 09/21/2021   Bipolar 2 disorder ([place]) 07/02/2021   GAD (generalized anxiety disorder) 07/02/2021   ADHD 07/02/2021   Tobacco use disorder 07/02/2021   Alcohol consumption binge drinking 07/02/2021   Suicide attempt by drug ingestion ([place]) 06/09/2018   Ingestion of unknown substance 06/08/2018   PTSD (post-traumatic stress disorder)    Syncope 06/24/2015   Insulin resistance 06/24/2015   Morbid childhood obesity with BMI greater than 99th percentile for age [facility]) 06/24/2015    Past Surgical History:  Procedure Laterality Date   NASAL RECONSTRUCTION      OB History      Gravida  1   Para      Term      Preterm      AB  1   Living         SAB  1   IAB      Ectopic      Multiple      Live Births               Home Medications    Prior to Admission medications   Medication Sig Start Date End Date Taking? Authorizing Provider  beclomethasone (QVAR) 80 MCG/ACT inhaler Inhale 2 puffs into the lungs daily.   Yes [provider]  hydrOXYzine (ATARAX) 50 MG tablet Take 150 mg by mouth daily.   Yes [provider]  hydrOXYzine (VISTARIL) 25 MG capsule Take 75 mg by mouth every 8 (eight) hours as needed. 10/14/21  Yes [provider]  ibuprofen (ADVIL) 800 MG tablet Take 800 mg by mouth every 8 (eight) hours as needed for mild pain.   Yes [provider]  lidocaine (LIDODERM) 5 % Place 1 patch onto the skin daily. Remove & Discard patch within 12 hours or as directed by MD 03/02/22  Yes Teodora Medici, FNP  SYMBICORT 160-4.5 MCG/ACT inhaler Inhale 2 puffs into the lungs daily as needed. 04/21/21  Yes [provider]  ARIPiprazole (ABILIFY)  15 MG tablet Take 0.5 tablets (7.5 mg total) by mouth daily. Patient taking differently: Take 15 mg by mouth daily. 07/06/21 12/08/21  Massengill, Ovid Curd, MD  fluticasone (FLONASE) 50 MCG/ACT nasal spray Place 1 spray into both nostrils daily for 3 days. Patient not taking: Reported on 12/08/2021 08/09/21 09/22/21  Teodora Medici, FNP  misoprostol (CYTOTEC) 200 MCG tablet Take 4 tablets (800 mcg total) by mouth once for 1 dose. 10/04/21 10/04/21  Clarnce Flock, MD  ondansetron (ZOFRAN-ODT) 4 MG disintegrating tablet Take 1 tablet (4 mg total) by mouth every 6 (six) hours as needed for nausea. Patient not taking: Reported on 12/08/2021 10/04/21   Clarnce Flock, MD  oxyCODONE (ROXICODONE) 5 MG immediate release tablet Take 1 tablet (5 mg total) by mouth every 4 (four) hours as needed for severe pain. Patient not taking: Reported on 12/08/2021 10/04/21   Clarnce Flock, MD    Family History Family History  Problem Relation Age of Onset   Diabetes Maternal Grandmother    Hypertension Maternal Grandmother    Hyperlipidemia Maternal Grandmother    Heart disease Maternal Grandfather    Cancer Maternal Grandfather    Diabetes Maternal Grandfather    Heart failure Neg Hx     Social History Social History   Tobacco Use   Smoking status: Former    Types: Cigarettes    Quit date: 04/09/2018    Years since quitting: 3.8   Smokeless tobacco: Never   Tobacco comments:    Says no smoking for 2 months  Vaping Use   Vaping Use: Former  Substance Use Topics   Alcohol use: Yes    Alcohol/week: 6.0 standard drinks of alcohol    Types: 6 Glasses of wine per week    Comment: occ   Drug use: Not Currently    Types: Marijuana    Comment: cannabis use, last use 6 months ago     Allergies   Other, Strawberry (diagnostic), Strawberry extract, and Latex   Review of Systems Review of Systems Per HPI  Physical Exam Triage Vital Signs ED Triage Vitals  Enc Vitals Group     BP 03/02/22 1018 (!) 149/85     Pulse Rate 03/02/22 1018 77     Resp 03/02/22 1018 18     Temp 03/02/22 1018 97.8 F (36.6 C)     Temp Source 03/02/22 1018 Oral     SpO2 03/02/22 1018 99 %     Weight 03/02/22 1016 270 lb (122.5 kg)     Height 03/02/22 1016 5' 7.5" (1.715 m)     Head Circumference --      Peak Flow --      Pain Score 03/02/22 1016 6     Pain Loc --      Pain Edu? --      Excl. in [place]? --    No data found.  Updated Vital Signs BP (!) 149/85 (BP Location: Right Arm)   Pulse 77   Temp 97.8 F (36.6 C) (Oral)   Resp 18   Ht 5' 7.5" (1.715 m)   Wt 270 lb (122.5 kg)   LMP 02/10/2022   SpO2 99%   BMI 41.66 kg/m   Visual Acuity Right Eye Distance:   Left Eye Distance:   Bilateral Distance:    Right Eye Near:   Left Eye Near:    Bilateral Near:     Physical Exam Constitutional:      General: She is not  in acute distress.     Appearance: Normal appearance. She is not toxic-appearing or diaphoretic.  HENT:     Head: Normocephalic and atraumatic.  Eyes:     Extraocular Movements: Extraocular movements intact.     Conjunctiva/sclera: Conjunctivae normal.  Pulmonary:     Effort: Pulmonary effort is normal.  Genitourinary:    Comments: Deferred with shared decision making. Self swab performed.  Musculoskeletal:       Back:     Comments: Tenderness to palpation to right lower lumbar region.  No direct spinal tenderness, crepitus, step-off, discoloration, swelling noted.  Neurological:     General: No focal deficit present.     Mental Status: She is alert and oriented to person, place, and time. Mental status is at baseline.     Deep Tendon Reflexes: Reflexes are normal and symmetric.  Psychiatric:        Mood and Affect: Mood normal.        Behavior: Behavior normal.        Thought Content: Thought content normal.        Judgment: Judgment normal.      UC Treatments / Results  Labs (all labs ordered are listed, but only abnormal results are displayed) Labs Reviewed  POCT URINALYSIS DIP (MANUAL ENTRY)  POCT URINE PREGNANCY  CERVICOVAGINAL ANCILLARY ONLY    EKG   Radiology No results found.  Procedures Procedures (including critical care time)  Medications Ordered in UC Medications - No data to display  Initial Impression / Assessment and Plan / UC Course  I have reviewed the triage vital signs and the nursing notes.  Pertinent labs & imaging results that were available during my care of the patient were reviewed by me and considered in my medical decision making (see chart for details).     Suspect back pain is musculoskeletal in nature.  Given no direct injury or direct spinal tenderness, will defer imaging.  Limited options on pain management.  Patient already taken ibuprofen which has not been helpful.  Patient declined prednisone given that it "makes her heart race".  Risk associated  with muscle relaxer given that patient takes Abilify and hydroxyzine.  Therefore, will treat with lidocaine patch and supportive care.  Given back pain has been chronic in nature, patient advised to follow-up with established orthopedist for further evaluation and management.  Urinalysis completed given patient's report of urinary frequency to ensure back pain was not related.  UA was completely unremarkable.  Urine pregnancy test was also negative.  Patient reported vaginal discharge so cervicovaginal swab is pending.  Patient denies concern for STD but would like vaginal swab to test for STDs.  Will await result for any further treatment.  Advised patient to follow-up with urgent care or PCP if urinary frequency/vaginal discharge persists or worsens.  Patient verbalized understanding and was agreeable with plan. Final Clinical Impressions(s) / UC Diagnoses   Final diagnoses:  Acute right-sided low back pain without sciatica  Urinary frequency  Vaginal discharge  Urine pregnancy test negative     Discharge Instructions      Urine was normal.  Urine pregnancy test was negative.  Vaginal swab is pending.  Will call if it is abnormal.  Please follow up with urgent care or PCP if urinary frequency or symptoms persist despite results.  I have prescribed a lidocaine patch to apply directly to area of the back that is painful.  Follow-up with [facility] for further evaluation and management of back pain.  ED Prescriptions     Medication Sig Dispense Auth. Provider   lidocaine (LIDODERM) 5 % Place 1 patch onto the skin daily. Remove & Discard patch within 12 hours or as directed by MD 30 patch [place], Michele Rockers, FNP      PDMP not reviewed this encounter.   Teodora Medici, [place] 03/02/22 1112

## 2022-03-02 NOTE — ED Triage Notes (Signed)
Pt states that she has a history of back pain.

## 2022-03-03 LAB — CERVICOVAGINAL ANCILLARY ONLY
Bacterial Vaginitis (gardnerella): NEGATIVE
Candida Glabrata: NEGATIVE
Candida Vaginitis: NEGATIVE
Chlamydia: NEGATIVE
Comment: NEGATIVE
Comment: NEGATIVE
Comment: NEGATIVE
Comment: NEGATIVE
Comment: NEGATIVE
Comment: NORMAL
Neisseria Gonorrhea: NEGATIVE
Trichomonas: NEGATIVE

## 2022-03-07 ENCOUNTER — Other Ambulatory Visit: Payer: Self-pay

## 2022-03-07 ENCOUNTER — Emergency Department (HOSPITAL_COMMUNITY)
Admission: EM | Admit: 2022-03-07 | Discharge: 2022-03-07 | Disposition: A | Discharge status: Home / Self Care | Payer: Medicaid Other | Attending: Emergency Medicine | Admitting: Emergency Medicine

## 2022-03-07 ENCOUNTER — Encounter (HOSPITAL_COMMUNITY): Payer: Self-pay | Admitting: Emergency Medicine

## 2022-03-07 DIAGNOSIS — F4381 Prolonged grief disorder: Secondary | ICD-10-CM | POA: Insufficient documentation

## 2022-03-07 DIAGNOSIS — F4321 Adjustment disorder with depressed mood: Secondary | ICD-10-CM | POA: Insufficient documentation

## 2022-03-07 DIAGNOSIS — Z91040 Latex allergy status: Secondary | ICD-10-CM | POA: Insufficient documentation

## 2022-03-07 DIAGNOSIS — J45909 Unspecified asthma, uncomplicated: Secondary | ICD-10-CM | POA: Diagnosis not present

## 2022-03-07 DIAGNOSIS — Z7951 Long term (current) use of inhaled steroids: Secondary | ICD-10-CM | POA: Diagnosis not present

## 2022-03-07 DIAGNOSIS — F3181 Bipolar II disorder: Secondary | ICD-10-CM | POA: Diagnosis present

## 2022-03-07 DIAGNOSIS — Y903 Blood alcohol level of 60-79 mg/100 ml: Secondary | ICD-10-CM | POA: Insufficient documentation

## 2022-03-07 DIAGNOSIS — R45851 Suicidal ideations: Secondary | ICD-10-CM | POA: Insufficient documentation

## 2022-03-07 DIAGNOSIS — F1010 Alcohol abuse, uncomplicated: Secondary | ICD-10-CM | POA: Insufficient documentation

## 2022-03-07 HISTORY — DX: Bipolar disorder, unspecified: F31.9

## 2022-03-07 LAB — CBC
HCT: 40 % (ref 36.0–46.0)
Hemoglobin: 12.9 g/dL (ref 12.0–15.0)
MCH: 27.4 pg (ref 26.0–34.0)
MCHC: 32.3 g/dL (ref 30.0–36.0)
MCV: 84.9 fL (ref 80.0–100.0)
Platelets: 261 10*3/uL (ref 150–400)
RBC: 4.71 MIL/uL (ref 3.87–5.11)
RDW: 12.4 % (ref 11.5–15.5)
WBC: 8.1 10*3/uL (ref 4.0–10.5)
nRBC: 0 % (ref 0.0–0.2)

## 2022-03-07 LAB — COMPREHENSIVE METABOLIC PANEL
ALT: 28 U/L (ref 0–44)
AST: 28 U/L (ref 15–41)
Albumin: 3.7 g/dL (ref 3.5–5.0)
Alkaline Phosphatase: 97 U/L (ref 38–126)
Anion gap: 10 (ref 5–15)
BUN: 9 mg/dL (ref 6–20)
CO2: 21 mmol/L — ABNORMAL LOW (ref 22–32)
Calcium: 9.3 mg/dL (ref 8.9–10.3)
Chloride: 110 mmol/L (ref 98–111)
Creatinine, Ser: 0.58 mg/dL (ref 0.44–1.00)
GFR, Estimated: >60 mL/min (ref >60)
Glucose, Bld: 95 mg/dL (ref 70–99)
Potassium: 3.5 mmol/L (ref 3.5–5.1)
Sodium: 141 mmol/L (ref 135–145)
Total Bilirubin: 0.2 mg/dL — ABNORMAL LOW (ref 0.3–1.2)
Total Protein: 7.1 g/dL (ref 6.5–8.1)

## 2022-03-07 LAB — RAPID URINE DRUG SCREEN, HOSP PERFORMED
Amphetamines: NOT DETECTED
Barbiturates: NOT DETECTED
Benzodiazepines: NOT DETECTED
Cocaine: NOT DETECTED
Opiates: NOT DETECTED
Tetrahydrocannabinol: NOT DETECTED

## 2022-03-07 LAB — I-STAT BETA HCG BLOOD, ED (MC, WL, AP ONLY): I-stat hCG, quantitative: <5 m[IU]/mL (ref <5)

## 2022-03-07 LAB — ETHANOL: Alcohol, Ethyl (B): 63 mg/dL — ABNORMAL HIGH (ref <10)

## 2022-03-07 LAB — ACETAMINOPHEN LEVEL: Acetaminophen (Tylenol), Serum: <10 ug/mL — ABNORMAL LOW (ref 10–30)

## 2022-03-07 LAB — SALICYLATE LEVEL: Salicylate Lvl: <7 mg/dL — ABNORMAL LOW (ref 7.0–30.0)

## 2022-03-07 MED ORDER — ACETAMINOPHEN 325 MG PO TABS: 650.0000 mg | ORAL_TABLET | ORAL | Status: DC | PRN

## 2022-03-07 MED ORDER — CARIPRAZINE HCL 1.5 MG PO CAPS
1.5000 mg | ORAL_CAPSULE | Freq: Every day | ORAL | Status: DC
  Administered 2022-03-07: 1.5 mg via ORAL
  Filled 2022-03-07: qty 1

## 2022-03-07 MED ORDER — CARIPRAZINE HCL 1.5 MG PO CAPS: 1.5000 mg | ORAL_CAPSULE | Freq: Every day | ORAL | 0 refills | Status: DC

## 2022-03-07 MED ORDER — ALUM & MAG HYDROXIDE-SIMETH 200-200-20 MG/5ML PO SUSP: 30.0000 mL | Freq: Four times a day (QID) | ORAL | Status: DC | PRN

## 2022-03-07 MED ORDER — ONDANSETRON HCL 4 MG PO TABS: 4.0000 mg | ORAL_TABLET | Freq: Three times a day (TID) | ORAL | Status: DC | PRN

## 2022-03-07 NOTE — ED Notes (Signed)
Called staffing for a sitter, per staffing no sitters at this time. Notified CN

## 2022-03-07 NOTE — Discharge Instructions (Signed)

## 2022-03-07 NOTE — ED Notes (Signed)
Voluntary consent form attached to clipboard in purple zone

## 2022-03-07 NOTE — ED Notes (Signed)
Pt dressed out in scrubs

## 2022-03-07 NOTE — ED Provider Notes (Signed)
Emergency Medicine Observation Re-evaluation Note  Emily Golden is a 22 y.o. female, seen on rounds today.  Pt initially presented to the ED for complaints of Suicidal Currently, the patient is alert and oriented without complaint.  Physical Exam  BP 136/72 (BP Location: Right Arm)   Pulse (!) 111   Temp 98 F (36.7 C) (Oral)   Resp 17   Ht 1.715 m (5' 7.5")   Wt 122.5 kg   LMP 02/10/2022 (Exact Date)   SpO2 99%   BMI 41.66 kg/m  Physical Exam General: wdwn female Cardiac: rrr- tahcycardia not noted on my exam Lungs: no distress Psych: states she is not suicidal or planning harm to herself  ED Course / MDM  EKG:   I have reviewed the labs performed to date as well as medications administered while in observation.  Recent changes in the last 24 hours include cleared by psych.  Plan  Current plan is for discharge with op f/u.    Emily Boss, MD 03/07/22 1150

## 2022-03-07 NOTE — ED Provider Notes (Signed)
[facility] Provider Note   CSN: [id number] Arrival date & time: 03/07/22  0143     History  Chief Complaint  Patient presents with   Suicidal    Emily Golden is a 22 y.o. female.  22 year old female presents with complaint of suicidal ideation intermittent for the past few months, worse for the past day with plan to overdose on pills, related to prior miscarriage in October at 14 weeks. Admits to alcohol use tonight, denies drug use. Denies any other complaints or concerns. History of bipolar disorder, reports compliance with Abilify and hydroxyzine.        Home Medications Prior to Admission medications   Medication Sig Start Date End Date Taking? Authorizing Provider  ARIPiprazole (ABILIFY) 15 MG tablet Take 0.5 tablets (7.5 mg total) by mouth daily. Patient taking differently: Take 15 mg by mouth daily. 07/06/21 12/08/21  Massengill, Ovid Curd, MD  beclomethasone (QVAR) 80 MCG/ACT inhaler Inhale 2 puffs into the lungs daily.    [provider]  fluticasone (FLONASE) 50 MCG/ACT nasal spray Place 1 spray into both nostrils daily for 3 days. Patient not taking: Reported on 12/08/2021 08/09/21 09/22/21  Teodora Medici, FNP  hydrOXYzine (ATARAX) 50 MG tablet Take 150 mg by mouth daily.    [provider]  hydrOXYzine (VISTARIL) 25 MG capsule Take 75 mg by mouth every 8 (eight) hours as needed. 10/14/21   [provider]  ibuprofen (ADVIL) 800 MG tablet Take 800 mg by mouth every 8 (eight) hours as needed for mild pain.    [provider]  lidocaine (LIDODERM) 5 % Place 1 patch onto the skin daily. Remove & Discard patch within 12 hours or as directed by MD 03/02/22   Teodora Medici, FNP  misoprostol (CYTOTEC) 200 MCG tablet Take 4 tablets (800 mcg total) by mouth once for 1 dose. 10/04/21 10/04/21  Clarnce Flock, MD  ondansetron (ZOFRAN-ODT) 4 MG disintegrating tablet Take 1 tablet (4 mg total) by mouth every 6  (six) hours as needed for nausea. Patient not taking: Reported on 12/08/2021 10/04/21   Clarnce Flock, MD  oxyCODONE (ROXICODONE) 5 MG immediate release tablet Take 1 tablet (5 mg total) by mouth every 4 (four) hours as needed for severe pain. Patient not taking: Reported on 12/08/2021 10/04/21   Clarnce Flock, MD  SYMBICORT 160-4.5 MCG/ACT inhaler Inhale 2 puffs into the lungs daily as needed. 04/21/21   [provider]      Allergies    Other, Strawberry (diagnostic), Strawberry extract, and Latex    Review of Systems   Review of Systems Negative except as per HPI Physical Exam Updated Vital Signs BP 136/72 (BP Location: Right Arm)   Pulse (!) 111   Temp 98 F (36.7 C) (Oral)   Resp 17   Ht 5' 7.5" (1.715 m)   Wt 122.5 kg   LMP 02/10/2022 (Exact Date)   SpO2 99%   BMI 41.66 kg/m  Physical Exam Vitals and nursing note reviewed.  Constitutional:      General: She is not in acute distress.    Appearance: She is well-developed. She is not diaphoretic.  HENT:     Head: Normocephalic and atraumatic.  Cardiovascular:     Rate and Rhythm: Normal rate and regular rhythm.     Heart sounds: Normal heart sounds.  Pulmonary:     Effort: Pulmonary effort is normal.     Breath sounds: Normal breath  sounds.  Skin:    General: Skin is warm and dry.     Findings: No erythema or rash.  Neurological:     Mental Status: She is alert and oriented to person, place, and time.  Psychiatric:        Behavior: Behavior normal.     ED Results / Procedures / Treatments   Labs (all labs ordered are listed, but only abnormal results are displayed) Labs Reviewed  COMPREHENSIVE METABOLIC PANEL - Abnormal; Notable for the following components:      Result Value   CO2 21 (*)    Total Bilirubin 0.2 (*)    All other components within normal limits  ETHANOL - Abnormal; Notable for the following components:   Alcohol, Ethyl (B) 63 (*)    All other components within normal limits   SALICYLATE LEVEL - Abnormal; Notable for the following components:   Salicylate Lvl Q000111Q (*)    All other components within normal limits  ACETAMINOPHEN LEVEL - Abnormal; Notable for the following components:   Acetaminophen (Tylenol), Serum <10 (*)    All other components within normal limits  CBC  RAPID URINE DRUG SCREEN, HOSP PERFORMED  I-STAT BETA HCG BLOOD, ED ([facility], [facility], [facility] ONLY)    EKG None  Radiology No results found.  Procedures Procedures    Medications Ordered in ED Medications  acetaminophen (TYLENOL) tablet 650 mg (has no administration in time range)  ondansetron (ZOFRAN) tablet 4 mg (has no administration in time range)  alum & mag hydroxide-simeth (MAALOX/MYLANTA) 200-200-20 MG/5ML suspension 30 mL (has no administration in time range)    ED Course/ Medical Decision Making/ A&P                             Medical Decision Making Amount and/or Complexity of Data Reviewed Labs: ordered.  Risk OTC drugs. Prescription drug management.   This patient presents to the ED for concern of SI, this involves an extensive number of treatment options, and is a complaint that carries with it a high risk of complications and morbidity.  The differential diagnosis includes but not limited to SI, psychosis, depression    Co morbidities that complicate the patient evaluation  GAD, ADHD, asthma, bipolar disorder   Additional history obtained:  External records from outside source obtained and reviewed including 10/2021 note for non viable pregnancy managed with misoprostol   Lab Tests:  I Ordered, and personally interpreted labs.  The pertinent results include:  alcohol elevated at 63, hcg negative. Apap, salicylate levels negative. CMP without significant findings. UDS negative. CBC WNL.    Cardiac Monitoring: / EKG:  The patient was maintained on a cardiac monitor.  I personally viewed and interpreted the cardiac monitored which showed an underlying rhythm  of: sinus tach rate 115   Consultations Obtained:  Medically cleared for BH eval and dispo I requested consultation with the [facility] team,  and discussed lab and imaging findings as well as pertinent plan - they recommend: Evaluation pending at change of shift.   Problem List / ED Course / Critical interventions / Medication management  22 year old female presents with SI with plan to overdose on medications. No other complaints, denies taking any medications to cause harm today. Labs reassuring, medically cleared for BH eval and dispo.  I have reviewed the patients home medicines and have made adjustments as needed   Social Determinants of Health:  Has PCP   Test /  Admission - Considered:  Disposition pending at change of shift.         Final Clinical Impression(s) / ED Diagnoses Final diagnoses:  Suicidal ideation    Rx / DC Orders ED Discharge Orders     None         Roque Lias 03/07/22 Samson Frederic, April, MD 03/07/22 [phone]

## 2022-03-07 NOTE — ED Triage Notes (Addendum)
Pt c/o SI x 1 day, pt reports hx of bipolar disorder, reports she had a miscarriage in Oct at 59 weeks, has felt loss since this and just found out a friend is pregnant; pt further reports she had 6-7 shots tonight and a glass of wine

## 2022-03-07 NOTE — ED Notes (Signed)
Pt wanded by security at this time  ?
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
## 2022-03-07 NOTE — ED Notes (Signed)
Reviewed discharge instructions with patient. Follow-up care and medications reviewed. Patient  verbalized understanding. Patient A&Ox4, VSS, and ambulatory with steady gait upon discharge.  °

## 2022-04-17 ENCOUNTER — Emergency Department (HOSPITAL_COMMUNITY)
Admission: EM | Admit: 2022-04-17 | Discharge: 2022-04-17 | Discharge status: Absent without leave | Payer: Medicaid Other | Source: Home / Self Care

## 2022-04-17 ENCOUNTER — Inpatient Hospital Stay (HOSPITAL_COMMUNITY)
Admission: AD | Admit: 2022-04-17 | Discharge: 2022-04-17 | Disposition: A | Discharge status: Home / Self Care | Payer: Medicaid Other | Attending: Obstetrics & Gynecology | Admitting: Obstetrics & Gynecology

## 2022-04-17 ENCOUNTER — Encounter (HOSPITAL_COMMUNITY): Payer: Self-pay | Admitting: Obstetrics & Gynecology

## 2022-04-17 DIAGNOSIS — R1030 Lower abdominal pain, unspecified: Secondary | ICD-10-CM | POA: Diagnosis not present

## 2022-04-17 DIAGNOSIS — Z3202 Encounter for pregnancy test, result negative: Secondary | ICD-10-CM | POA: Insufficient documentation

## 2022-04-17 LAB — POCT PREGNANCY, URINE: Preg Test, Ur: NEGATIVE

## 2022-04-17 LAB — HCG, QUANTITATIVE, PREGNANCY: hCG, Beta Chain, Quant, S: 66 m[IU]/mL — ABNORMAL HIGH (ref <5)

## 2022-04-17 NOTE — MAU Provider Note (Signed)
Event Date/Time   First Provider Initiated Contact with Patient 04/17/22 [phone]      S Emily Golden is a 22 y.o. G3P0030 by LMP of March 15th patient who presents to MAU today with complaint of abdominal cramping since Saturday.  She states the pain is constant and located in lower abdominal area.  She reports it has no relieving or worsening factors.  She denies vaginal bleeding.  Patient reports she has had multiple positive home UPTs and an elevated hCG at her PCP: [facility] under care of Lance Bosch.   O BP 135/79 (BP Location: Right Arm)   Pulse 71   Temp 98.7 F (37.1 C) (Oral)   Resp 16   Ht 5' 7.5" (1.715 m)   Wt 131.6 kg   SpO2 99%   BMI 44.77 kg/m  Physical Exam Constitutional:      Appearance: She is well-developed. She is obese.  HENT:     Head: Normocephalic and atraumatic.  Eyes:     Conjunctiva/sclera: Conjunctivae normal.  Pulmonary:     Effort: Pulmonary effort is normal. No respiratory distress.  Musculoskeletal:        General: Normal range of motion.  Neurological:     Mental Status: She is alert and oriented to person, place, and time.  Psychiatric:        Mood and Affect: Mood normal.        Behavior: Behavior normal.     A Medical screening exam complete Negative UPT  P  Patient informed of negative UPT today. Reports frustrations with multiple positive home UPTs and elevated hCG at MDs office Informed that provider can not speak to elevated hCG level at MD office, but home UPTs may detect at lower level than hospital ones. Patient given option to wait while hCG processing and after some consideration desires to go home. Phone number confirmed and patient endourses usage of mychart.  hCG placed Discharge to home with understanding that further evaluation may be necessary.  Discharge from MAU in stable condition  Gerrit Heck, [place] 04/17/2022 6:09 AM

## 2022-04-17 NOTE — MAU Note (Signed)
..  Emily Golden is a 22 y.o. at Unknown here in MAU reporting: abdominal cramping that began earlier Saturday. Has not taken medication for the pain. Pt reports that she has had miscarriages before and is concerned.  Gets care at [facility] and had a positive blood test on Thursday and was told she was [redacted] weeks pregnant.  Denies vaginal bleeding.  LMP: 03/18/2022  Pain score: 6/10 Vitals:   04/17/22 0600  BP: 135/79  Pulse: 71  Resp: 16  Temp: 98.7 F (37.1 C)  SpO2: 99%      Lab orders placed from triage:  pregnancy test

## 2022-04-19 ENCOUNTER — Inpatient Hospital Stay (HOSPITAL_COMMUNITY): Payer: Medicaid Other

## 2022-04-19 ENCOUNTER — Encounter (HOSPITAL_COMMUNITY): Payer: Self-pay | Admitting: Obstetrics and Gynecology

## 2022-04-19 ENCOUNTER — Telehealth (INDEPENDENT_AMBULATORY_CARE_PROVIDER_SITE_OTHER): Payer: Medicaid Other

## 2022-04-19 ENCOUNTER — Inpatient Hospital Stay (HOSPITAL_COMMUNITY)
Admission: AD | Admit: 2022-04-19 | Discharge: 2022-04-19 | Disposition: A | Discharge status: Home / Self Care | Payer: Medicaid Other | Attending: Obstetrics and Gynecology | Admitting: Obstetrics and Gynecology

## 2022-04-19 DIAGNOSIS — F411 Generalized anxiety disorder: Secondary | ICD-10-CM

## 2022-04-19 DIAGNOSIS — O26891 Other specified pregnancy related conditions, first trimester: Secondary | ICD-10-CM | POA: Diagnosis present

## 2022-04-19 DIAGNOSIS — O3680X1 Pregnancy with inconclusive fetal viability, fetus 1: Secondary | ICD-10-CM

## 2022-04-19 DIAGNOSIS — Z3A01 Less than 8 weeks gestation of pregnancy: Secondary | ICD-10-CM

## 2022-04-19 DIAGNOSIS — F3181 Bipolar II disorder: Secondary | ICD-10-CM

## 2022-04-19 DIAGNOSIS — R109 Unspecified abdominal pain: Secondary | ICD-10-CM | POA: Insufficient documentation

## 2022-04-19 DIAGNOSIS — O26899 Other specified pregnancy related conditions, unspecified trimester: Secondary | ICD-10-CM

## 2022-04-19 DIAGNOSIS — O3680X0 Pregnancy with inconclusive fetal viability, not applicable or unspecified: Secondary | ICD-10-CM

## 2022-04-19 LAB — COMPREHENSIVE METABOLIC PANEL
ALT: 20 U/L (ref 0–44)
AST: 21 U/L (ref 15–41)
Albumin: 4 g/dL (ref 3.5–5.0)
Alkaline Phosphatase: 73 U/L (ref 38–126)
Anion gap: 12 (ref 5–15)
BUN: 6 mg/dL (ref 6–20)
CO2: 19 mmol/L — ABNORMAL LOW (ref 22–32)
Calcium: 9.1 mg/dL (ref 8.9–10.3)
Chloride: 104 mmol/L (ref 98–111)
Creatinine, Ser: 0.6 mg/dL (ref 0.44–1.00)
GFR, Estimated: >60 mL/min (ref >60)
Glucose, Bld: 79 mg/dL (ref 70–99)
Potassium: 3.3 mmol/L — ABNORMAL LOW (ref 3.5–5.1)
Sodium: 135 mmol/L (ref 135–145)
Total Bilirubin: 0.8 mg/dL (ref 0.3–1.2)
Total Protein: 7.5 g/dL (ref 6.5–8.1)

## 2022-04-19 LAB — URINALYSIS, ROUTINE W REFLEX MICROSCOPIC
Bilirubin Urine: NEGATIVE
Glucose, UA: NEGATIVE mg/dL
Hgb urine dipstick: NEGATIVE
Ketones, ur: 5 mg/dL — AB
Leukocytes,Ua: NEGATIVE
Nitrite: NEGATIVE
Protein, ur: NEGATIVE mg/dL
Specific Gravity, Urine: 1.005 (ref 1.005–1.030)
pH: 6 (ref 5.0–8.0)

## 2022-04-19 LAB — CBC
HCT: 39.7 % (ref 36.0–46.0)
Hemoglobin: 13 g/dL (ref 12.0–15.0)
MCH: 27.1 pg (ref 26.0–34.0)
MCHC: 32.7 g/dL (ref 30.0–36.0)
MCV: 82.7 fL (ref 80.0–100.0)
Platelets: 254 10*3/uL (ref 150–400)
RBC: 4.8 MIL/uL (ref 3.87–5.11)
RDW: 13.2 % (ref 11.5–15.5)
WBC: 10 10*3/uL (ref 4.0–10.5)
nRBC: 0 % (ref 0.0–0.2)

## 2022-04-19 LAB — HCG, QUANTITATIVE, PREGNANCY: hCG, Beta Chain, Quant, S: 155 m[IU]/mL — ABNORMAL HIGH (ref <5)

## 2022-04-19 NOTE — MAU Note (Addendum)
...  Emily Golden is a 22 y.o. at [redacted]w[redacted]d here in MAU reporting: Intermittent pelvic cramps since this past Saturday. She reports she was evaluated in MAU and her UPT was negative but she was called today and told that her hCG was positive. hCG results 66 from 4/14. Endorses occasional N/V. Had one episode of emesis this morning. Denies VB.  LMP: 03/18/2022 Onset of complaint: This past Saturday Pain score: 3/10 pelvis  Lab orders placed from triage:  UA

## 2022-04-19 NOTE — MAU Provider Note (Signed)
History     CSN: [id number]  Arrival date and time: 04/19/22 1506   Chief Complaint  Patient presents with   Abdominal Pain   Nausea   Emesis   HPI Patient is 22 y.o. [id number] [redacted]w[redacted]d here with complaints of abdominal pain and nausea/vomiting. Not vomiting currently. She is worried about her pregnancy with the cramping. She reports lots of anxiety.   denies LOF, VB, contractions, vaginal discharge.  [place] [address] with Rondall Allegra at [facility], Therapy.  OB History     Gravida  4   Para      Term      Preterm      AB  3   Living         SAB  3   IAB      Ectopic      Multiple      Live Births              Past Medical History:  Diagnosis Date   Allergy    Anxiety    Asthma    Bipolar 1 disorder     Past Surgical History:  Procedure Laterality Date   NASAL RECONSTRUCTION      Family History  Problem Relation Age of Onset   Diabetes Maternal Grandmother    Hypertension Maternal Grandmother    Hyperlipidemia Maternal Grandmother    Heart disease Maternal Grandfather    Cancer Maternal Grandfather    Diabetes Maternal Grandfather    Heart failure Neg Hx     Social History   Tobacco Use   Smoking status: Former    Types: Cigarettes    Quit date: 04/09/2018    Years since quitting: 4.0   Smokeless tobacco: Never   Tobacco comments:    Says no smoking for 2 months  Vaping Use   Vaping Use: Former  Substance Use Topics   Alcohol use: Yes    Alcohol/week: 6.0 standard drinks of alcohol    Types: 6 Glasses of wine per week    Comment: occ   Drug use: Not Currently    Types: Marijuana    Comment: cannabis use, last use 6 months ago    Allergies:  Allergies  Allergen Reactions   Other Shortness Of Breath    Dust, Reports smells,perfumes,aerosols HX asthma    Latex Rash    No medications prior to admission.    Review of Systems  Constitutional:  Negative for chills and fever.  HENT:  Negative for congestion  and sore throat.   Eyes:  Negative for pain and visual disturbance.  Respiratory:  Negative for cough, chest tightness and shortness of breath.   Cardiovascular:  Negative for chest pain.  Gastrointestinal:  Negative for abdominal pain, diarrhea, nausea and vomiting.  Endocrine: Negative for cold intolerance and heat intolerance.  Genitourinary:  Negative for dysuria and flank pain.  Musculoskeletal:  Negative for back pain.  Skin:  Negative for rash.  Allergic/Immunologic: Negative for food allergies.  Neurological:  Negative for dizziness and light-headedness.  Psychiatric/Behavioral:  Negative for agitation.    Physical Exam   Blood pressure 126/81, pulse (!) 101, temperature 98.9 F (37.2 C), temperature source Oral, resp. rate 15, height 5' 7.5" (1.715 m), weight 131.6 kg, last menstrual period 03/18/2022, SpO2 100 %, unknown if currently breastfeeding.  Physical Exam Vitals and nursing note reviewed.  Constitutional:      General: She is not in acute distress.    Appearance: She is well-developed.  HENT:     Head: Normocephalic and atraumatic.  Eyes:     General: No scleral icterus.    Conjunctiva/sclera: Conjunctivae normal.  Cardiovascular:     Rate and Rhythm: Normal rate.  Pulmonary:     Effort: Pulmonary effort is normal.  Chest:     Chest wall: No tenderness.  Abdominal:     Palpations: Abdomen is soft.     Tenderness: There is no abdominal tenderness. There is no guarding or rebound.  Genitourinary:    Vagina: Normal.  Musculoskeletal:        General: Normal range of motion.     Cervical back: Normal range of motion and neck supple.  Skin:    General: Skin is warm and dry.     Findings: No rash.  Neurological:     Mental Status: She is alert and oriented to person, place, and time.     MAU Course  Procedures   MDM: moderate  This patient presents to the ED for concern of   Chief Complaint  Patient presents with   Abdominal Pain   Nausea    Emesis     These complains involves an extensive number of treatment options, and is a complaint that carries with it a high risk of complications and morbidity.  The differential diagnosis for  1. Abdominal pain in pregnancy INCLUDES ectopic, miscarriage, normal variant  Co morbidities that complicate the patient evaluation: Patient Active Problem List   Diagnosis Date Noted   Grief 03/07/2022   Miscarriage 09/21/2021   Bipolar 2 disorder 07/02/2021   GAD (generalized anxiety disorder) 07/02/2021   ADHD 07/02/2021   Tobacco use disorder 07/02/2021   Alcohol consumption binge drinking 07/02/2021   Suicide attempt by drug ingestion 06/09/2018   Ingestion of unknown substance 06/08/2018   PTSD (post-traumatic stress disorder)    Syncope 06/24/2015   Insulin resistance 06/24/2015   Morbid childhood obesity with BMI greater than 99th percentile for age 68/21/2017    External records from outside source obtained and reviewed including CareEverywhere and Prenatal care records  Lab Tests: UA, CMP, CBC, and BHCG  I ordered, and personally interpreted labs.  The pertinent results include:  WNL. bHCG appropriatley rising.   Imaging Studies ordered:  I ordered imaging studies includingTransvaginal [place] I independently visualized and interpreted imaging which showed early gestational sac I agree with the radiologist interpretation Medicines ordered and prescription drug management:  Medications:    Reevaluation of the patient after these medicines showed that the patient improved I have reviewed the patients home medicines and have made adjustments as needed   MAU Course: Updated patient with labs and also images that is consistent with very early pregnancy. Offered to scheduled OP [place] at [facility] and patient accepts.   After the interventions noted above, I reevaluated the patient and found that they have :improved  Dispostion: discharged    Assessment and Plan   1. Pregnancy with  uncertain fetal viability, single or unspecified fetus   2. Cramping affecting pregnancy, antepartum   3. Bipolar 2 disorder   4. GAD (generalized anxiety disorder)   5. [redacted] weeks gestation of pregnancy   - Return precautions given - Needs repeat US in 10-14d- scheduled for patient - Establish prenatal care - Encouraged engagement with psych providers for anxiey.   Future Appointments  Date Time Provider Department Center  05/03/2022  8:00 AM [facility] US2 [facility]  05/19/2022  3:15 PM [facility] INTAKE [facility]  05/25/2022  2:55  PM Constant, Peggy, MD [facility]    Allergies as of 04/19/2022       Reactions   Other Shortness Of Breath   Dust, Reports smells,perfumes,aerosols HX asthma   Latex Rash        Medication List     STOP taking these medications    cariprazine 1.5 MG capsule Commonly known as: VRAYLAR   lidocaine 5 % Commonly known as: Lidoderm   nicotine 21 mg/24hr patch Commonly known as: NICODERM CQ - dosed in mg/24 hours       TAKE these medications    beclomethasone 80 MCG/ACT inhaler Commonly known as: QVAR Inhale 2 puffs into the lungs daily as needed (for shortenss of breath).   hydrOXYzine 25 MG capsule Commonly known as: VISTARIL Take 75 mg by mouth in the morning and at bedtime.        Isa Rankin [facility] 04/26/2022, 9:10 PM

## 2022-04-19 NOTE — Telephone Encounter (Signed)
Emily Golden 11-Feb-2000 [id number]   Patient called and verified her identity via birth date.  Patient agreeable to results via phone and was informed of hCG results from 4/14.  She states previous hCG level was a 10.  Provider discussed need for repeat labs and [place] today when available and patient agreeable to come in after 1730.  Patient states that she has continued to have some cramping, but no vaginal bleeding.  She also reports some nausea.  Instructed to notify staff, upon arrival, of nausea for possible PO treatment.  Further informed that [place] and additional lab work (CBC/CMP) will also be collected so anticipate extended wait time. Patient verbalizes understanding and has no questions, but expresses concern.  She reports some distress with her treatment from recent MAU visit.  She states she felt nursing staff was "rolling their eyes" and not believing her.  She further states that after she was given her negative results, nursing staff lacked empathy.  Patient states she does not recall what staff looked like, and goes on to state that she had no issues with provider, but with those prior to provider arrival.  She recalls that this was the reason she was crying when provider came to bedside. Apologies given and provider offered to have management and/or patient engagement contact patient, but she declines.  Encouraged to inform staff or other personal of concerns when they arise for immediate documentation and appropriate steps to resolution.  Patient expresses appreciation and understanding. No further questions or concerns. Message sent to MAU staff notifying them of patient anticipated arrival.   Cherre Robins MSN, CNM Advanced Practice Provider, Center for [facility]  **This visit was completed, in its entirety, via telehealth communications.  I personally spent >/=5 minutes on the phone providing recommendations, education, and guidance.**

## 2022-04-19 NOTE — Discharge Instructions (Addendum)
[facility]: this group can help manage you medications for your bipolar disorder  [email or web address]    Safe Medications in Pregnancy   Acne: Benzoyl Peroxide Salicylic Acid  Backache/Headache: Tylenol: 2 regular strength every 4 hours OR              2 Extra strength every 6 hours  Colds/Coughs/Allergies: Benadryl (alcohol free) 25 mg every 6 hours as needed Breath right strips Claritin Cepacol throat lozenges Chloraseptic throat spray Cold-Eeze- up to three times per day Cough drops, alcohol free Flonase (by prescription only) Guaifenesin Mucinex Robitussin DM (plain only, alcohol free) Saline nasal spray/drops Sudafed (pseudoephedrine) & Actifed ** use only after [redacted] weeks gestation and if you do not have high blood pressure Tylenol Vicks Vaporub Zinc lozenges Zyrtec   Constipation: Colace Ducolax suppositories Fleet enema Glycerin suppositories Metamucil Milk of magnesia Miralax Senokot Smooth move tea  Diarrhea: Kaopectate Imodium A-D  *NO pepto Bismol  Hemorrhoids: Anusol Anusol HC Preparation H Tucks  Indigestion: Tums Maalox Mylanta Zantac  Pepcid  Insomnia: Benadryl (alcohol free)  every 6 hours as needed Tylenol PM Unisom, no Gelcaps  Leg Cramps: Tums MagGel  Nausea/Vomiting:  Bonine Dramamine Emetrol Ginger extract Sea bands Meclizine  Nausea medication to take during pregnancy:  Unisom (doxylamine succinate 25 mg tablets) Take one tablet daily at bedtime. If symptoms are not adequately controlled, the dose can be increased to a maximum recommended dose of two tablets daily (1/2 tablet in the morning, 1/2 tablet mid-afternoon and one at bedtime). Vitamin B6  tablets. Take one tablet twice a day (up to 200 mg per day).  Skin Rashes: Aveeno products Benadryl cream or  every 6 hours as needed Calamine Lotion 1% cortisone cream  Yeast infection: Gyne-lotrimin 7 Monistat 7   **If taking  multiple medications, please check labels to avoid duplicating the same active ingredients **take medication as directed on the label ** Do not exceed 4000 mg of tylenol in 24 hours **Do not take medications that contain aspirin or ibuprofen

## 2022-05-01 ENCOUNTER — Encounter (HOSPITAL_COMMUNITY): Payer: Self-pay | Admitting: Obstetrics & Gynecology

## 2022-05-01 ENCOUNTER — Telehealth: Payer: Medicaid Other | Admitting: Family Medicine

## 2022-05-01 ENCOUNTER — Telehealth: Payer: Medicaid Other | Admitting: Urgent Care

## 2022-05-01 ENCOUNTER — Inpatient Hospital Stay (HOSPITAL_COMMUNITY)
Admission: AD | Admit: 2022-05-01 | Discharge: 2022-05-01 | Disposition: A | Discharge status: Home / Self Care | Payer: Medicaid Other | Attending: Obstetrics & Gynecology | Admitting: Obstetrics & Gynecology

## 2022-05-01 ENCOUNTER — Other Ambulatory Visit: Payer: Self-pay

## 2022-05-01 ENCOUNTER — Other Ambulatory Visit (HOSPITAL_BASED_OUTPATIENT_CLINIC_OR_DEPARTMENT_OTHER): Payer: Self-pay | Admitting: Advanced Practice Midwife

## 2022-05-01 DIAGNOSIS — Z6841 Body Mass Index (BMI) 40.0 and over, adult: Secondary | ICD-10-CM | POA: Diagnosis not present

## 2022-05-01 DIAGNOSIS — Z3A01 Less than 8 weeks gestation of pregnancy: Secondary | ICD-10-CM

## 2022-05-01 DIAGNOSIS — M7989 Other specified soft tissue disorders: Secondary | ICD-10-CM | POA: Diagnosis not present

## 2022-05-01 DIAGNOSIS — M79662 Pain in left lower leg: Secondary | ICD-10-CM

## 2022-05-01 DIAGNOSIS — O26891 Other specified pregnancy related conditions, first trimester: Secondary | ICD-10-CM | POA: Insufficient documentation

## 2022-05-01 DIAGNOSIS — R600 Localized edema: Secondary | ICD-10-CM

## 2022-05-01 MED ORDER — ENOXAPARIN SODIUM 150 MG/ML IJ SOSY
140.0000 mg | PREFILLED_SYRINGE | Freq: Once | INTRAMUSCULAR | Status: AC
  Administered 2022-05-01: 140 mg via SUBCUTANEOUS
  Filled 2022-05-01: qty 0.94

## 2022-05-01 NOTE — Progress Notes (Deleted)
Virtual Visit Consent   Emily Golden, you are scheduled for a virtual visit with a [facility] provider today. Just as with appointments in the office, your consent must be obtained to participate. Your consent will be active for this visit and any virtual visit you may have with one of our providers in the next 365 days. If you have a MyChart account, a copy of this consent can be sent to you electronically.  As this is a virtual visit, video technology does not allow for your provider to perform a traditional examination. This may limit your provider's ability to fully assess your condition. If your provider identifies any concerns that need to be evaluated in person or the need to arrange testing (such as labs, EKG, etc.), we will make arrangements to do so. Although advances in technology are sophisticated, we cannot ensure that it will always work on either your end or our end. If the connection with a video visit is poor, the visit may have to be switched to a telephone visit. With either a video or telephone visit, we are not always able to ensure that we have a secure connection.  By engaging in this virtual visit, you consent to the provision of healthcare and authorize for your insurance to be billed (if applicable) for the services provided during this visit. Depending on your insurance coverage, you may receive a charge related to this service.  I need to obtain your verbal consent now. Are you willing to proceed with your visit today? Emily Golden has provided verbal consent on 05/01/2022 for a virtual visit (video or telephone). Reed Pandy, [place]  Date: 05/01/2022 4:24 PM  Virtual Visit via Video Note   IReed Pandy, connected with  Emily Golden  ([id number], [birth year]) on 05/01/22 at  4:15 PM EDT by a video-enabled telemedicine application and verified that I am speaking with the correct person using two identifiers.  Location: Patient: {Virtual Visit Location  Patient:[id number]::"Home"} Provider: {Virtual Visit Location Provider:[id number]::"Office/Clinic"}   I discussed the limitations of evaluation and management by telemedicine and the availability of in person appointments. The patient expressed understanding and agreed to proceed.    History of Present Illness: Emily Golden is a 22 y.o. who identifies as a female who was assigned female at birth, and is being seen today for Pt states ankle and feet are very swollen for the last 3 days and she is [redacted] weeks pregnant. Pt denies pain but states she has pressure from the swelling.  Pt states both feet and ankle are swollen but left foot and ankle are more swollen than the right. Pt states she has some pain in both calves.  She states the pain is like a soreness from when you go to the gym. Pt denies fever, chills or difficulty breathing.   HPI: HPI  Problems:  Patient Active Problem List   Diagnosis Date Noted   Grief 03/07/2022   Miscarriage 09/21/2021   Bipolar 2 disorder (HCC) 07/02/2021   GAD (generalized anxiety disorder) 07/02/2021   ADHD 07/02/2021   Tobacco use disorder 07/02/2021   Alcohol consumption binge drinking 07/02/2021   Suicide attempt by drug ingestion (HCC) 06/09/2018   Ingestion of unknown substance 06/08/2018   PTSD (post-traumatic stress disorder)    Syncope 06/24/2015   Insulin resistance 06/24/2015   Morbid childhood obesity with BMI greater than 99th percentile for age [facility]) 06/24/2015    Allergies:  Allergies  Allergen Reactions   Other Shortness Of Breath  Dust, Reports smells,perfumes,aerosols HX asthma    Latex Rash   Medications:  Current Outpatient Medications:    beclomethasone (QVAR) 80 MCG/ACT inhaler, Inhale 2 puffs into the lungs daily as needed (for shortenss of breath)., Disp: , Rfl:    hydrOXYzine (VISTARIL) 25 MG capsule, Take 75 mg by mouth in the morning and at bedtime., Disp: , Rfl:   Observations/Objective: Patient is well-developed,  well-nourished in no acute distress.  Resting comfortably at home.  Head is normocephalic, atraumatic.  No labored breathing. Speech is clear and coherent with logical content.  Patient is alert and oriented at baseline.  Patient has bilateral lower ankle and foot edea.  L>R  Assessment and Plan: 1. Localized edema  -Given the edema is causing pain and calf tightness, I advised Pt to head to the emergency room or urgent care for further evaluation.  Patient verbalized understanding.   Follow Up Instructions: I discussed the assessment and treatment plan with the patient. The patient was provided an opportunity to ask questions and all were answered. The patient agreed with the plan and demonstrated an understanding of the instructions.  A copy of instructions were sent to the patient via MyChart unless otherwise noted below.     The patient was advised to call back or seek an in-person evaluation if the symptoms worsen or if the condition fails to improve as anticipated.  Time:  I spent 10 minutes with the patient via telehealth technology discussing the above problems/concerns.    Reed Pandy, PA-C

## 2022-05-01 NOTE — Progress Notes (Signed)
[id number] at [redacted]w[redacted]d presented to MAU on 05/01/22 with left calf pain and bilateral lower extremity swelling. Lovenox SQ was given per protocol and pt to return to [facility] on 05/02/22 at 8:00 am for venous doppler imaging.

## 2022-05-01 NOTE — Progress Notes (Signed)
Virtual Visit Consent   Emily Golden, you are scheduled for a virtual visit with a [facility] provider today. Just as with appointments in the office, your consent must be obtained to participate. Your consent will be active for this visit and any virtual visit you may have with one of our providers in the next 365 days. If you have a MyChart account, a copy of this consent can be sent to you electronically.  As this is a virtual visit, video technology does not allow for your provider to perform a traditional examination. This may limit your provider's ability to fully assess your condition. If your provider identifies any concerns that need to be evaluated in person or the need to arrange testing (such as labs, EKG, etc.), we will make arrangements to do so. Although advances in technology are sophisticated, we cannot ensure that it will always work on either your end or our end. If the connection with a video visit is poor, the visit may have to be switched to a telephone visit. With either a video or telephone visit, we are not always able to ensure that we have a secure connection.  By engaging in this virtual visit, you consent to the provision of healthcare and authorize for your insurance to be billed (if applicable) for the services provided during this visit. Depending on your insurance coverage, you may receive a charge related to this service.  I need to obtain your verbal consent now. Are you willing to proceed with your visit today? Emily Golden has provided verbal consent on 05/01/2022 for a virtual visit (video or telephone). Reed Pandy, [place]  Date: 05/01/2022 4:32 PM  Virtual Visit via Video Note   IReed Pandy, connected with  Emily Golden  ([id number], [birth year]) on 05/01/22 at  4:15 PM EDT by a video-enabled telemedicine application and verified that I am speaking with the correct person using two identifiers.  Location: Patient: Virtual Visit Location Patient: Home Provider:  Virtual Visit Location Provider: Home Office   I discussed the limitations of evaluation and management by telemedicine and the availability of in person appointments. The patient expressed understanding and agreed to proceed.    History of Present Illness: Emily Golden is a 22 y.o. who identifies as a female who was assigned female at birth, and is being seen today for Pt states ankle and feet are very swollen for the last 3 days and she is [redacted] weeks pregnant. Pt denies pain but states she has pressure from the swelling.  Pt states both feet and ankle are swollen but left foot and ankle are more swollen than the right. Pt states she has some pain in both calves.  She states the pain is like a soreness from when you go to the gym. Pt denies fever, chills or difficulty breathing.   HPI: HPI  Problems:  Patient Active Problem List   Diagnosis Date Noted   Grief 03/07/2022   Miscarriage 09/21/2021   Bipolar 2 disorder (HCC) 07/02/2021   GAD (generalized anxiety disorder) 07/02/2021   ADHD 07/02/2021   Tobacco use disorder 07/02/2021   Alcohol consumption binge drinking 07/02/2021   Suicide attempt by drug ingestion (HCC) 06/09/2018   Ingestion of unknown substance 06/08/2018   PTSD (post-traumatic stress disorder)    Syncope 06/24/2015   Insulin resistance 06/24/2015   Morbid childhood obesity with BMI greater than 99th percentile for age [facility]) 06/24/2015    Allergies:  Allergies  Allergen Reactions   Other Shortness Of  Breath    Dust, Reports smells,perfumes,aerosols HX asthma    Latex Rash   Medications:  Current Outpatient Medications:    beclomethasone (QVAR) 80 MCG/ACT inhaler, Inhale 2 puffs into the lungs daily as needed (for shortenss of breath)., Disp: , Rfl:    hydrOXYzine (VISTARIL) 25 MG capsule, Take 75 mg by mouth in the morning and at bedtime., Disp: , Rfl:   Observations/Objective: Patient is well-developed, well-nourished in no acute distress.  Resting comfortably  at home.  Head is normocephalic, atraumatic.  No labored breathing.  Speech is clear and coherent with logical content.  Patient is alert and oriented at baseline.  Patient has bilateral lower ankle and foot edema.  L>R  Assessment and Plan: 1. Localized edema  -Given the edema is causing pain and calf tightness, I advised Pt to head to the emergency room or urgent care for further evaluation.  Patient verbalized understanding.   Follow Up Instructions: I discussed the assessment and treatment plan with the patient. The patient was provided an opportunity to ask questions and all were answered. The patient agreed with the plan and demonstrated an understanding of the instructions.  A copy of instructions were sent to the patient via MyChart unless otherwise noted below.     The patient was advised to call back or seek an in-person evaluation if the symptoms worsen or if the condition fails to improve as anticipated.  Time:  I spent 10 minutes with the patient via telehealth technology discussing the above problems/concerns.    Reed Pandy, PA-C

## 2022-05-01 NOTE — MAU Note (Signed)
.  Emily Golden is a 22 y.o. at [redacted]w[redacted]d here in MAU reporting: bilateral swelling of ankles and feet and tightness of left calf muscle.  Denies abd pain or vag bleeding   Onset of complaint: 3days Pain score:  Vitals:   05/01/22 1800 05/01/22 1802  BP:  (!) 151/82  Pulse:  (!) 102  Resp:  16  Temp:  98.3 F (36.8 C)  SpO2: 100%

## 2022-05-01 NOTE — Patient Instructions (Signed)
Emily Golden, thank you for joining Reed Pandy, PA-C for today's virtual visit.  While this provider is not your primary care provider (PCP), if your PCP is located in our provider database this encounter information will be shared with them immediately following your visit.   A [facility] MyChart account gives you access to today's visit and all your visits, tests, and labs performed at [facility] " click here if you don't have a [facility] MyChart account or go to [email or web address]  Consent: (Patient) Emily Golden provided verbal consent for this virtual visit at the beginning of the encounter.  Current Medications:  Current Outpatient Medications:    beclomethasone (QVAR) 80 MCG/ACT inhaler, Inhale 2 puffs into the lungs daily as needed (for shortenss of breath)., Disp: , Rfl:    hydrOXYzine (VISTARIL) 25 MG capsule, Take 75 mg by mouth in the morning and at bedtime., Disp: , Rfl:    Medications ordered in this encounter:  No orders of the defined types were placed in this encounter.    *If you need refills on other medications prior to your next appointment, please contact your pharmacy*  Follow-Up: Call back or seek an in-person evaluation if the symptoms worsen or if the condition fails to improve as anticipated.  [facility] [phone]  Other Instructions Edema is when you have too much fluid in your body or under your skin. Edema may make your legs, feet, and ankles swell. Swelling often happens in looser tissues, such as around your eyes. This is a common condition. It gets more common as you get older. There are many possible causes of edema. These include: Eating too much salt (sodium). Being on your feet or sitting for a long time. Certain medical conditions, such as: Pregnancy. Heart failure. Liver disease. Kidney disease. Cancer. Hot weather may make edema worse. Edema is usually painless. Your skin may look swollen or  shiny. Follow these instructions at home: Medicines Take over-the-counter and prescription medicines only as told by your doctor. Your doctor may prescribe a medicine to help your body get rid of extra water (diuretic). Take this medicine if you are told to take it. Eating and drinking Eat a low-salt (low-sodium) diet as told by your doctor. Sometimes, eating less salt may reduce swelling. Depending on the cause of your swelling, you may need to limit how much fluid you drink (fluid restriction). General instructions Raise the injured area above the level of your heart while you are sitting or lying down. Do not sit still or stand for a long time. Do not wear tight clothes. Do not wear garters on your upper legs. Exercise your legs. This can help the swelling go down. Wear compression stockings as told by your doctor. It is important that these are the right size. These should be prescribed by your doctor to prevent possible injuries. If elastic bandages or wraps are recommended, use them as told by your doctor. Contact a doctor if: Treatment is not working. You have heart, liver, or kidney disease and have symptoms of edema. You have sudden and unexplained weight gain. Get help right away if: You have shortness of breath or chest pain. You cannot breathe when you lie down. You have pain, redness, or warmth in the swollen areas. You have heart, liver, or kidney disease and get edema all of a sudden. You have a fever and your symptoms get worse all of a sudden. These symptoms may be an emergency. Get help right away.  Call 911. Do not wait to see if the symptoms will go away. Do not drive yourself to the hospital. Summary Edema is when you have too much fluid in your body or under your skin. Edema may make your legs, feet, and ankles swell. Swelling often happens in looser tissues, such as around your eyes. Raise the injured area above the level of your heart while you are sitting or  lying down. Follow your doctor's instructions about diet and how much fluid you can drink. This information is not intended to replace advice given to you by your health care provider. Make sure you discuss any questions you have with your health care provider.  If you have been instructed to have an in-person evaluation today at a local Urgent Care facility, please use the link below. It will take you to a list of all of our available [facility], including address, phone number and hours of operation. Please do not delay care.  [facility]  If you or a family member do not have a primary care provider, use the link below to schedule a visit and establish care. When you choose a [facility] primary care physician or advanced practice provider, you gain a long-term partner in health. Find a Primary Care Provider  Learn more about [facility]'s in-office and virtual care options: [facility] - Get Care Now

## 2022-05-01 NOTE — Progress Notes (Signed)
The patient no-showed for appointment despite this provider sending direct link, reaching out via phone with no response and waiting for at least 10 minutes from appointment time for patient to join. They will be marked as a NS for this appointment/time.   Dwanda Tufano L Yaw Escoto, PA    

## 2022-05-01 NOTE — Patient Instructions (Signed)
The patient no-showed for appointment despite this provider sending direct link, reaching out via phone with no response and waiting for at least 10 minutes from appointment time for patient to join. They will be marked as a NS for this appointment/time.   Veona Bittman L Zeola Brys, PA    

## 2022-05-01 NOTE — Progress Notes (Signed)
ANTICOAGULATION CONSULT NOTE - Initial Consult  Pharmacy Consult for Lovenox dose x 1  Indication: r/o DVT  Allergies  Allergen Reactions   Other Shortness Of Breath    Dust, Reports smells,perfumes,aerosols HX asthma    Latex Rash    Patient Measurements: Height: 5\' 7"  (170.2 cm) Weight: 134.7 kg (296 lb 14.4 oz) IBW/kg (Calculated) : 61.6   Vital Signs: Temp: 98.3 F (36.8 C) (04/28 1802) Temp Source: Oral (04/28 1802) BP: 124/54 (04/28 2001) Pulse Rate: 92 (04/28 2001)  Labs: No results for input(s): "HGB", "HCT", "PLT", "APTT", "LABPROT", "INR", "HEPARINUNFRC", "HEPRLOWMOCWT", "CREATININE", "CKTOTAL", "CKMB", "TROPONINIHS" in the last 72 hours.  Estimated Creatinine Clearance: 160.8 mL/min (by C-G formula based on SCr of 0.6 mg/dL).   Medical History: Past Medical History:  Diagnosis Date   Allergy    Anxiety    Asthma    Bipolar 1 disorder (HCC)     Assessment: 22yo F pt at [redacted]w[redacted]d gestation presents to MAU with left calf pain and lower leg swelling. Lovenox 1mg /kg dose x 1 ordered with pt returning to vasular lab in am.  Goal of Therapy:  Anti-Xa level 0.6-1 units/ml 4hrs after LMWH dose given Monitor platelets by anticoagulation protocol: Yes   Plan:  Lovenox 140mg  sq x 1 in MAU  Claybon Jabs 05/01/2022,8:36 PM

## 2022-05-01 NOTE — MAU Provider Note (Signed)
Chief Complaint: Leg Swelling   Event Date/Time   First Provider Initiated Contact with Patient 05/01/22 1851      SUBJECTIVE HPI: Emily Golden is a 22 y.o. G4P0030 at [redacted]w[redacted]d by LMP who presents to maternity admissions reporting bilateral lower leg swelling and pain in her left calf that started today.  She denies any redness or warmth in either leg.  There are no other symptoms. She has an outpatient [place] for viability on 05/03/22.  She denies any abdominal pain or vaginal bleeding today. HPI  Past Medical History:  Diagnosis Date   Allergy    Anxiety    Asthma    Bipolar 1 disorder (HCC)    Past Surgical History:  Procedure Laterality Date   NASAL RECONSTRUCTION     Social History   Socioeconomic History   Marital status: Married    Spouse name: Not on file   Number of children: Not on file   Years of education: Not on file   Highest education level: Not on file  Occupational History   Not on file  Tobacco Use   Smoking status: Former    Types: Cigarettes    Quit date: 04/09/2018    Years since quitting: 4.0   Smokeless tobacco: Never   Tobacco comments:    Says no smoking for 2 months  Vaping Use   Vaping Use: Former  Substance and Sexual Activity   Alcohol use: Yes    Alcohol/week: 6.0 standard drinks of alcohol    Types: 6 Glasses of wine per week    Comment: occ   Drug use: Not Currently    Types: Marijuana    Comment: cannabis use, last use 6 months ago   Sexual activity: Yes    Birth control/protection: None  Other Topics Concern   Not on file  Social History Narrative   Is in 10th grade at [employer]   Social Determinants of Health   Financial Resource Strain: Not on file  Food Insecurity: No Food Insecurity (09/22/2021)   Hunger Vital Sign    Worried About Running Out of Food in the Last Year: Never true    Ran Out of Food in the Last Year: Never true  Transportation Needs: No Transportation Needs (09/22/2021)   PRAPARE -  Administrator, Civil Service (Medical): No    Lack of Transportation (Non-Medical): No  Physical Activity: Not on file  Stress: Not on file  Social Connections: Not on file  Intimate Partner Violence: Not on file   No current facility-administered medications on file prior to encounter.   Current Outpatient Medications on File Prior to Encounter  Medication Sig Dispense Refill   beclomethasone (QVAR) 80 MCG/ACT inhaler Inhale 2 puffs into the lungs daily as needed (for shortenss of breath).     hydrOXYzine (VISTARIL) 25 MG capsule Take 75 mg by mouth in the morning and at bedtime.     Allergies  Allergen Reactions   Other Shortness Of Breath    Dust, Reports smells,perfumes,aerosols HX asthma    Latex Rash    ROS:  Review of Systems  Constitutional:  Negative for chills, fatigue and fever.  Respiratory:  Negative for shortness of breath.   Cardiovascular:  Positive for leg swelling. Negative for chest pain.  Genitourinary:  Negative for difficulty urinating, dysuria, flank pain, pelvic pain, vaginal bleeding, vaginal discharge and vaginal pain.  Musculoskeletal:  Positive for myalgias.       Pain/tightness in left calf  Neurological:  Negative for dizziness and headaches.  Psychiatric/Behavioral: Negative.       I have reviewed patient's Past Medical Hx, Surgical Hx, Family Hx, Social Hx, medications and allergies.   Physical Exam  Patient Vitals for the past 24 hrs:  BP Temp Temp src Pulse Resp SpO2 Height Weight  05/01/22 2001 (!) 124/54 -- -- 92 -- -- -- --  05/01/22 1954 -- -- -- -- -- -- 5\' 7"  (1.702 m) 134.7 kg  05/01/22 1949 129/69 -- -- 92 -- -- -- --  05/01/22 1802 (!) 151/82 98.3 F (36.8 C) Oral (!) 102 16 -- -- --  05/01/22 1800 -- -- -- -- -- 100 % -- --   Constitutional: Well-developed, well-nourished female in no acute distress.  Cardiovascular: normal rate Respiratory: normal effort GI: Abd soft, non-tender. Pos BS x 4 MS: Extremities  nontender, 2+ nonpitting BLE edema,  negative Homans bilaterally, normal ROM Neurologic: Alert and oriented x 4.  GU: Neg CVAT.  PELVIC EXAM: Deferred  Calf measurements at largest diameter: Right calf 52 cm, Left calf 52 cm  LAB RESULTS No results found for this or any previous visit (from the past 24 hour(s)).  O/Positive/-- (09/27 0905)  IMAGING US OB LESS THAN 14 WEEKS WITH OB TRANSVAGINAL  Result Date: 04/19/2022 CLINICAL DATA:  Abdominal cramping. Estimated gestational age of [redacted] weeks, 4 days by LMP. EXAM: OBSTETRIC <14 WK [place] AND TRANSVAGINAL OB US TECHNIQUE: Both transabdominal and transvaginal ultrasound examinations were performed for complete evaluation of the gestation as well as the maternal uterus, adnexal regions, and pelvic cul-de-sac. Transvaginal technique was performed to assess early pregnancy. COMPARISON:  Pelvic ultrasound dated September 08, 2021. FINDINGS: Intrauterine gestational sac: Single. Yolk sac:  Not Visualized. Embryo:  Not Visualized. MSD: 2.3 mm   4 w   6 d Subchorionic hemorrhage:  None visualized. Maternal uterus/adnexae: Unremarkable. IMPRESSION: 1. Probable early intrauterine gestational sac, but no yolk sac, fetal pole, or cardiac activity yet visualized. Recommend follow-up quantitative B-HCG levels and follow-up US in 14 days to assess viability. This recommendation follows SRU consensus guidelines: Diagnostic Criteria for Nonviable Pregnancy Early in the First Trimester. Malva Limes Med 2013; 865:7846-96. Electronically Signed   By: Obie Dredge M.D.   On: 04/19/2022 16:50    MAU Management/MDM: Orders Placed This Encounter  Procedures   ENOXAPARIN (LOVENOX) PER PHARMACY CONSULT    Meds ordered this encounter  Medications   enoxaparin (LOVENOX) injection 140 mg    Pain in calf with risk factors including first trimester pregnancy, BMI 46, and recent smoker (now quit).  Low suspicion with symmetry between calf measurements, no warmth or erythema.  Negative Homans sign.  Per protocol, pt given Lovenox 1 mg /kg tonight and orders placed for venous doppler imaging tomorrow morning with [facility].  Warning signs/reasons to return to care reviewed.     ASSESSMENT 1. Pain of left calf   2. [redacted] weeks gestation of pregnancy   3. BMI 45.0-49.9, adult [facility])     PLAN Discharge home Allergies as of 05/01/2022       Reactions   Other Shortness Of Breath   Dust, Reports smells,perfumes,aerosols HX asthma   Latex Rash        Medication List     TAKE these medications    beclomethasone 80 MCG/ACT inhaler Commonly known as: QVAR Inhale 2 puffs into the lungs daily as needed (for shortenss of breath).   hydrOXYzine 25 MG capsule Commonly known  as: VISTARIL Take 75 mg by mouth in the morning and at bedtime.        Follow-up Information     [facility] Follow up.   Specialty: Obstetrics and Gynecology Why: As scheduled Contact information: [address] [place] [address] [phone]        [facility] Follow up.   Specialty: Obstetrics and Gynecology Why: As needed for emergencies Contact information: [address] [id number] Wilhemina Bonito [place] [address] [phone]        [facility] Follow up.   Specialty: Cardiovascular Intensive Care Why: Return at 8:00 am for imaging Contact information: [address] [id number] Wilhemina Bonito [place] [address] [phone]                Sharen Counter Certified Nurse-Midwife 05/01/2022  8:45 PM

## 2022-05-03 ENCOUNTER — Other Ambulatory Visit: Payer: Self-pay | Admitting: Advanced Practice Midwife

## 2022-05-03 ENCOUNTER — Other Ambulatory Visit: Payer: Medicaid Other

## 2022-05-03 ENCOUNTER — Ambulatory Visit (HOSPITAL_COMMUNITY)
Admission: RE | Admit: 2022-05-03 | Discharge: 2022-05-03 | Disposition: A | Discharge status: Home / Self Care | Payer: Medicaid Other | Source: Ambulatory Visit | Attending: Advanced Practice Midwife | Admitting: Advanced Practice Midwife

## 2022-05-03 DIAGNOSIS — Z6841 Body Mass Index (BMI) 40.0 and over, adult: Secondary | ICD-10-CM | POA: Diagnosis not present

## 2022-05-03 DIAGNOSIS — M79662 Pain in left lower leg: Secondary | ICD-10-CM

## 2022-05-03 DIAGNOSIS — Z3A01 Less than 8 weeks gestation of pregnancy: Secondary | ICD-10-CM | POA: Diagnosis not present

## 2022-05-03 DIAGNOSIS — O3680X0 Pregnancy with inconclusive fetal viability, not applicable or unspecified: Secondary | ICD-10-CM

## 2022-05-16 ENCOUNTER — Encounter: Payer: Self-pay | Admitting: Advanced Practice Midwife

## 2022-05-16 ENCOUNTER — Inpatient Hospital Stay (HOSPITAL_COMMUNITY)
Admission: AD | Admit: 2022-05-16 | Discharge: 2022-05-16 | Disposition: A | Discharge status: Home / Self Care | Payer: Medicaid Other | Attending: Obstetrics & Gynecology | Admitting: Obstetrics & Gynecology

## 2022-05-16 DIAGNOSIS — R109 Unspecified abdominal pain: Secondary | ICD-10-CM

## 2022-05-16 DIAGNOSIS — Z3A08 8 weeks gestation of pregnancy: Secondary | ICD-10-CM | POA: Diagnosis not present

## 2022-05-16 DIAGNOSIS — O99511 Diseases of the respiratory system complicating pregnancy, first trimester: Secondary | ICD-10-CM | POA: Diagnosis not present

## 2022-05-16 DIAGNOSIS — O99341 Other mental disorders complicating pregnancy, first trimester: Secondary | ICD-10-CM | POA: Diagnosis not present

## 2022-05-16 DIAGNOSIS — O26891 Other specified pregnancy related conditions, first trimester: Secondary | ICD-10-CM

## 2022-05-16 DIAGNOSIS — O26899 Other specified pregnancy related conditions, unspecified trimester: Secondary | ICD-10-CM

## 2022-05-16 LAB — URINALYSIS, ROUTINE W REFLEX MICROSCOPIC
Bilirubin Urine: NEGATIVE
Glucose, UA: NEGATIVE mg/dL
Hgb urine dipstick: NEGATIVE
Ketones, ur: NEGATIVE mg/dL
Leukocytes,Ua: NEGATIVE
Nitrite: NEGATIVE
Protein, ur: NEGATIVE mg/dL
Specific Gravity, Urine: 1.021 (ref 1.005–1.030)
pH: 5 (ref 5.0–8.0)

## 2022-05-16 LAB — WET PREP, GENITAL
Clue Cells Wet Prep HPF POC: NONE SEEN
Sperm: NONE SEEN
Trich, Wet Prep: NONE SEEN
WBC, Wet Prep HPF POC: <10 (ref <10)
Yeast Wet Prep HPF POC: NONE SEEN

## 2022-05-16 NOTE — MAU Note (Signed)
.  Emily Golden is a 22 y.o. at [redacted]w[redacted]d here in MAU reporting: started having abd cramping last night. Denies any vag bleeding or discharge.stated all her pregnancy symptoms have gone away (she had swelling in her feet and sore niples and n/v )and she does not feel any of those things anymore and she is worried.  LMP:  Onset of complaint: yesterday Pain score: 7 Vitals:   05/16/22 1841  BP: 133/70  Pulse: 89  Resp: 18  Temp: 97.9 F (36.6 C)     FHT:n/a  Lab orders placed from triage:

## 2022-05-16 NOTE — MAU Note (Signed)
Bedside u/s performed by F. Cres-Dishmon,CNM showed FHR 160.

## 2022-05-16 NOTE — [facility] Provider Note (Signed)
None     Chief Complaint:  Abdominal Pain   Emily Golden is  22 y.o. G4P0030 at [redacted]w[redacted]d presents complaining of Abdominal Pain She is 8.3 weeks, has a confirmed IUP.  She has a hx of early SAB (s), and is very worried because she stopped feeling pregnant.  Has some mild lower abdominal cramping. .     Obstetrical/Gynecological History: OB History     Gravida  4   Para      Term      Preterm      AB  3   Living         SAB  3   IAB      Ectopic      Multiple      Live Births             Past Medical History: Past Medical History:  Diagnosis Date   Allergy    Anxiety    Asthma    Bipolar 1 disorder (HCC)     Past Surgical History: Past Surgical History:  Procedure Laterality Date   NASAL RECONSTRUCTION      Family History: Family History  Problem Relation Age of Onset   Diabetes Maternal Grandmother    Hypertension Maternal Grandmother    Hyperlipidemia Maternal Grandmother    Heart disease Maternal Grandfather    Cancer Maternal Grandfather    Diabetes Maternal Grandfather    Heart failure Neg Hx     Social History: Social History   Tobacco Use   Smoking status: Former    Types: Cigarettes    Quit date: 04/09/2018    Years since quitting: 4.1   Smokeless tobacco: Never   Tobacco comments:    Says no smoking for 2 months  Vaping Use   Vaping Use: Former  Substance Use Topics   Alcohol use: Yes    Alcohol/week: 6.0 standard drinks of alcohol    Types: 6 Glasses of wine per week    Comment: occ   Drug use: Not Currently    Types: Marijuana    Comment: cannabis use, last use 6 months ago    Allergies:  Allergies  Allergen Reactions   Other Shortness Of Breath    Dust, Reports smells,perfumes,aerosols HX asthma    Latex Rash    Meds:  No medications prior to admission.    Review of Systems   Constitutional: Negative for fever and chills Eyes: Negative for visual disturbances Respiratory: Negative for shortness of  breath, dyspnea Cardiovascular: Negative for chest pain or palpitations  Gastrointestinal: Negative for vomiting, diarrhea and constipation Genitourinary: Negative for dysuria and urgency Musculoskeletal: Negative for back pain, joint pain, myalgias.  Normal ROM  Neurological: Negative for dizziness and headaches    Physical Exam  Blood pressure 133/70, pulse 89, temperature 97.9 F (36.6 C), resp. rate 18, height 5\' 7"  (1.702 m), weight 132 kg, last menstrual period 03/18/2022, unknown if currently breastfeeding. GENERAL: Well-developed, well-nourished female in no acute distress.  LUNGS: Normal respiratory effort HEART: Regular rate and rhythm. ABDOMEN: Soft, nontender, nondistended, Pt informed that the ultrasound is considered a limited OB ultrasound and is not intended to be a complete ultrasound exam.  Patient also informed that the ultrasound is not being completed with the intent of assessing for fetal or placental anomalies or any pelvic abnormalities.  Explained that the purpose of today's ultrasound is to assess for fetal viability.  Patient acknowledges the purpose of the exam and the limitations of the study.  FHR 160bpm    Labs: Results for orders placed or performed during the hospital encounter of 05/16/22 (from the past 24 hour(s))  Wet prep, genital   Collection Time: 05/16/22  6:54 PM   Specimen: PATH Cytology Cervicovaginal Ancillary Only  Result Value Ref Range   Yeast Wet Prep HPF POC NONE SEEN NONE SEEN   Trich, Wet Prep NONE SEEN NONE SEEN   Clue Cells Wet Prep HPF POC NONE SEEN NONE SEEN   WBC, Wet Prep HPF POC <10 <10   Sperm NONE SEEN   Urinalysis, Routine w reflex microscopic -Urine, Clean Catch   Collection Time: 05/16/22  7:00 PM  Result Value Ref Range   Color, Urine AMBER (A) YELLOW   APPearance HAZY (A) CLEAR   Specific Gravity, Urine 1.021 1.005 - 1.030   pH 5.0 5.0 - 8.0   Glucose, UA NEGATIVE NEGATIVE mg/dL   Hgb urine dipstick NEGATIVE  NEGATIVE   Bilirubin Urine NEGATIVE NEGATIVE   Ketones, ur NEGATIVE NEGATIVE mg/dL   Protein, ur NEGATIVE NEGATIVE mg/dL   Nitrite NEGATIVE NEGATIVE   Leukocytes,Ua NEGATIVE NEGATIVE   Imaging Studies:    Assessment: Emily Golden is  22 y.o. G4P0030 at [redacted]w[redacted]d presents with no evidence of failing pregnancy.  Plan: Pt reassured that pregnancy is still viable.   Jacklyn Shell 5/13/20247:51 PM

## 2022-05-17 LAB — GC/CHLAMYDIA PROBE AMP (~~LOC~~) NOT AT ARMC
Chlamydia: NEGATIVE
Comment: NEGATIVE
Comment: NORMAL
Neisseria Gonorrhea: NEGATIVE

## 2022-05-19 ENCOUNTER — Encounter: Payer: Self-pay | Admitting: General Practice

## 2022-05-19 ENCOUNTER — Other Ambulatory Visit: Payer: Self-pay

## 2022-05-19 ENCOUNTER — Telehealth: Payer: Self-pay | Admitting: General Practice

## 2022-05-19 ENCOUNTER — Inpatient Hospital Stay (HOSPITAL_COMMUNITY)
Admission: AD | Admit: 2022-05-19 | Discharge: 2022-05-19 | Disposition: A | Discharge status: Home / Self Care | Payer: Medicaid Other | Attending: Obstetrics and Gynecology | Admitting: Obstetrics and Gynecology

## 2022-05-19 ENCOUNTER — Telehealth (INDEPENDENT_AMBULATORY_CARE_PROVIDER_SITE_OTHER): Payer: Medicaid Other

## 2022-05-19 DIAGNOSIS — Z3A08 8 weeks gestation of pregnancy: Secondary | ICD-10-CM | POA: Insufficient documentation

## 2022-05-19 DIAGNOSIS — O26851 Spotting complicating pregnancy, first trimester: Secondary | ICD-10-CM | POA: Insufficient documentation

## 2022-05-19 DIAGNOSIS — Z87891 Personal history of nicotine dependence: Secondary | ICD-10-CM | POA: Diagnosis not present

## 2022-05-19 DIAGNOSIS — T50902A Poisoning by unspecified drugs, medicaments and biological substances, intentional self-harm, initial encounter: Secondary | ICD-10-CM

## 2022-05-19 DIAGNOSIS — Z3689 Encounter for other specified antenatal screening: Secondary | ICD-10-CM

## 2022-05-19 DIAGNOSIS — Z6790 Unspecified blood type, Rh positive: Secondary | ICD-10-CM

## 2022-05-19 DIAGNOSIS — Z3480 Encounter for supervision of other normal pregnancy, unspecified trimester: Secondary | ICD-10-CM | POA: Insufficient documentation

## 2022-05-19 DIAGNOSIS — O209 Hemorrhage in early pregnancy, unspecified: Secondary | ICD-10-CM | POA: Diagnosis present

## 2022-05-19 DIAGNOSIS — F322 Major depressive disorder, single episode, severe without psychotic features: Secondary | ICD-10-CM

## 2022-05-19 MED ORDER — BLOOD PRESSURE MONITORING DEVI: 1.0000 | 0 refills | Status: DC

## 2022-05-19 NOTE — Patient Instructions (Signed)
[place] [facility] Providers  [place] ([address]) [facility] Brown, MD; Chambliss, MD; Eniola, MD; Hensel, MD; McDiarmid, MD; McIntyer, MD [address] [place], [place] [address] [phone] Mon-Fri 8:30-12:30, 1:30-5:00  Providers come to see babies during newborn hospitalization Only accepting infants of Mother's who are seen at Family Medicine Center or have siblings seen at   Family [facility] Center Medicaid - Yes; Tricare - Yes   [facility] Mulberry, MD [address] [place], [place] [address] [phone] Mon, Tue, Thur, Fri 8:30-5:00, Wed 10:00-7:00 (closed 1-2pm daily for lunch) Takes [place] residents with no insurance.  [place] [facility] only with Medicaid/insurance; Tricare - no  [facility] Ben-Davies, MD; Brown, MD; Chandler, MD; Ettefagh, MD; Grant, MD; Hanvey, MD; Herrin, MD; Jones,  MD; Lester, MD; McCormick, MD; McQueen, MD; Simha, MD; Stanley, MD; Stryffeler, NP [address], [place], [place] [address] [phone] Mon, Tue, Thur, Fri 8:30-5:30, Wed 9:30-5:30, Sat 8:30-12:30 Only accepting infants of first-time parents or siblings of current patients Hospital discharge coordinator will make follow-up appointment Medicaid - yes; Tricare - yes  [place] ([address]) [facility] Cox, MD; Davis, MD; Dovico, MD; Ettefaugh, MD; Lowe, MD; Nation, MD; Slimp, MD; Sumner, MD; Williams, MD [address] [place] [address], [place], [place] [address] [phone] Mon-Fri 8:30-5:00, closed for lunch 12:30-1:30; Sat-Sun 10:00-1:00 Accepting Newborns with commercial insurance only, must call prior to delivery to be accepted into  practice.  Medicaid - no, Tricare - yes   [facility] [address] [place], [place] [address] [phone] or [phone] Mon to Fri 8am to 10pm, Sat 8am to 1pm  (virtual only on weekends) Only accepts Medicaid Healthy Blue pts  [facility]  Artis, MD; Coccaro, MD; [place] Gardner, MD; Netherton, NP; Roper, MD; Wilmot, PA-C; Skinner, MD [address] [place], [place] [address] [phone] Mon-Fri 8:30-5:30 Medicaid - yes, Tricare - yes  [place] ([address]) [facility] Warner, MD [address], [place], [place] [address] [phone] Mon, Tues, Wed Fri 8:30-5:00, Sat 8:30-12:00, Closed Thursdays Accepting siblings of established patients and first time mom's if you call prenatally Medicaid- yes; Tricare - yes  [facility] Becker, PA; Hagler, MD; Quinn, PA-C; Scifres, PA; Sun, MD; Swayne, MD;  [address], [place], [place] [address] [phone] Mon-Fri 8:30-5:00, closed for lunch 1-2 Only accepting newborns of established patients Medicaid- no; Tricare - yes  [place] ([address]) [facility] Timberlake, MD; [address], [place], [place] [address] [phone] Mon-Fri 8:00-5:00 Medicaid - No; Tricare - Yes  [facility] [employer]  Brake, NP; Wharton, PA [address], [place], [place] [address] [phone] Mon-Fri 8:00-5:00 Medicaid - No, Tricare - Yes  [facility] Gay, MD; Quinlan, MD; Blatt, DNP [address] [place], [place] [address] [phone]  Mon-Fri 8:00-5:00 Medicaid - No; Tricare - Yes  [facility] [address] [place], [place] [address] [phone] Mon-Fri 8:30-5:00 (lunch 12:00-1:00) Medicaid -Yes; Tricare - Yes  [facility] Jordan, MD [address], [place], [place] [address] [phone] Mon-Fri 8:00-5:00 Seeing newborns of current patients only. No new patients Medicaid - No, Tricare - yes  [facility] Parker, MD [address] [place], [place] [address] [phone] Mon-Fri 8:00-5:00 Medicaid -yes as secondary coverage only;   Tricare - yes  [facility] Brecken, PA; Christy, NP; Dees, MD; DeClaire, MD; DeWeese, MD; Hodge, PA; Smoot, NP; Summer, MD; Vapne, MD [address] [place], [place] [address] [phone] Mon-Fri 8:30-5:00, Sat 9:00-11:00 Accepts commercial insurance ONLY. Offers free prenatal information sessions for families. Medicaid - No, Tricare - Call first  [facility] Bouska, MD; Gordon, PA; Jeffery, PA; Weber, PA [address] [place] [address] [phone] Mon-Fri 7:30-5:30 Medicaid - Yes; Tricare - yes  [place] ([address] & [address])  [facility] Reese, MD [address] [place], [place] [address] [phone] Mon-Thur 8:00-6:00, closed for lunch 12-2, closed Fridays Medicaid - yes; Tricare - no  [facility] Anderson, NP; Badger, MD; Beal, PA; Spencer, PA [address], [place], [place] [address] [phone] Mon-Fri 7:30-4:30 Medicaid - yes, Tricare - yes  [facility]  Agbuya, MD; Klett, NP; Romgoolam, MD; Rothstein, NP [address], [place], [place] [address] [phone] Mon-Fri 8:30-5:00, closed for lunch 1-2, Sat 8:30-12:00 - sick visits only Providers come to see babies at [facility] Only accepting newborns of siblings and first time parents ONLY if who have met with office prior to delivery Medicaid -Yes; Tricare - yes  [facility]  Golden, DO; Friddle, NP; Wallace, MD; Wood, MD:  [address], [place], [place] [address] [phone] Mon- Fri 8:00-5:00, Sat 9:00-12:00 - sick visits only Accepting siblings of established patients and first time mom/baby Medicaid - Yes; Tricare - yes Patients must have vaccinations (baby vaccines)  [place] ([address] &  [address])  [facility] [address] [place], [place] [address] [phone] Mon-Fri 8:00-5:00 Medicaid - no; Tricare - yes  [facility] Briscoe, MD; Schmidt, PA; Moreira, PA [address], [place], [place] [address] [phone] Mon-Fri 8:00-5:00 Medicaid- yes; Tricare - yes  [facility] Boyd, MD; Jones, NP; Osborn, PA [address], [place], [place] [address] [phone] Mon-Fri 8:00-5:00 Medicaid - Yes; Tricare - yes  [place] ([address])  [facility] Atkinson, PA; Calderon, PA; Cummings, MD; Dillard, MD; Henrish, NP; Isenhour, DO; Martin, PA; Olson, MD; Ott, MD; Phillips, MD; Valente, PA; VanDeven, PA; Yonjof, NP [address], [place], [place] [address] [phone] Mon-Fri 8:30-5:00, Sat 9:00-12:00 - sick only Please register online [email or web address] then schedule online or call office Medicaid-Yes; Tricare -yes  [facility]  Dabrusco, MD; Dial, MD; Tangipahoa, MD; Fleenor, NP; Goolsby, PA; Tonuzi, MD; Turner, NP; West, MD [address], [place], [place] [address] [phone] Mon-Fri 8:00-5:30, Sat&Sun by appointment (phones open at 8:30) Medicaid - Yes; Tricare - yes  [place] ([address]) [facility] Allen, CPNP; Bates, MD; Gordon, MD; Mills, NP; Weinshilboum, DO [address], [place], [place] [address] [phone] M-F 8:00 - 5:15, Sat/Sun 9-12 sick visits only Medicaid - No; Tricare - yes  [facility] [place] [facility]  Brown, PA-C; Cowen, PA-C; Dennis, DO; Fuster, PA-C; Martin, PA-C; Shelton, PA-C; Spry, MD [address] [place], [place] [address] [phone] Mon-Thur 8:00-7:00, Fri 8:00-5:00 Accepting Medicaid for 13 and under only   [facility] [place] [facility] [place] Hayes, FNP; List, FNP; Moran, MD; Pitonzo, PA-C; Scholer,   MD; Spangle, FNP; Nzenwa, FNP; Jasper, MD; Moran, MD [address] [place], [place] [address] [phone] Mon-Fri 8:30-5:30 Medicaid - Yes; Tricare - yes  [facility] [address] [facility]  Kelly, CPNP; Logan, MD; Poth, MD; Ramadoss, MD; Staton, NP [address], [place], [place] [address] [phone] Mon-Thur 8:00-5:30, Fri 8:00-5:00, Sat 9:00-12:00 Medicaid - yes, Tricare - yes  [place] ([address])  [facility] [place] Masneri, DO; Meyers, MD; Nelson, PA [address], [place], [place] [address] [phone] Mon-Fri 8:00-5:00, closed for lunch 12-1 Medicaid - No; Tricare - yes  [facility] [place] McGowen, MD [address], [place], [place] [address] [phone] Mon-Fri 8:00-5:00 Medicaid - No; Tricare - yes  [facility] [place] MacDonald, MD; Nayak, MD; Kearns, MD; Jones, MD [address] [place] [address], [place], [place] [address] [phone] Mon-Fri 8:00-5:00 Medicaid- Yes; Tricare - yes  [place] ([address])  [facility] [place] [facility] Martin, PA-C; Tabori, MD [address], [place], [place] [address] [phone] Mon-Fri 8:00-5:00 Medicaid - No; Tricare - yes  [facility] [place]  Margin - CPNP [address], [place], [place] [address] [phone] Mon-Weds 8:00-6:00, Thurs-Fri 8:00-5:00, Sat 9:00-12:00 Medicaid - yes; Tricare - yes   [facility] [place] Aubuchon, MD; Brandon, PA [address] [place], [place] [address] [phone] Mon-Fri 8:00-5:00 Medicaid - yes; Tricare - yes  [place] [facility] Providers  [facility] [address], [place], [place] [address] [phone] M, Thur: 8am -8pm, Tues, Weds: 8am - 5pm; Fri: 8-1 Medicaid - Yes; Tricare -  yes  [facility] Mertz, MD; Johnson, MD; Wells, MD; Downs, PA; Hockenberger, PA [address], [place], [place] [address] [phone] M-F 8:30 - 5:00 Medicaid - Call office; Tricare -yes  [facility] Bonney, MD; Page, MD, Minter, MD; Mueller, PNP; Thomason, NP [address], [place], [place] [address] [phone] M-F 8:30 - 5:00, Sat/Sun 8:30 - 12:30 (sick visits) Medicaid - Call office; Tricare -yes  [facility] Lewis, MD; Shaub, PNP; Boylston, MD; Quaile, PA; Nonato, NP; Landon, CPNP [address], [place], [place] [address] [phone] M-F 8:30 - 5:00 Medicaid - Call office; Tricare - yes  [facility] [place] Cline, MD; Dvergsten, MD; Flores, MD; Kawatu, MD; Nogo, MD [address], [place], [place] [address] [phone] M-Thur: 8:00 - 5:00; Fri: 8:00 - 4:00 Medicaid - yes; Tricare - yes  [facility] [address] [place], [place], [place] [address] [phone] M-F: 8:30- 5:00, closed for lunch 12:30 - 1:00 Medicaid - yes; Tricare -yes  [facility] [place] [address], [place], [place] [address] [phone] M-F 8:00 - 5:00 Medicaid - yes; Tricare - yes  [facility] Johnson, DO; Rumball, DO; Wicker, NP [address] [place] [address], [place], [place] [address] [phone] M-F 8:00 - 5:00, Closed 12-1 for lunch Medicaid - Call; Tricare - yes  [facility] Stein, MD [address], [place], [place] [address] [phone] M-F: 8:00-5:00, Sat: 8:00 - noon Medicaid - call; Tricare -yes  [place] [facility] Providers  [facility] Collins, FNP-C [address], [place], [place] [address] [phone] M-W: 8:00-5:00, Thur: 8:00 - 7:00, Fri: 8:00 - noon Medicaid - yes; Tricare - yes  [facility] [place] Adams, FNP [address], [place],   [place] [address] [phone] M-F 8:00 - 5:00, Closed for lunch 12-1 Medicaid -  yes; Tricare - yes  [place] [facility] Providers  [facility] Smith, FNP, Melvin, MD, Fay, FNP-C [address] [place] [address] [place], [place], [place] [address] [phone] M-T 8:00-5:00, Wed-Fri 7:00-6:00 Medicaid - Yes; Tricare -yes  [facility] [place] Civiletti, DO; [address], [place], [place] [address] [phone] M-F 8:00 - 5:00, closed for lunch 12-1 Medicaid - Yes; Tricare - yes  [facility]  Barnes, MD; Bergdolt, MD; Caulfield, MD; Emrich, MD; Fiscus, MD; Hoppens, MD; Kylstra, MD, McPherson, MD; Todd, MD; Prestwood, MD; Waters, MD; Wood, MD [address], [place], [place] [address] [phone] M-F 8:00-5:00 Medicaid - yes; Tricare - yes  [facility] Cheema, MD (speaks Punjabi and Hindi) [address] [place], [place] [address] [phone] M-F: 8:30 - 5:00, closed 12:30 - 1 for lunch Medicaid - Yes; Tricare -yes  [place] [facility] Providers  [facility] Loda, MD; Timberlake, MD; Burke, MD [address], [place], [place] [address] [phone] M-Th: 8:00 - 5:30, Fri: 8:00 - 12:00 Medicaid - yes; Tricare - yes  [facility] [place] Lookabill, NP; Meier, MD; Daffron, MD [address], [place], [place] [address] [phone] M-F: 8:00 - 5:00 Medicaid - yes; Tricare - yes  [facility] Busse, NP; Bowman, NP; Baune, NP; Entwistle, MD; Williams, MD, Huffman, NP, Ferguson, MD; Patel, DO [address], [place], [place] [address] [phone] M-F: 8:30 - 5:30p Medicaid - yes; Tricare - yes Other locations available as well  [facility] Rajan, MD; Wilson, MD; Morgan, PA-C, Domenech, PA-C; Myers, PA-C [address], [place], [place] [address] [phone] M-W: 8:00am - 7:00pm, Thurs: 8:00am - 8:00pm; Fri: 8:00am -  5:00pm, closed daily from 12-1 for lunch Medicaid - yes; Tricare - yes  [place] [facility] Providers  [facility] [place] Adams, MD; Crystal, FNP; Hadley, MD; Stokes, MD; Johnson, PNP; Brady, PA-C; West, PNP; Gardner, MD;  [address], [place], [place] [address] [phone] M - Fri: 8am - 5pm, Sat 9-noon Medicaid - Yes; Tricare -yes  [facility] [place] Nayak, MD; Jones, FNP; McDonald, MD; Kearns, MD [address] [place] [address], [place], [place] [phone] M-F 8:00 - 5:00 Medicaid - call; Tricare - yes  [facility] [place] Bell, MD; Emory, PNP; Pinder, MD; Anderson, MD; Light, PA-C; Johnson, MD; Latta, MD; Saul, PNP; Rainey, MD; Clifford, MD; McClung, MD [address], [place], [place] [address] [phone] M-F 8:00am - 5:00pm; Sat. 9:00 - 11:00 Medicaid - yes; Tricare - yes  [facility] [place] Soldato-Couture, MD [address], [place], [place] [address] [phone] M-F 8:00 - 5:00 Medicaid - [place] Medicaid only; Tricare - yes  [facility] [place] Walker, MD; Davis, PNP; Ajizian, MD [address] [place] [address], [place],  [address] [phone] M-F 8:00 - 5:00 Medicaid - yes; Tricare - yes  [facility] [place] Barry, MD; Brown, MD, Forest, MD, Hazek, MD; Hoyle, MD; Smith, MD; [address] [place], [place],  [address] [phone] M-F: 8-5 Medicaid - yes; Tricare - yes  [facility] [place] Brady, Md; Dowlen, MD; [address] [place] [address], [place],  [address] [phone] M-F 8-5 Medicaid - yes; Tricare - yes  [facility] Kearns, MD; Nayak, MD;   Sold[place]-Courture, MD; Pellam-Palmer, DNP; Herring, PNP [address], [place], [place] [address] [phone] M-F 8-5 Medicaid - yes; Tricare - yes  [facility] Weathers, MD;  Merritt, PA-C; Davis-PA-C; Warnimont, MD [address], [place], [place] [address] [phone] M-F 7am - 5 pm Medicaid - call; Tricare - yes  [facility] Hill, PNP; Erickson, MD; Robinson, MD [address], [place], [place] [address] [phone] M-F 8-5 Medicaid - yes; Tricare - yes  [facility] Kribbs, MD; Warner, MD; Williams, FNP; Brooks, FNP; Boles, FNP; Romblad, PA-C; [place] - FNP [address], [place], [place] [address] [phone] M-F 8-5 Medicaid- yes; Tricare - yes  [facility] [place] Yoder, MD; Verenes, MD; Armentrout, MD; Stewart, MD; Beasley, CPNP; Ford, MD; Erickson, MD; Rice, MD [address], [place], [place] [address] [phone] M-F: 8-5, Sat: 9-4, Sun 9-12 Medicaid - yes; Tricare - yes  [facility] Little, PNP; Davis, PNP [address], [place], [place] [address] [phone] M-F 8 - 5, closed 12-1 for lunch Medicaid - yes; Tricare - yes  [facility] Friesen, MD; Cnegia, MD; Rice, MD; Patel, DO [address], [place],  [place] [address] [phone] M-F 8- 5:30 Medicaid - yes; Tricare - yes  [facility] [place] Zvolensky, MD; Ray, MD; Haas, MD [address] [address], [place], [place] [address] [phone] M: 8-7; Tues-Fri: 8-5; Sat: 9-12 Medicaid - yes; Tricare - yes  [facility] [place] Heinrich, MD; Meyer, MD; Clark, MD; Rhyne, MD; Aubuchon, MD [address], [place], [place] [address] [phone] M: 8-7; Tues-Fri: 8-5; Sat: 8:30-12:30 Medicaid - yes; Tricare - yes  [facility] [place] Bista, MD; Dillard, PA [address], [place], [place] [address] [phone] Mon-Fri: 8-5 Medicaid - yes;  Tricare - yes  [facility] [place] Beasley, CPNP; Mahle, CPNP; Rice, MD; Duffy, MD; Culler, MD; [address], [place], [place] [address] [phone] M-F: 8-5, closed 1-2 for lunch Medicaid - yes; Tricare - yes  [facility] Rickman, PA; Mounce, NP; Smith, MD; Jordan, CPNP; Darty, PA; Ball, MD; Wallace, MD [address] [place] [address], [place], [place] [address] [phone] M-Thurs: 8-7; Fri: 8-6; Sat: 9-12; Sun 2-4 Medicaid - yes; Tricare - yes  [facility] Brown, MD; Shin, MD; Goodman, DNP, FNP; Sebesta, DO; [address], [place], [place] [address] [phone] M-F: 8-5 Medicaid - yes; Tricare - yes  [place] [facility] Providers  [facility] Dough, MD; Welsh, NP [address], [place], [place] [address] [phone] M - Fri: 8am - 5pm, closed for lunch 12-1 Medicaid - Yes; Tricare - yes  [facility] Manandhar, MD; Riley, MD; Sanger, DO; Vinocur, MD;Hall, PA; Walsh, PA; Campbell, NP [address], [place]  [address] [phone] M-F 8:00 - 5:00, Sat 8:00 - 11:30 Medicaid - yes; Tricare - yes  [facility] Khan, MD; Redding, MD, Street, MD, Holt, MD, Burgart, MD; Rhyne, NP; Dickinson, PA;  [address] [place] [address], [place],  [address] [phone] M-F 8:10am - 5:00pm Medicaid - yes; Tricare - yes    [facility] Connors, MD; Kime, NP [address], [place], [place] [address] [phone] M-F 8:00 - 5:00 Medicaid - [place] Medicaid only; Tricare - yes  [facility] [place] Whyte, MD; Fox, NP [address], [place], [place] [address] [phone] M-F 8:00 - 5:00; Closed for lunch 12 - 1:00 Medicaid - yes;  Tricare - yes  [facility] Penner, MD; Wilburn, FNP [address], [place], [place] [address] [phone] Mon 9-5; Tues/Wed 10-5; Thurs 8:30-5; Fri: 8-12:30 Medicaid - yes; Tricare - yes  [place] [facility] Providers  [facility]  Golding, MD; Jackson, PA-C [address], [place], [place] [address] [phone] phone [phone] fax M-F 7:15 - 4:30 Medicaid - yes; Tricare - yes  [facility] Gosrani, MD; Meccariello, DO [address] , [place] [address] [phone] M-Fri: 8:30 - 5:00, closed for lunch everyday noon - 1pm Medicaid - Yes; Tricare - yes  [facility] Burdine, MD; Daniel, MD; Howard, MD; Sasser, MD; Boles, PA; Boyd, PA-C; Carroll, PA; McGee, PA; Skillman, PA; Wilson, PA [address] [place], [place] [address] [phone] M-Thurs: 7:30am - 7:00pm; Friday 7:30am - 4pm; Sat: 8:00 - 1:00 Medicaid - Yes; Tricare - yes  [facility] [place] Akhbari, MD; Law, MD; Qayumi, MD; Salvador, DO [address], [place], [place] [address] [phone] M-Thur: 8:00 - 5:00, Fri: 8:00 - Noon Medicaid - yes; Tricare - yes No [place] [employer]   [facility] [place] [facility] Dettinger, MD; Gottschalk, DO; Hawks, NP; Martin, NP; Morgan, NP; Milian, NP; Rakes, NP; Stacks, MD; [place], PA [address], [place], [place] [address] [phone] M-F 8:00 - 5:00 Medicaid - yes; Tricare - yes  [facility] Collins, FNP-C; Bucio, FNP-C [address], [place], [place] [address] [phone] M, W, R 8:00-5:00, Tues: 8:00am - 7:00pm; Fri 8:00 - noon Medicaid - Yes; Tricare - yes  [facility] Khan, MD [address] [place] [address] [place], [place] [address] [phone]  M-Thurs 8:30-5:30, Fri: 8:30-12:30pm Medicaid - Yes; Tricare - N     Safe Medications in Pregnancy   Acne:  Benzoyl Peroxide  Salicylic Acid    Backache/Headache:  Tylenol: 2 regular strength every 4 hours OR               2 Extra strength every 6 hours   Colds/Coughs/Allergies:  Benadryl (alcohol free) 25 mg every 6 hours as needed  Breath right strips  Claritin  Cepacol throat lozenges  Chloraseptic throat spray  Cold-Eeze- up to three times per day  Cough drops, alcohol free  Flonase (by prescription only)  Guaifenesin  Mucinex  Robitussin DM (plain only, alcohol free)  Saline nasal spray/drops  Sudafed (pseudoephedrine) & Actifed * use only after [redacted] weeks gestation and if you do not have high blood pressure  Tylenol  Vicks Vaporub  Zinc lozenges  Zyrtec   Constipation:  Colace  Ducolax suppositories  Fleet enema  Glycerin suppositories  Metamucil  Milk of magnesia  Miralax  Senokot  Smooth move tea   Diarrhea:  Kaopectate  Imodium A-D   *NO pepto Bismol   Hemorrhoids:  Anusol  Anusol HC  Preparation H  Tucks   Indigestion:  Tums  Maalox  Mylanta  Zantac  Pepcid   Insomnia:  Benadryl (alcohol free) 25mg every 6 hours as needed  Tylenol PM    Unisom, no Gelcaps   Leg Cramps:  Tums  MagGel   Nausea/Vomiting:  Bonine  Dramamine  Emetrol  Ginger extract  Sea bands  Meclizine  Nausea medication to take during pregnancy:  Unisom (doxylamine succinate 25 mg tablets) Take one tablet daily at bedtime. If symptoms are not adequately controlled, the dose can be increased to a maximum recommended dose of two tablets daily (1/2 tablet in the morning, 1/2 tablet mid-afternoon and one at bedtime).  Vitamin B6 100mg tablets. Take one tablet twice a day (up to 200 mg per day).   Skin Rashes:  Aveeno products  Benadryl cream or 25mg every 6 hours as needed  Calamine Lotion  1% cortisone cream   Yeast infection:  Gyne-lotrimin 7  Monistat 7    **If taking multiple medications, please check labels to avoid duplicating the same active ingredients  **take medication as directed on the label   ** Do not exceed 4000 mg of tylenol in 24 hours  **Do not take medications that contain aspirin or ibuprofen           

## 2022-05-19 NOTE — MAU Note (Addendum)
Emily Golden is a 22 y.o. at [redacted]w[redacted]d here in MAU reporting: she woke up @ 1000 this morning and was noted to be spotting.  Reports last intercourse approximately 1 week ago. Reports "light cramping".  Denies urinary tract symptoms.  LMP: NA Onset of complaint: today Pain score: 3 Vitals:   05/19/22 1141  BP: 132/71  Pulse: 74  Resp: 20  Temp: 98.3 F (36.8 C)  SpO2: 100%     FHT:NA Lab orders placed from triage:   None

## 2022-05-19 NOTE — MAU Provider Note (Signed)
History     CSN: [id number]  Arrival date and time: 05/19/22 1126  Chief Complaint  Patient presents with   Vaginal Bleeding   22 y.o. [id number] @[redacted]w[redacted]d  presenting with spotting and cramping. Reports onset of brown/red spotting in her underwear and when she wiped about 1 hr ago. Reports cramping has been ongoing for a while. Rates pain 3/10. Hasn't needed to take anything. Denies recent IC. Denies urinary sx.     OB History     Gravida  4   Para      Term      Preterm      AB  3   Living         SAB  3   IAB      Ectopic      Multiple      Live Births              Past Medical History:  Diagnosis Date   Allergy    Anxiety    Asthma    Bipolar 1 disorder (HCC)     Past Surgical History:  Procedure Laterality Date   NASAL RECONSTRUCTION      Family History  Problem Relation Age of Onset   Diabetes Maternal Grandmother    Hypertension Maternal Grandmother    Hyperlipidemia Maternal Grandmother    Heart disease Maternal Grandfather    Cancer Maternal Grandfather    Diabetes Maternal Grandfather    Heart failure Neg Hx     Social History   Tobacco Use   Smoking status: Former    Types: Cigarettes    Quit date: 04/09/2018    Years since quitting: 4.1   Smokeless tobacco: Never   Tobacco comments:    Says no smoking for 2 months  Vaping Use   Vaping Use: Former  Substance Use Topics   Alcohol use: Yes    Alcohol/week: 6.0 standard drinks of alcohol    Types: 6 Glasses of wine per week    Comment: occ   Drug use: Not Currently    Types: Marijuana    Comment: cannabis use, last use 6 months ago    Allergies:  Allergies  Allergen Reactions   Other Shortness Of Breath    Dust, Reports smells,perfumes,aerosols HX asthma    Latex Rash    Medications Prior to Admission  Medication Sig Dispense Refill Last Dose   beclomethasone (QVAR) 80 MCG/ACT inhaler Inhale 2 puffs into the lungs daily as needed (for shortenss of breath).       hydrOXYzine (VISTARIL) 25 MG capsule Take 75 mg by mouth in the morning and at bedtime.       Review of Systems  Gastrointestinal:  Positive for abdominal pain.  Genitourinary:  Positive for vaginal bleeding. Negative for dysuria, frequency, hematuria and urgency.   Physical Exam   Blood pressure 132/71, pulse 74, temperature 98.3 F (36.8 C), temperature source Oral, resp. rate 20, height 5' 7.5" (1.715 m), weight 130.4 kg, last menstrual period 03/18/2022, SpO2 100 %, unknown if currently breastfeeding.  Physical Exam Vitals and nursing note reviewed.  Constitutional:      General: She is not in acute distress.    Appearance: Normal appearance.  HENT:     Head: Normocephalic and atraumatic.  Cardiovascular:     Rate and Rhythm: Normal rate.  Pulmonary:     Effort: Pulmonary effort is normal. No respiratory distress.  Abdominal:     Palpations: Abdomen is soft.  Tenderness: There is no abdominal tenderness.  Musculoskeletal:        General: Normal range of motion.     Cervical back: Normal range of motion.  Skin:    General: Skin is warm and dry.  Neurological:     General: No focal deficit present.     Mental Status: She is alert and oriented to person, place, and time.  Psychiatric:        Mood and Affect: Mood normal.        Behavior: Behavior normal.   Limited bedside US: IUGS, FP, and YS visualized, FHR 171 bpm, subj. nml AFV.  MAU Course  Procedures  MDM [place] confirms viability. Pt reassured. Discussed SAB precautions in detail. Stable for discharge home.  Assessment and Plan   1. [redacted] weeks gestation of pregnancy   2. Spotting affecting pregnancy in first trimester   3. Blood type, Rh positive    Discharge home [facility] as scheduled SAB precautions  Allergies as of 05/19/2022       Reactions   Other Shortness Of Breath   Dust, Reports smells,perfumes,aerosols HX asthma   Latex Rash        Medication List     TAKE these medications     beclomethasone 80 MCG/ACT inhaler Commonly known as: QVAR Inhale 2 puffs into the lungs daily as needed (for shortenss of breath).   hydrOXYzine 25 MG capsule Commonly known as: VISTARIL Take 75 mg by mouth in the morning and at bedtime.        Donette Larry, CNM 05/19/2022, 11:57 AM

## 2022-05-19 NOTE — Progress Notes (Addendum)
Bedside US done by M. Denyse Amass, CNM.  Heart motion seen, HR 171 bpm.

## 2022-05-19 NOTE — Telephone Encounter (Signed)
Patient called and left message on nurse voicemail line that she is having bleeding and wants to know if she should go to the hospital.   Called patient, who states she was seen in MAU earlier today and everything was fine. Patient has no questions

## 2022-05-19 NOTE — Progress Notes (Signed)
New OB Intake  I connected with Emily Golden  on 05/19/22 at  3:15 PM EDT by MyChart Video Visit and verified that I am speaking with the correct person using two identifiers. Nurse is located at [facility] and pt is located at home.  I discussed the limitations, risks, security and privacy concerns of performing an evaluation and management service by telephone and the availability of in person appointments. I also discussed with the patient that there may be a patient responsible charge related to this service. The patient expressed understanding and agreed to proceed.  I explained I am completing New OB Intake today. We discussed EDD of 12/23/22 that is based on LMP of 03/18/22. Pt is G4/P0. I reviewed her allergies, medications, Medical/Surgical/OB history, and appropriate screenings. I informed her of [facility] services. [facility] information placed in AVS. Based on history, this is a low risk pregnancy.  Patient Active Problem List   Diagnosis Date Noted   Supervision of other normal pregnancy, antepartum 05/19/2022   Grief 03/07/2022   Miscarriage 09/21/2021   Bipolar 2 disorder (HCC) 07/02/2021   GAD (generalized anxiety disorder) 07/02/2021   ADHD 07/02/2021   Tobacco use disorder 07/02/2021   Alcohol consumption binge drinking 07/02/2021   Suicide attempt by drug ingestion (HCC) 06/09/2018   Ingestion of unknown substance 06/08/2018   PTSD (post-traumatic stress disorder)    Syncope 06/24/2015   Insulin resistance 06/24/2015   Morbid childhood obesity with BMI greater than 99th percentile for age [facility]) 06/24/2015    Concerns addressed today  Delivery Plans Plans to deliver at [facility]. Patient given information for [facility] Healthy Baby website for more information about [facility]. Patient is not interested in water birth. Offered upcoming OB visit with CNM to discuss further.  MyChart/Babyscripts MyChart access verified. I explained pt will have some visits in office and  some virtually. Babyscripts instructions given and order placed. Patient verifies receipt of registration text/e-mail. Account successfully created and app downloaded.  Blood Pressure Cuff/Weight Scale Blood pressure cuff ordered for patient to pick-up from [employer]. Explained after first prenatal appt pt will check weekly and document in Babyscripts. Patient does have weight scale.  Anatomy US Explained first scheduled Korea will be around 19 weeks. Anatomy US scheduled for 07/29/22 at 0945a. Pt notified to arrive at 0930a.  Labs Discussed Avelina Laine genetic screening with patient. Would like both Panorama and Horizon drawn at new OB visit. Routine prenatal labs needed.  COVID Vaccine Patient has had COVID vaccine.   Is patient a CenteringPregnancy candidate?  Not a Candidate  Not a candidate due to  Mental Issues on Meds     Is patient a Mom+Baby Combined Care candidate?  Not a candidate     Social Determinants of Health Food Insecurity: Patient denies food insecurity. WIC Referral: Patient is interested in referral to [facility].  Transportation: Patient denies transportation needs. Childcare: Discussed no children allowed at ultrasound appointments. Offered childcare services; patient declines childcare services at this time.  Interested in [place]? If yes, send referral and doula dot phrase.   First visit review I reviewed new OB appt with patient. Explained pt will be seen by Dr. Jolayne Panther at first visit; encounter routed to appropriate provider. Explained that patient will be seen by pregnancy navigator following visit with provider.   Henrietta Dine, CMA 05/19/2022  3:52 PM

## 2022-05-24 ENCOUNTER — Telehealth: Payer: Self-pay | Admitting: Clinical

## 2022-05-24 NOTE — Telephone Encounter (Signed)
Attempt call regarding referral;  Pt declines referral as she already has a therapist.

## 2022-05-25 ENCOUNTER — Encounter: Payer: Medicaid Other | Admitting: Obstetrics and Gynecology

## 2022-05-26 DIAGNOSIS — K589 Irritable bowel syndrome without diarrhea: Secondary | ICD-10-CM | POA: Insufficient documentation

## 2022-05-26 HISTORY — DX: Irritable bowel syndrome, unspecified: K58.9

## 2022-07-11 DIAGNOSIS — O99210 Obesity complicating pregnancy, unspecified trimester: Secondary | ICD-10-CM | POA: Insufficient documentation

## 2022-07-28 DIAGNOSIS — R9389 Abnormal findings on diagnostic imaging of other specified body structures: Secondary | ICD-10-CM | POA: Insufficient documentation

## 2022-07-29 ENCOUNTER — Other Ambulatory Visit: Payer: Medicaid Other

## 2022-07-29 ENCOUNTER — Ambulatory Visit: Payer: MEDICAID

## 2022-08-14 ENCOUNTER — Inpatient Hospital Stay (HOSPITAL_COMMUNITY): Payer: MEDICAID

## 2022-08-14 ENCOUNTER — Encounter (HOSPITAL_COMMUNITY): Payer: Self-pay | Admitting: Obstetrics and Gynecology

## 2022-08-14 ENCOUNTER — Inpatient Hospital Stay (HOSPITAL_COMMUNITY)
Admission: AD | Admit: 2022-08-14 | Discharge: 2022-08-14 | Disposition: A | Discharge status: Home / Self Care | Payer: MEDICAID | Attending: Obstetrics and Gynecology | Admitting: Obstetrics and Gynecology

## 2022-08-14 DIAGNOSIS — R52 Pain, unspecified: Secondary | ICD-10-CM | POA: Diagnosis not present

## 2022-08-14 DIAGNOSIS — K8070 Calculus of gallbladder and bile duct without cholecystitis without obstruction: Secondary | ICD-10-CM

## 2022-08-14 DIAGNOSIS — Z3A21 21 weeks gestation of pregnancy: Secondary | ICD-10-CM | POA: Diagnosis not present

## 2022-08-14 DIAGNOSIS — O26892 Other specified pregnancy related conditions, second trimester: Secondary | ICD-10-CM | POA: Diagnosis not present

## 2022-08-14 DIAGNOSIS — O26619 Liver and biliary tract disorders in pregnancy, unspecified trimester: Secondary | ICD-10-CM

## 2022-08-14 DIAGNOSIS — O26612 Liver and biliary tract disorders in pregnancy, second trimester: Secondary | ICD-10-CM

## 2022-08-14 LAB — COMPREHENSIVE METABOLIC PANEL
ALT: 22 U/L (ref 0–44)
AST: 17 U/L (ref 15–41)
Albumin: 2.7 g/dL — ABNORMAL LOW (ref 3.5–5.0)
Alkaline Phosphatase: 68 U/L (ref 38–126)
Anion gap: 11 (ref 5–15)
BUN: 5 mg/dL — ABNORMAL LOW (ref 6–20)
CO2: 22 mmol/L (ref 22–32)
Calcium: 9.4 mg/dL (ref 8.9–10.3)
Chloride: 104 mmol/L (ref 98–111)
Creatinine, Ser: 0.61 mg/dL (ref 0.44–1.00)
GFR, Estimated: >60 mL/min (ref >60)
Glucose, Bld: 87 mg/dL (ref 70–99)
Potassium: 3.9 mmol/L (ref 3.5–5.1)
Sodium: 137 mmol/L (ref 135–145)
Total Bilirubin: 0.4 mg/dL (ref 0.3–1.2)
Total Protein: 5.8 g/dL — ABNORMAL LOW (ref 6.5–8.1)

## 2022-08-14 LAB — CBC WITH DIFFERENTIAL/PLATELET
Abs Immature Granulocytes: 0.09 10*3/uL — ABNORMAL HIGH (ref 0.00–0.07)
Basophils Absolute: 0 10*3/uL (ref 0.0–0.1)
Basophils Relative: 0 %
Eosinophils Absolute: 0 10*3/uL (ref 0.0–0.5)
Eosinophils Relative: 1 %
HCT: 35.9 % — ABNORMAL LOW (ref 36.0–46.0)
Hemoglobin: 11.8 g/dL — ABNORMAL LOW (ref 12.0–15.0)
Immature Granulocytes: 1 %
Lymphocytes Relative: 18 %
Lymphs Abs: 1.5 10*3/uL (ref 0.7–4.0)
MCH: 27.8 pg (ref 26.0–34.0)
MCHC: 32.9 g/dL (ref 30.0–36.0)
MCV: 84.7 fL (ref 80.0–100.0)
Monocytes Absolute: 0.6 10*3/uL (ref 0.1–1.0)
Monocytes Relative: 8 %
Neutro Abs: 5.9 10*3/uL (ref 1.7–7.7)
Neutrophils Relative %: 72 %
Platelets: 194 10*3/uL (ref 150–400)
RBC: 4.24 MIL/uL (ref 3.87–5.11)
RDW: 14 % (ref 11.5–15.5)
WBC: 8.1 10*3/uL (ref 4.0–10.5)
nRBC: 0 % (ref 0.0–0.2)

## 2022-08-14 MED ORDER — ALUM & MAG HYDROXIDE-SIMETH 200-200-20 MG/5ML PO SUSP
30.0000 mL | Freq: Once | ORAL | Status: AC
  Administered 2022-08-14: 30 mL via ORAL
  Filled 2022-08-14: qty 30

## 2022-08-14 MED ORDER — ONDANSETRON 4 MG PO TBDP
4.0000 mg | ORAL_TABLET | Freq: Once | ORAL | Status: AC
  Administered 2022-08-14: 4 mg via ORAL
  Filled 2022-08-14: qty 1

## 2022-08-14 NOTE — MAU Note (Addendum)
.  Emily Golden is a 22 y.o. at [redacted]w[redacted]d here in MAU reporting: epigastric pain that radiates to back bilaterally-nausea and vomiting Denies OB complaints-cramping, SROM, vaginal bleeding or discharge. Reports + fetal movement Also reports constipation with hemorrhoids Last BM 08/10-small hard stool Seen on 08/08/2022-reports OB specious for gall bladder disease-no diagnosis Receives prenatal care at [facility] in [place]  Onset of complaint: 0200 Pain score: 5 Vitals:   08/14/22 0453  BP: 135/80  Pulse: 82  Resp: 17  Temp: 98.2 F (36.8 C)  SpO2: 98%     FHT:137bpm Lab orders placed from triage:

## 2022-08-14 NOTE — MAU Provider Note (Signed)
History     CSN: [id number]  Arrival date and time: 08/14/22 0423   Event Date/Time   First Provider Initiated Contact with Patient 08/14/22 [phone]      Chief Complaint  Patient presents with   Abdominal Pain    Emily Golden is a 22 y.o. G4P0030 at [redacted]w[redacted]d who receives care at [facility] in [place].  She reports her next appt is Aug 22.  She presents today for epigastric pain.  She reports her pain started today around 2pm.  She states she has experienced the same pain last week. She reports the pain is constant and describes as sharp stabbing pain that radiates to her back.  She denies relieving or aggravating factors.  She rates the pain a 10/10 when it is present, but has minimal pain now and rates it a 4/10.  24 Hr  Recall:  Sandwich; [place] Swiss  Potato Salad, Pulled Pork   Funnel Cake   OB History     Gravida  4   Para  0   Term  0   Preterm  0   AB  3   Living  0      SAB  3   IAB  0   Ectopic  0   Multiple  0   Live Births  0           Past Medical History:  Diagnosis Date   Allergy    Anxiety    Asthma    Bipolar 1 disorder (HCC)     Past Surgical History:  Procedure Laterality Date   NASAL RECONSTRUCTION      Family History  Problem Relation Age of Onset   Diabetes Maternal Grandmother    Hypertension Maternal Grandmother    Hyperlipidemia Maternal Grandmother    Heart disease Maternal Grandfather    Cancer Maternal Grandfather    Diabetes Maternal Grandfather    Heart failure Neg Hx     Social History   Tobacco Use   Smoking status: Former    Current packs/day: 0.00    Types: Cigarettes    Quit date: 04/09/2018    Years since quitting: 4.3   Smokeless tobacco: Never   Tobacco comments:    Says no smoking for 2 months  Vaping Use   Vaping status: Former  Substance Use Topics   Alcohol use: Not Currently    Alcohol/week: 6.0 standard drinks of alcohol    Types: 6 Glasses of wine per week    Comment: occ    Drug use: Not Currently    Types: Marijuana    Comment: cannabis use, last use 6 months ago    Allergies:  Allergies  Allergen Reactions   Other Shortness Of Breath    Dust, Reports smells,perfumes,aerosols HX asthma    Latex Rash    Medications Prior to Admission  Medication Sig Dispense Refill Last Dose   ALBUTEROL IN Inhale 1-2 puffs into the lungs as needed.   Past Week   beclomethasone (QVAR) 80 MCG/ACT inhaler Inhale 2 puffs into the lungs daily as needed (for shortenss of breath).   08/13/2022   hydrOXYzine (VISTARIL) 25 MG capsule Take 75 mg by mouth in the morning and at bedtime.   08/13/2022   Prenatal Vit-Fe Fumarate-FA (MULTIVITAMIN-PRENATAL) 27-0.8 MG TABS tablet Take 1 tablet by mouth daily at 12 noon.   08/13/2022   sertraline (ZOLOFT) 25 MG tablet Take 25 mg by mouth daily.   08/12/2022   Blood Pressure Monitoring DEVI 1  each by Does not apply route once a week. 1 each 0     Review of Systems  Gastrointestinal:  Positive for abdominal pain (Epigastic), constipation (Last bm yesterday), nausea (Currently) and vomiting (7-8x with last occurrence 1.5hr ago). Negative for diarrhea.  Genitourinary:  Negative for difficulty urinating, dysuria, vaginal bleeding and vaginal discharge.  Neurological:  Positive for dizziness. Negative for light-headedness and headaches.   Physical Exam   Blood pressure 135/80, pulse 82, temperature 98.2 F (36.8 C), temperature source Oral, resp. rate 17, last menstrual period 03/18/2022, SpO2 98%, unknown if currently breastfeeding.  Physical Exam Vitals reviewed.  Constitutional:      General: She is not in acute distress.    Appearance: Normal appearance. She is well-developed. She is obese. She is not ill-appearing.  HENT:     Head: Normocephalic and atraumatic.  Cardiovascular:     Rate and Rhythm: Normal rate.  Pulmonary:     Effort: Pulmonary effort is normal. No respiratory distress.  Abdominal:     Palpations: Abdomen is  soft.     Tenderness: There is abdominal tenderness in the right upper quadrant and epigastric area.  Musculoskeletal:     Cervical back: Normal range of motion.  Skin:    General: Skin is warm and dry.  Neurological:     Mental Status: She is alert and oriented to person, place, and time.  Psychiatric:        Mood and Affect: Mood normal.        Behavior: Behavior normal.     MAU Course  Procedures No results found for this or any previous visit (from the past 24 hour(s)).  MDM Labs: CBC/D, CMP Ultrasound Antiemetic GI Cocktail Assessment and Plan  22 year old, G4P0030  SIUP at 21.2 weeks Epigastic Pain  -Reviewed POC with patient. -Exam performed.  -Reviewed potential differentials including gallstones, MS pain, and heartburn.  -Labs ordered -Patient offered pain medications and declines at current. -Will give zofran for nausea. -Send for ultrasound to r/o gallbladder.  -GI cocktail ordered.  -Monitor and reassess.   Cherre Robins 08/14/2022, 5:19 AM   Reassessment (6:40 AM) -Results as above. -Provider to bedside to review results. -Encouraged modifications in eating habits. -Instructed to follow up with primary office as scheduled. -Encouraged to call primary office or return to MAU if symptoms worsen or with the onset of new symptoms. -Discharged to home in stable condition.  Cherre Robins MSN, CNM Advanced Practice Provider, [facility] [employer]

## 2022-08-28 ENCOUNTER — Inpatient Hospital Stay (HOSPITAL_COMMUNITY)
Admission: AD | Admit: 2022-08-28 | Discharge: 2022-08-29 | Disposition: A | Discharge status: Home / Self Care | Payer: MEDICAID | Attending: Obstetrics & Gynecology | Admitting: Obstetrics & Gynecology

## 2022-08-28 ENCOUNTER — Inpatient Hospital Stay (HOSPITAL_COMMUNITY): Payer: MEDICAID

## 2022-08-28 ENCOUNTER — Encounter (HOSPITAL_COMMUNITY): Payer: Self-pay | Admitting: Obstetrics & Gynecology

## 2022-08-28 DIAGNOSIS — Z87891 Personal history of nicotine dependence: Secondary | ICD-10-CM | POA: Diagnosis not present

## 2022-08-28 DIAGNOSIS — O26612 Liver and biliary tract disorders in pregnancy, second trimester: Secondary | ICD-10-CM | POA: Insufficient documentation

## 2022-08-28 DIAGNOSIS — O99612 Diseases of the digestive system complicating pregnancy, second trimester: Secondary | ICD-10-CM | POA: Insufficient documentation

## 2022-08-28 DIAGNOSIS — Z3A23 23 weeks gestation of pregnancy: Secondary | ICD-10-CM | POA: Diagnosis not present

## 2022-08-28 DIAGNOSIS — K8020 Calculus of gallbladder without cholecystitis without obstruction: Secondary | ICD-10-CM | POA: Insufficient documentation

## 2022-08-28 DIAGNOSIS — Z9049 Acquired absence of other specified parts of digestive tract: Secondary | ICD-10-CM | POA: Insufficient documentation

## 2022-08-28 DIAGNOSIS — O26892 Other specified pregnancy related conditions, second trimester: Secondary | ICD-10-CM | POA: Diagnosis present

## 2022-08-28 DIAGNOSIS — R112 Nausea with vomiting, unspecified: Secondary | ICD-10-CM

## 2022-08-28 HISTORY — DX: Calculus of gallbladder without cholecystitis without obstruction: K80.20

## 2022-08-28 LAB — URINALYSIS, ROUTINE W REFLEX MICROSCOPIC
Bilirubin Urine: NEGATIVE
Glucose, UA: NEGATIVE mg/dL
Hgb urine dipstick: NEGATIVE
Ketones, ur: NEGATIVE mg/dL
Leukocytes,Ua: NEGATIVE
Nitrite: NEGATIVE
Protein, ur: NEGATIVE mg/dL
Specific Gravity, Urine: 1.012 (ref 1.005–1.030)
pH: 8 (ref 5.0–8.0)

## 2022-08-28 LAB — CBC WITH DIFFERENTIAL/PLATELET
Abs Immature Granulocytes: 0.07 10*3/uL (ref 0.00–0.07)
Basophils Absolute: 0 10*3/uL (ref 0.0–0.1)
Basophils Relative: 0 %
Eosinophils Absolute: 0 10*3/uL (ref 0.0–0.5)
Eosinophils Relative: 0 %
HCT: 39.6 % (ref 36.0–46.0)
Hemoglobin: 13.2 g/dL (ref 12.0–15.0)
Immature Granulocytes: 1 %
Lymphocytes Relative: 23 %
Lymphs Abs: 2.5 10*3/uL (ref 0.7–4.0)
MCH: 28.6 pg (ref 26.0–34.0)
MCHC: 33.3 g/dL (ref 30.0–36.0)
MCV: 85.9 fL (ref 80.0–100.0)
Monocytes Absolute: 0.5 10*3/uL (ref 0.1–1.0)
Monocytes Relative: 5 %
Neutro Abs: 7.4 10*3/uL (ref 1.7–7.7)
Neutrophils Relative %: 71 %
Platelets: 235 10*3/uL (ref 150–400)
RBC: 4.61 MIL/uL (ref 3.87–5.11)
RDW: 14.6 % (ref 11.5–15.5)
WBC: 10.5 10*3/uL (ref 4.0–10.5)
nRBC: 0 % (ref 0.0–0.2)

## 2022-08-28 MED ORDER — LACTATED RINGERS IV BOLUS
1000.0000 mL | Freq: Once | INTRAVENOUS | Status: AC
  Administered 2022-08-28: 1000 mL via INTRAVENOUS

## 2022-08-28 MED ORDER — SODIUM CHLORIDE 0.9 % IV SOLN
12.5000 mg | Freq: Once | INTRAVENOUS | Status: AC
  Administered 2022-08-28: 12.5 mg via INTRAVENOUS
  Filled 2022-08-28: qty 0.5

## 2022-08-28 MED ORDER — HYDROMORPHONE HCL 1 MG/ML IJ SOLN
0.5000 mg | Freq: Once | INTRAMUSCULAR | Status: AC
  Administered 2022-08-28: 0.5 mg via INTRAVENOUS
  Filled 2022-08-28: qty 1

## 2022-08-28 NOTE — MAU Note (Signed)
..  Emily Golden is a 22 y.o. at [redacted]w[redacted]d here in MAU reporting: nausea vomiting(more than 6 times) and upper abdominal stabbing pain that radiates to her back that began around 2 pm. Today she has eaten few fudge cookies and chicken salad with low-fat mayo.   Denies vaginal bleeding or leaking of fluid. +FM.   Reports was diagnosed with gallstones two weeks ago.   Pain score: 7/10 Vitals:   08/28/22 2036  BP: 129/82  Pulse: 94  Resp: 16  Temp: 98.3 F (36.8 C)  SpO2: 100%     FHT:143 Lab orders placed from triage:  UA

## 2022-08-28 NOTE — MAU Provider Note (Incomplete)
History     CSN: [id number]  Arrival date and time: 08/28/22 2020   Event Date/Time   First Provider Initiated Contact with Patient 08/28/22 2154      Chief Complaint  Patient presents with   Abdominal Pain   Back Pain   Nausea   Emesis   Saralee Seehafer , a  22 y.o. G4P0030 at [redacted]w[redacted]d presents to MAU with complaints of ruq pain and nausea. Patient was recently diagnosed with Cholelithiasis on 8/10 and was sent home on expectant management. She states that today alone she had 3 attacks after eating and reports that her pain at that time was a 10/10. She states that the pain is constant and starts mid sternum and wraps around to her right upper belly and feels a stabbing sensation in her back. She currently rates pain a 4/10. Diet recall today was fudge cookies and chicken salad. Patient reports she's been so nauseated that she has not felt like eating. She endorses nausea but denies vomiting. She denies other pregnancy related complaints.            OB History     Gravida  4   Para  0   Term  0   Preterm  0   AB  3   Living  0      SAB  3   IAB  0   Ectopic  0   Multiple  0   Live Births  0           Past Medical History:  Diagnosis Date   Allergy    Anxiety    Asthma    Bipolar 1 disorder (HCC)    Gallstones     Past Surgical History:  Procedure Laterality Date   compound fracture right arm Right    NASAL RECONSTRUCTION      Family History  Problem Relation Age of Onset   Diabetes Maternal Grandmother    Hypertension Maternal Grandmother    Hyperlipidemia Maternal Grandmother    Heart disease Maternal Grandfather    Cancer Maternal Grandfather    Diabetes Maternal Grandfather    Heart failure Neg Hx     Social History   Tobacco Use   Smoking status: Former    Current packs/day: 0.00    Types: Cigarettes    Quit date: 04/09/2018    Years since quitting: 4.3   Smokeless tobacco: Never   Tobacco comments:    Says no smoking for 2  months  Vaping Use   Vaping status: Former  Substance Use Topics   Alcohol use: Not Currently    Alcohol/week: 6.0 standard drinks of alcohol    Types: 6 Glasses of wine per week    Comment: occ   Drug use: Not Currently    Types: Marijuana    Comment: cannabis use, last use 6 months ago    Allergies:  Allergies  Allergen Reactions   Other Shortness Of Breath    Dust, Reports smells,perfumes,aerosols HX asthma    Latex Rash    Medications Prior to Admission  Medication Sig Dispense Refill Last Dose   Prenatal Vit-Fe Fumarate-FA (MULTIVITAMIN-PRENATAL) 27-0.8 MG TABS tablet Take 1 tablet by mouth daily at 12 noon.   08/28/2022   sertraline (ZOLOFT) 25 MG tablet Take 25 mg by mouth daily.   08/28/2022   ALBUTEROL IN Inhale 1-2 puffs into the lungs as needed.      beclomethasone (QVAR) 80 MCG/ACT inhaler Inhale 2 puffs into the lungs  daily as needed (for shortenss of breath).      Blood Pressure Monitoring DEVI 1 each by Does not apply route once a week. 1 each 0    hydrOXYzine (VISTARIL) 25 MG capsule Take 75 mg by mouth in the morning and at bedtime.       Review of Systems  Constitutional:  Positive for appetite change. Negative for chills, fatigue and fever.  Eyes:  Negative for pain and visual disturbance.  Respiratory:  Negative for apnea, shortness of breath and wheezing.   Cardiovascular:  Negative for chest pain and palpitations.  Gastrointestinal:  Positive for abdominal pain and nausea. Negative for constipation, diarrhea and vomiting.  Genitourinary:  Negative for difficulty urinating, dysuria, pelvic pain, vaginal bleeding, vaginal discharge and vaginal pain.  Musculoskeletal:  Positive for back pain.  Neurological:  Negative for seizures, weakness and headaches.  Psychiatric/Behavioral:  Negative for suicidal ideas.    Physical Exam   Blood pressure 129/82, pulse 94, temperature 98.3 F (36.8 C), temperature source Oral, resp. rate 16, height 5' 7.5" (1.715  m), weight 127.9 kg, last menstrual period 03/18/2022, SpO2 100%, unknown if currently breastfeeding.  Physical Exam Vitals and nursing note reviewed.  Constitutional:      General: She is not in acute distress.    Appearance: Normal appearance.  HENT:     Head: Normocephalic.  Pulmonary:     Effort: Pulmonary effort is normal.  Abdominal:     Palpations: Abdomen is soft.     Tenderness: There is abdominal tenderness in the right upper quadrant. There is guarding. There is no right CVA tenderness or left CVA tenderness.  Musculoskeletal:     Cervical back: Normal range of motion.  Skin:    General: Skin is warm and dry.  Neurological:     Mental Status: She is alert and oriented to person, place, and time.  Psychiatric:        Mood and Affect: Mood normal.    FHT: 135 bpm with moderate variability. (Appropriate for gestational age)  Toco: Quiet   MAU Course  Procedures Orders Placed This Encounter  Procedures   US Abdomen Limited RUQ (LIVER/GB)   Urinalysis, Routine w reflex microscopic -Urine, Clean Catch   CBC with Differential/Platelet   Insert peripheral IV   Meds ordered this encounter  Medications   lactated ringers bolus 1,000 mL   HYDROmorphone (DILAUDID) injection 0.5 mg   Results for orders placed or performed during the hospital encounter of 08/28/22 (from the past 24 hour(s))  Urinalysis, Routine w reflex microscopic -Urine, Clean Catch     Status: None   Collection Time: 08/28/22  8:42 PM  Result Value Ref Range   Color, Urine YELLOW YELLOW   APPearance CLEAR CLEAR   Specific Gravity, Urine 1.012 1.005 - 1.030   pH 8.0 5.0 - 8.0   Glucose, UA NEGATIVE NEGATIVE mg/dL   Hgb urine dipstick NEGATIVE NEGATIVE   Bilirubin Urine NEGATIVE NEGATIVE   Ketones, ur NEGATIVE NEGATIVE mg/dL   Protein, ur NEGATIVE NEGATIVE mg/dL   Nitrite NEGATIVE NEGATIVE   Leukocytes,Ua NEGATIVE NEGATIVE  CBC with Differential/Platelet     Status: None   Collection Time:  08/28/22 11:16 PM  Result Value Ref Range   WBC 10.5 4.0 - 10.5 K/uL   RBC 4.61 3.87 - 5.11 MIL/uL   Hemoglobin 13.2 12.0 - 15.0 g/dL   HCT 41.3 24.4 - 01.0 %   MCV 85.9 80.0 - 100.0 fL   MCH 28.6 26.0 -  34.0 pg   MCHC 33.3 30.0 - 36.0 g/dL   RDW 16.1 09.6 - 04.5 %   Platelets 235 150 - 400 K/uL   nRBC 0.0 0.0 - 0.2 %   Neutrophils Relative % 71 %   Neutro Abs 7.4 1.7 - 7.7 K/uL   Lymphocytes Relative 23 %   Lymphs Abs 2.5 0.7 - 4.0 K/uL   Monocytes Relative 5 %   Monocytes Absolute 0.5 0.1 - 1.0 K/uL   Eosinophils Relative 0 %   Eosinophils Absolute 0.0 0.0 - 0.5 K/uL   Basophils Relative 0 %   Basophils Absolute 0.0 0.0 - 0.1 K/uL   Immature Granulocytes 1 %   Abs Immature Granulocytes 0.07 0.00 - 0.07 K/uL  Comprehensive metabolic panel     Status: Abnormal   Collection Time: 08/28/22 11:16 PM  Result Value Ref Range   Sodium 139 135 - 145 mmol/L   Potassium 4.7 3.5 - 5.1 mmol/L   Chloride 107 98 - 111 mmol/L   CO2 19 (L) 22 - 32 mmol/L   Glucose, Bld 79 70 - 99 mg/dL   BUN <5 (L) 6 - 20 mg/dL   Creatinine, Ser 4.09 0.44 - 1.00 mg/dL   Calcium 9.4 8.9 - 81.1 mg/dL   Total Protein 6.6 6.5 - 8.1 g/dL   Albumin 3.1 (L) 3.5 - 5.0 g/dL   AST 33 15 - 41 U/L   ALT 30 0 - 44 U/L   Alkaline Phosphatase 83 38 - 126 U/L   Total Bilirubin 0.5 0.3 - 1.2 mg/dL   GFR, Estimated >91 >47 mL/min   Anion gap 13 5 - 15     MDM - Consulted Dr. Charlotta Newton on plan of care to repeat US given recently dx'd on 8/11. Per MD repeat scans and lab work. Orders placed.  - Labs stable.  - [place] results very similar to previous scan. No signs of worsening Cholelithiasis.  - Nausea improved with pain meds and antiemetics.  - Plan for discharge.   Assessment and Plan   1. Cholelithiasis affecting pregnancy in second trimester, antepartum   2. Nausea and vomiting, unspecified vomiting type   3. [redacted] weeks gestation of pregnancy    - Reviewed expectations of Cholelithiasis, and encouraged the use of a  low fat diet to decrease attacks.  - Rx for Phenergan and pain management sent to patient pharmacy for pick up.  - Also encouraged patient to see if Her OB in [place] will get her a referral for general surgery.  - OP consult for Gen Surge placed today, reviewed that it may take a week or two to hear anything,  - FHT appropriate for gestational age at time of discharge.  - Patient discharged home in stable condition and may return to MAU as needed.   Claudette Head, MSN CNM  08/28/2022, 9:54 PM

## 2022-08-29 DIAGNOSIS — O26612 Liver and biliary tract disorders in pregnancy, second trimester: Secondary | ICD-10-CM

## 2022-08-29 DIAGNOSIS — K8020 Calculus of gallbladder without cholecystitis without obstruction: Secondary | ICD-10-CM

## 2022-08-29 DIAGNOSIS — R112 Nausea with vomiting, unspecified: Secondary | ICD-10-CM | POA: Diagnosis not present

## 2022-08-29 DIAGNOSIS — O99612 Diseases of the digestive system complicating pregnancy, second trimester: Secondary | ICD-10-CM | POA: Diagnosis not present

## 2022-08-29 DIAGNOSIS — Z3A23 23 weeks gestation of pregnancy: Secondary | ICD-10-CM

## 2022-08-29 LAB — COMPREHENSIVE METABOLIC PANEL
ALT: 30 U/L (ref 0–44)
AST: 33 U/L (ref 15–41)
Albumin: 3.1 g/dL — ABNORMAL LOW (ref 3.5–5.0)
Alkaline Phosphatase: 83 U/L (ref 38–126)
Anion gap: 13 (ref 5–15)
BUN: <5 mg/dL — ABNORMAL LOW (ref 6–20)
CO2: 19 mmol/L — ABNORMAL LOW (ref 22–32)
Calcium: 9.4 mg/dL (ref 8.9–10.3)
Chloride: 107 mmol/L (ref 98–111)
Creatinine, Ser: 0.59 mg/dL (ref 0.44–1.00)
GFR, Estimated: >60 mL/min (ref >60)
Glucose, Bld: 79 mg/dL (ref 70–99)
Potassium: 4.7 mmol/L (ref 3.5–5.1)
Sodium: 139 mmol/L (ref 135–145)
Total Bilirubin: 0.5 mg/dL (ref 0.3–1.2)
Total Protein: 6.6 g/dL (ref 6.5–8.1)

## 2022-08-29 MED ORDER — HYDROCODONE-ACETAMINOPHEN 5-325 MG PO TABS: 2.0000 | ORAL_TABLET | ORAL | 0 refills | Status: DC | PRN

## 2022-08-29 MED ORDER — PROMETHAZINE HCL 12.5 MG PO TABS: 12.5000 mg | ORAL_TABLET | Freq: Four times a day (QID) | ORAL | 0 refills | Status: DC | PRN

## 2022-08-29 MED ORDER — HYDROMORPHONE HCL 1 MG/ML IJ SOLN
1.0000 mg | Freq: Once | INTRAMUSCULAR | Status: DC
Start: 2022-08-29 — End: 2022-08-29

## 2022-08-29 MED ORDER — SCOPOLAMINE 1 MG/3DAYS TD PT72
1.0000 | MEDICATED_PATCH | Freq: Once | TRANSDERMAL | Status: DC
  Administered 2022-08-29: 1.5 mg via TRANSDERMAL
  Filled 2022-08-29: qty 1

## 2022-08-29 MED ORDER — LIDOCAINE VISCOUS HCL 2 % MT SOLN
15.0000 mL | Freq: Once | OROMUCOSAL | Status: AC
  Administered 2022-08-29: 15 mL via ORAL
  Filled 2022-08-29: qty 15

## 2022-08-29 MED ORDER — ONDANSETRON HCL 4 MG/2ML IJ SOLN
4.0000 mg | Freq: Once | INTRAMUSCULAR | Status: AC
  Administered 2022-08-29: 4 mg via INTRAVENOUS
  Filled 2022-08-29: qty 2

## 2022-08-29 MED ORDER — ALUM & MAG HYDROXIDE-SIMETH 200-200-20 MG/5ML PO SUSP
30.0000 mL | Freq: Once | ORAL | Status: AC
  Administered 2022-08-29: 30 mL via ORAL
  Filled 2022-08-29: qty 30

## 2022-08-31 ENCOUNTER — Ambulatory Visit (INDEPENDENT_AMBULATORY_CARE_PROVIDER_SITE_OTHER): Payer: MEDICAID | Admitting: Surgery

## 2022-08-31 ENCOUNTER — Encounter: Payer: Self-pay | Admitting: Surgery

## 2022-08-31 VITALS — BP 122/71 | HR 128 | Temp 98.6°F | Wt 278.4 lb

## 2022-08-31 DIAGNOSIS — K8020 Calculus of gallbladder without cholecystitis without obstruction: Secondary | ICD-10-CM

## 2022-08-31 NOTE — H&P (View-Only) (Signed)
 08/31/2022  Reason for Visit:  Symptomatic cholelithiasis  Requesting Provider:  Sandra Cockayne, CNM  History of Present Illness: Emily Golden is a 22 y.o. female presenting for evaluation of symptomatic cholelithiasis.  She is currently pregnant and almost at 49 weeks of gestational age.  She reports that she's been to the ER twice this month and was diagnosed with cholelithiasis.  She has been advised of low fat diet and she's been doing this, but she continues having episodes of biliary colic, a few times per week and sometimes multiple times per day.  Each episode has symptoms of nausea, vomiting, and pain in the epigastric area that radiates to her right upper quadrant and wraps around to her back.  Denies any fevers or chills.  The episodes subside on their own.    Her two visits to the ER were this month.  She has had two ultrasounds which showed multiple gallstones without inflammatory changes.  Her LFTs have been normal and her WBC has been normal.  Past Medical History: Past Medical History:  Diagnosis Date   Allergy    Anxiety    Asthma    Bipolar 1 disorder (HCC)    Gallstones      Past Surgical History: Past Surgical History:  Procedure Laterality Date   compound fracture right arm Right    NASAL RECONSTRUCTION      Home Medications: Prior to Admission medications   Medication Sig Start Date End Date Taking? Authorizing Provider  ALBUTEROL IN Inhale 1-2 puffs into the lungs as needed.   Yes [provider]  beclomethasone (QVAR) 80 MCG/ACT inhaler Inhale 2 puffs into the lungs daily as needed (for shortenss of breath).   Yes [provider]  Blood Pressure Monitoring DEVI 1 each by Does not apply route once a week. 05/19/22  Yes Constant, Peggy, MD  HYDROcodone-acetaminophen (NORCO/VICODIN) 5-325 MG tablet Take 2 tablets by mouth every 4 (four) hours as needed for severe pain. 08/29/22  Yes Sandra Cockayne M, CNM  hydrOXYzine (VISTARIL) 25 MG  capsule Take 75 mg by mouth in the morning and at bedtime. 10/14/21  Yes [provider]  Prenatal Vit-Fe Fumarate-FA (MULTIVITAMIN-PRENATAL) 27-0.8 MG TABS tablet Take 1 tablet by mouth daily at 12 noon.   Yes [provider]  promethazine (PHENERGAN) 12.5 MG tablet Take 1 tablet (12.5 mg total) by mouth every 6 (six) hours as needed for nausea or vomiting. 08/29/22  Yes Sandra Cockayne M, CNM  sertraline (ZOLOFT) 25 MG tablet Take 25 mg by mouth daily.   Yes [provider]    Allergies: Allergies  Allergen Reactions   Other Shortness Of Breath    Dust, Reports smells,perfumes,aerosols HX asthma    Latex Rash    Social History:  reports that she quit smoking about 4 years ago. Her smoking use included cigarettes. She has never used smokeless tobacco. She reports that she does not currently use alcohol after a past usage of about 6.0 standard drinks of alcohol per week. She reports that she does not currently use drugs after having used the following drugs: Marijuana.   Family History: Family History  Problem Relation Age of Onset   Diabetes Maternal Grandmother    Hypertension Maternal Grandmother    Hyperlipidemia Maternal Grandmother    Heart disease Maternal Grandfather    Cancer Maternal Grandfather    Diabetes Maternal Grandfather    Heart failure Neg Hx     Review of Systems: Review of Systems  Constitutional:  Negative for chills and fever.  HENT:  Negative for hearing loss.   Respiratory:  Negative for shortness of breath.   Cardiovascular:  Negative for chest pain.  Gastrointestinal:  Positive for abdominal pain, nausea and vomiting.  Genitourinary:  Negative for dysuria.  Musculoskeletal:  Positive for back pain. Negative for myalgias.  Skin:  Negative for rash.  Neurological:  Negative for dizziness.  Psychiatric/Behavioral:  Negative for depression.     Physical Exam BP 122/71   Pulse (!) 128   Temp 98.6 F (37 C) (Oral)    Wt 278 lb 6.4 oz (126.3 kg)   LMP 03/18/2022   SpO2 98%   BMI 42.96 kg/m  CONSTITUTIONAL: No acute distress HEENT:  Normocephalic, atraumatic, extraocular motion intact. NECK: Trachea is midline, and there is no jugular venous distension.  RESPIRATORY:  Lungs are clear, and breath sounds are equal bilaterally. Normal respiratory effort without pathologic use of accessory muscles. CARDIOVASCULAR: Heart is regular without murmurs, gallops, or rubs. GI: The abdomen is soft, nondistended, with mild tenderness to palpation in the epigastric and right upper quadrant areas.  Negative Murphy's sign.  Gravid uterus with fundal height just above the umbilicus.  MUSCULOSKELETAL:  Normal muscle strength and tone in all four extremities.  No peripheral edema or cyanosis. SKIN: Skin turgor is normal. There are no pathologic skin lesions.  NEUROLOGIC:  Motor and sensation is grossly normal.  Cranial nerves are grossly intact. PSYCH:  Alert and oriented to person, place and time. Affect is normal.  Laboratory Analysis: Labs from 08/28/22: Sodium 139, potassium 4.7, chloride 107, CO2 19, BUN less than 5, creatinine 0.59.  Total bilirubin 0.5, AST 33, ALT 30, alkaline phosphatase 83.  WBC 10.5, hemoglobin 13.2, hematocrit 39.6, platelets 235.  Imaging: Ultrasound RUQ on 08/28/22: IMPRESSION: Moderate right sided hydronephrosis likely related to compression from the gravid uterus.   Cholelithiasis without wall thickening or pericholecystic fluid. A positive sonographic Murphy's sign is elicited although this may be related to the underlying hydronephrosis.  Assessment and Plan: This is a 22 y.o. female with symptomatic cholelithiasis  --Discussed with the patient the findings on her ultrasound and lab studies.  Overall she has cholelithiasis but neither ultrasound showed inflammatory changes to suggest cholecystitis.  Discussed with her that the typical management is surgical in the form of  cholecystectomy.  Being pregnant does carry the risk of either miscarriage in first trimester or preterm labor in 2nd or 3rd trimester.  However, doing cholecystectomy during pregnancy has been deemed safe in recent studies.  Discussed the possible options for watchful waiting and trialing a course of ursodiol and strict low fat diet.  This would still put her at risk for cholecystitis.  Discussed the option for cholecystectomy with possible preterm labor risk.  Given the frequency and severity of her symptoms, she has opted for cholecystectomy.  Discussed with her that given her gestational age and fundal height, I would like to do surgery sooner in order to give [place] as much space possible to work during surgery.  She's in agreement. --Discussed with her then the plan for a robotic assisted cholecystectomy.  Reviewed the surgery at length with her including the planned incisions, the risks of bleeding, infection, injury to surrounding structures, injury to fetus, the use of ICG to better evaluate her biliary anatomy, that this would be an outpatient surgery, post-operative activity restrictions, pain control, and she's willing to proceed. --Will schedule her for surgery on 09/02/22.  Will contact OB/GYN team to determine  appropriate steps for the patient on the day of surgery such as obtaining fetal heart tones preop/postop vs monitoring intra-op.  I spent 55 minutes dedicated to the care of this patient on the date of this encounter to include pre-visit review of records, face-to-face time with the patient discussing diagnosis and management, and any post-visit coordination of care.   Howie Ill, MD [facility]

## 2022-08-31 NOTE — Patient Instructions (Signed)
You have requested to have your gallbladder removed. This will be done at [facility] with Dr. Leland Johns.  You will most likely be out of work 1-2 weeks for this surgery.  If you have FMLA or disability paperwork that needs filled out you may drop this off at our office or this can be faxed to [phone].  You will return after your post-op appointment with a lifting restriction for approximately 4 more weeks.  You will be able to eat anything you would like to following surgery. But, start by eating a bland diet and advance this as tolerated. The Gallbladder diet is below, please go as closely by this diet as possible prior to surgery to avoid any further attacks.  Please see the (blue)pre-care form that you have been given today. Our surgery scheduler will call you to verify surgery date and to go over information.   If you have any questions, please call our office.  Laparoscopic Cholecystectomy Laparoscopic cholecystectomy is surgery to remove the gallbladder. The gallbladder is located in the upper right part of the abdomen, behind the liver. It is a storage sac for bile, which is produced in the liver. Bile aids in the digestion and absorption of fats. Cholecystectomy is often done for inflammation of the gallbladder (cholecystitis). This condition is usually caused by a buildup of gallstones (cholelithiasis) in the gallbladder. Gallstones can block the flow of bile, and that can result in inflammation and pain. In severe cases, emergency surgery may be required. If emergency surgery is not required, you will have time to prepare for the procedure. Laparoscopic surgery is an alternative to open surgery. Laparoscopic surgery has a shorter recovery time. Your common bile duct may also need to be examined during the procedure. If stones are found in the common bile duct, they may be removed. LET [facility] CARE PROVIDER KNOW ABOUT: Any allergies you have. All medicines you are taking,  including vitamins, herbs, eye drops, creams, and over-the-counter medicines. Previous problems you or members of your family have had with the use of anesthetics. Any blood disorders you have. Previous surgeries you have had.  Any medical conditions you have. RISKS AND COMPLICATIONS Generally, this is a safe procedure. However, problems may occur, including: Infection. Bleeding. Allergic reactions to medicines. Damage to other structures or organs. A stone remaining in the common bile duct. A bile leak from the cyst duct that is clipped when your gallbladder is removed. The need to convert to open surgery, which requires a larger incision in the abdomen. This may be necessary if your surgeon thinks that it is not safe to continue with a laparoscopic procedure. BEFORE THE PROCEDURE Ask your health care provider about: Changing or stopping your regular medicines. This is especially important if you are taking diabetes medicines or blood thinners. Taking medicines such as aspirin and ibuprofen. These medicines can thin your blood. Do not take these medicines before your procedure if your health care provider instructs you not to. Follow instructions from your health care provider about eating or drinking restrictions. Let your health care provider know if you develop a cold or an infection before surgery. Plan to have someone take you home after the procedure. Ask your health care provider how your surgical site will be marked or identified. You may be given antibiotic medicine to help prevent infection. PROCEDURE To reduce your risk of infection: Your health care team will wash or sanitize their hands. Your skin will be washed with soap. An IV  tube may be inserted into one of your veins. You will be given a medicine to make you fall asleep (general anesthetic). A breathing tube will be placed in your mouth. The surgeon will make several small cuts (incisions) in your abdomen. A thin,  lighted tube (laparoscope) that has a tiny camera on the end will be inserted through one of the small incisions. The camera on the laparoscope will send a picture to a TV screen (monitor) in the operating room. This will give the surgeon a good view inside your abdomen. A gas will be pumped into your abdomen. This will expand your abdomen to give the surgeon more room to perform the surgery. Other tools that are needed for the procedure will be inserted through the other incisions. The gallbladder will be removed through one of the incisions. After your gallbladder has been removed, the incisions will be closed with stitches (sutures), staples, or skin glue. Your incisions may be covered with a bandage (dressing). The procedure may vary among health care providers and hospitals. AFTER THE PROCEDURE Your blood pressure, heart rate, breathing rate, and blood oxygen level will be monitored often until the medicines you were given have worn off. You will be given medicines as needed to control your pain.   This information is not intended to replace advice given to you by your health care provider. Make sure you discuss any questions you have with your health care provider.   Document Released: 12/20/2004 Document Revised: 09/10/2014 Document Reviewed: 08/01/2012 Elsevier Interactive Patient Education 2016 Elsevier Inc.   Low-Fat Diet for Gallbladder Conditions A low-fat diet can be helpful if you have pancreatitis or a gallbladder condition. With these conditions, your pancreas and gallbladder have trouble digesting fats. A healthy eating plan with less fat will help rest your pancreas and gallbladder and reduce your symptoms. WHAT DO I NEED TO KNOW ABOUT THIS DIET? Eat a low-fat diet. Reduce your fat intake to less than 20-30% of your total daily calories. This is less than 50-60 g of fat per day. Remember that you need some fat in your diet. Ask your dietician what your daily goal should  be. Choose nonfat and low-fat healthy foods. Look for the words "nonfat," "low fat," or "fat free." As a guide, look on the label and choose foods with less than 3 g of fat per serving. Eat only one serving. Avoid alcohol. Do not smoke. If you need help quitting, talk with your health care provider. Eat small frequent meals instead of three large heavy meals. WHAT FOODS CAN I EAT? Grains Include healthy grains and starches such as potatoes, wheat bread, fiber-rich cereal, and brown rice. Choose whole grain options whenever possible. In adults, whole grains should account for 45-65% of your daily calories.  Fruits and Vegetables Eat plenty of fruits and vegetables. Fresh fruits and vegetables add fiber to your diet. Meats and Other Protein Sources Eat lean meat such as chicken and pork. Trim any fat off of meat before cooking it. Eggs, fish, and beans are other sources of protein. In adults, these foods should account for 10-35% of your daily calories. Dairy Choose low-fat milk and dairy options. Dairy includes fat and protein, as well as calcium.  Fats and Oils Limit high-fat foods such as fried foods, sweets, baked goods, sugary drinks.  Other Creamy sauces and condiments, such as mayonnaise, can add extra fat. Think about whether or not you need to use them, or use smaller amounts or low fat options.  WHAT FOODS ARE NOT RECOMMENDED? High fat foods, such as: [employer]. Ice cream. [place] toast. Sweet rolls. Pizza. Cheese bread. Foods covered with batter, butter, creamy sauces, or cheese. Fried foods. Sugary drinks and desserts. Foods that cause gas or bloating   This information is not intended to replace advice given to you by your health care provider. Make sure you discuss any questions you have with your health care provider.   Document Released: 12/25/2012 Document Reviewed: 12/25/2012 Elsevier Interactive Patient Education [employer]

## 2022-08-31 NOTE — Progress Notes (Signed)
08/31/2022  Reason for Visit:  Symptomatic cholelithiasis  Requesting Provider:  Sandra Cockayne, CNM  History of Present Illness: Emily Golden is a 22 y.o. female presenting for evaluation of symptomatic cholelithiasis.  She is currently pregnant and almost at 49 weeks of gestational age.  She reports that she's been to the ER twice this month and was diagnosed with cholelithiasis.  She has been advised of low fat diet and she's been doing this, but she continues having episodes of biliary colic, a few times per week and sometimes multiple times per day.  Each episode has symptoms of nausea, vomiting, and pain in the epigastric area that radiates to her right upper quadrant and wraps around to her back.  Denies any fevers or chills.  The episodes subside on their own.    Her two visits to the ER were this month.  She has had two ultrasounds which showed multiple gallstones without inflammatory changes.  Her LFTs have been normal and her WBC has been normal.  Past Medical History: Past Medical History:  Diagnosis Date   Allergy    Anxiety    Asthma    Bipolar 1 disorder (HCC)    Gallstones      Past Surgical History: Past Surgical History:  Procedure Laterality Date   compound fracture right arm Right    NASAL RECONSTRUCTION      Home Medications: Prior to Admission medications   Medication Sig Start Date End Date Taking? Authorizing Provider  ALBUTEROL IN Inhale 1-2 puffs into the lungs as needed.   Yes [provider]  beclomethasone (QVAR) 80 MCG/ACT inhaler Inhale 2 puffs into the lungs daily as needed (for shortenss of breath).   Yes [provider]  Blood Pressure Monitoring DEVI 1 each by Does not apply route once a week. 05/19/22  Yes Constant, Peggy, MD  HYDROcodone-acetaminophen (NORCO/VICODIN) 5-325 MG tablet Take 2 tablets by mouth every 4 (four) hours as needed for severe pain. 08/29/22  Yes Sandra Cockayne M, CNM  hydrOXYzine (VISTARIL) 25 MG  capsule Take 75 mg by mouth in the morning and at bedtime. 10/14/21  Yes [provider]  Prenatal Vit-Fe Fumarate-FA (MULTIVITAMIN-PRENATAL) 27-0.8 MG TABS tablet Take 1 tablet by mouth daily at 12 noon.   Yes [provider]  promethazine (PHENERGAN) 12.5 MG tablet Take 1 tablet (12.5 mg total) by mouth every 6 (six) hours as needed for nausea or vomiting. 08/29/22  Yes Sandra Cockayne M, CNM  sertraline (ZOLOFT) 25 MG tablet Take 25 mg by mouth daily.   Yes [provider]    Allergies: Allergies  Allergen Reactions   Other Shortness Of Breath    Dust, Reports smells,perfumes,aerosols HX asthma    Latex Rash    Social History:  reports that she quit smoking about 4 years ago. Her smoking use included cigarettes. She has never used smokeless tobacco. She reports that she does not currently use alcohol after a past usage of about 6.0 standard drinks of alcohol per week. She reports that she does not currently use drugs after having used the following drugs: Marijuana.   Family History: Family History  Problem Relation Age of Onset   Diabetes Maternal Grandmother    Hypertension Maternal Grandmother    Hyperlipidemia Maternal Grandmother    Heart disease Maternal Grandfather    Cancer Maternal Grandfather    Diabetes Maternal Grandfather    Heart failure Neg Hx     Review of Systems: Review of Systems  Constitutional:  Negative for chills and fever.  HENT:  Negative for hearing loss.   Respiratory:  Negative for shortness of breath.   Cardiovascular:  Negative for chest pain.  Gastrointestinal:  Positive for abdominal pain, nausea and vomiting.  Genitourinary:  Negative for dysuria.  Musculoskeletal:  Positive for back pain. Negative for myalgias.  Skin:  Negative for rash.  Neurological:  Negative for dizziness.  Psychiatric/Behavioral:  Negative for depression.     Physical Exam BP 122/71   Pulse (!) 128   Temp 98.6 F (37 C) (Oral)    Wt 278 lb 6.4 oz (126.3 kg)   LMP 03/18/2022   SpO2 98%   BMI 42.96 kg/m  CONSTITUTIONAL: No acute distress HEENT:  Normocephalic, atraumatic, extraocular motion intact. NECK: Trachea is midline, and there is no jugular venous distension.  RESPIRATORY:  Lungs are clear, and breath sounds are equal bilaterally. Normal respiratory effort without pathologic use of accessory muscles. CARDIOVASCULAR: Heart is regular without murmurs, gallops, or rubs. GI: The abdomen is soft, nondistended, with mild tenderness to palpation in the epigastric and right upper quadrant areas.  Negative Murphy's sign.  Gravid uterus with fundal height just above the umbilicus.  MUSCULOSKELETAL:  Normal muscle strength and tone in all four extremities.  No peripheral edema or cyanosis. SKIN: Skin turgor is normal. There are no pathologic skin lesions.  NEUROLOGIC:  Motor and sensation is grossly normal.  Cranial nerves are grossly intact. PSYCH:  Alert and oriented to person, place and time. Affect is normal.  Laboratory Analysis: Labs from 08/28/22: Sodium 139, potassium 4.7, chloride 107, CO2 19, BUN less than 5, creatinine 0.59.  Total bilirubin 0.5, AST 33, ALT 30, alkaline phosphatase 83.  WBC 10.5, hemoglobin 13.2, hematocrit 39.6, platelets 235.  Imaging: Ultrasound RUQ on 08/28/22: IMPRESSION: Moderate right sided hydronephrosis likely related to compression from the gravid uterus.   Cholelithiasis without wall thickening or pericholecystic fluid. A positive sonographic Murphy's sign is elicited although this may be related to the underlying hydronephrosis.  Assessment and Plan: This is a 22 y.o. female with symptomatic cholelithiasis  --Discussed with the patient the findings on her ultrasound and lab studies.  Overall she has cholelithiasis but neither ultrasound showed inflammatory changes to suggest cholecystitis.  Discussed with her that the typical management is surgical in the form of  cholecystectomy.  Being pregnant does carry the risk of either miscarriage in first trimester or preterm labor in 2nd or 3rd trimester.  However, doing cholecystectomy during pregnancy has been deemed safe in recent studies.  Discussed the possible options for watchful waiting and trialing a course of ursodiol and strict low fat diet.  This would still put her at risk for cholecystitis.  Discussed the option for cholecystectomy with possible preterm labor risk.  Given the frequency and severity of her symptoms, she has opted for cholecystectomy.  Discussed with her that given her gestational age and fundal height, I would like to do surgery sooner in order to give [place] as much space possible to work during surgery.  She's in agreement. --Discussed with her then the plan for a robotic assisted cholecystectomy.  Reviewed the surgery at length with her including the planned incisions, the risks of bleeding, infection, injury to surrounding structures, injury to fetus, the use of ICG to better evaluate her biliary anatomy, that this would be an outpatient surgery, post-operative activity restrictions, pain control, and she's willing to proceed. --Will schedule her for surgery on 09/02/22.  Will contact OB/GYN team to determine  appropriate steps for the patient on the day of surgery such as obtaining fetal heart tones preop/postop vs monitoring intra-op.  I spent 55 minutes dedicated to the care of this patient on the date of this encounter to include pre-visit review of records, face-to-face time with the patient discussing diagnosis and management, and any post-visit coordination of care.   Howie Ill, MD [facility]

## 2022-09-01 ENCOUNTER — Telehealth: Payer: Self-pay | Admitting: Surgery

## 2022-09-01 MED ORDER — CHLORHEXIDINE GLUCONATE CLOTH 2 % EX PADS: 6.0000 | MEDICATED_PAD | Freq: Once | CUTANEOUS | Status: DC

## 2022-09-01 MED ORDER — INDOCYANINE GREEN 25 MG IV SOLR
2.5000 mg | INTRAVENOUS | Status: AC
  Administered 2022-09-02: 2.5 mg via INTRAVENOUS

## 2022-09-01 MED ORDER — BUPIVACAINE LIPOSOME 1.3 % IJ SUSP: 20.0000 mL | Freq: Once | INTRAMUSCULAR | Status: DC

## 2022-09-01 MED ORDER — CEFAZOLIN IN SODIUM CHLORIDE 3-0.9 GM/100ML-% IV SOLN
3.0000 g | INTRAVENOUS | Status: AC
  Administered 2022-09-02: 3 g via INTRAVENOUS
  Filled 2022-09-01 (×2): qty 100

## 2022-09-01 MED ORDER — ACETAMINOPHEN 500 MG PO TABS
1000.0000 mg | ORAL_TABLET | ORAL | Status: AC
  Administered 2022-09-02: 1000 mg via ORAL

## 2022-09-01 NOTE — Telephone Encounter (Signed)
Patient has been advised of Pre-Admission date/time, and Surgery date at [facility].  Surgery Date: 09/02/22 Preadmission Testing Date: 8/30/ (patient to arrive 2 hrs early)  Patient has been made aware to call [phone], between 1-3:00pm the day before surgery, to find out what time to arrive for surgery.

## 2022-09-02 ENCOUNTER — Ambulatory Visit: Payer: MEDICAID | Admitting: Anesthesiology

## 2022-09-02 ENCOUNTER — Other Ambulatory Visit: Payer: Self-pay

## 2022-09-02 ENCOUNTER — Encounter
Admission: RE | Disposition: A | Discharge status: Home / Self Care | Payer: Self-pay | Source: Ambulatory Visit | Attending: Surgery

## 2022-09-02 ENCOUNTER — Ambulatory Visit
Admission: RE | Admit: 2022-09-02 | Discharge: 2022-09-02 | Disposition: A | Discharge status: Home / Self Care | Payer: MEDICAID | Source: Ambulatory Visit | Attending: Surgery | Admitting: Surgery

## 2022-09-02 ENCOUNTER — Encounter: Payer: Self-pay | Admitting: Surgery

## 2022-09-02 DIAGNOSIS — O99512 Diseases of the respiratory system complicating pregnancy, second trimester: Secondary | ICD-10-CM | POA: Diagnosis not present

## 2022-09-02 DIAGNOSIS — J45909 Unspecified asthma, uncomplicated: Secondary | ICD-10-CM | POA: Diagnosis not present

## 2022-09-02 DIAGNOSIS — Z3A24 24 weeks gestation of pregnancy: Secondary | ICD-10-CM | POA: Insufficient documentation

## 2022-09-02 DIAGNOSIS — F319 Bipolar disorder, unspecified: Secondary | ICD-10-CM | POA: Diagnosis not present

## 2022-09-02 DIAGNOSIS — O99342 Other mental disorders complicating pregnancy, second trimester: Secondary | ICD-10-CM | POA: Insufficient documentation

## 2022-09-02 DIAGNOSIS — K8020 Calculus of gallbladder without cholecystitis without obstruction: Secondary | ICD-10-CM | POA: Diagnosis present

## 2022-09-02 DIAGNOSIS — Z87891 Personal history of nicotine dependence: Secondary | ICD-10-CM | POA: Insufficient documentation

## 2022-09-02 DIAGNOSIS — Z7951 Long term (current) use of inhaled steroids: Secondary | ICD-10-CM | POA: Insufficient documentation

## 2022-09-02 DIAGNOSIS — O99612 Diseases of the digestive system complicating pregnancy, second trimester: Secondary | ICD-10-CM | POA: Insufficient documentation

## 2022-09-02 DIAGNOSIS — Z9049 Acquired absence of other specified parts of digestive tract: Secondary | ICD-10-CM | POA: Diagnosis present

## 2022-09-02 SURGERY — CHOLECYSTECTOMY, ROBOT-ASSISTED, LAPAROSCOPIC
Anesthesia: General

## 2022-09-02 MED ORDER — SUCCINYLCHOLINE CHLORIDE 200 MG/10ML IV SOSY
PREFILLED_SYRINGE | INTRAVENOUS | Status: DC | PRN
  Administered 2022-09-02: 140 mg via INTRAVENOUS

## 2022-09-02 MED ORDER — BUPIVACAINE-EPINEPHRINE (PF) 0.25% -1:200000 IJ SOLN
INTRAMUSCULAR | Status: AC
  Filled 2022-09-02: qty 30

## 2022-09-02 MED ORDER — LACTATED RINGERS IV SOLN: INTRAVENOUS | Status: DC | PRN

## 2022-09-02 MED ORDER — DEXAMETHASONE SODIUM PHOSPHATE 10 MG/ML IJ SOLN
INTRAMUSCULAR | Status: DC | PRN
  Administered 2022-09-02: 10 mg via INTRAVENOUS

## 2022-09-02 MED ORDER — BUPIVACAINE-EPINEPHRINE (PF) 0.25% -1:200000 IJ SOLN
INTRAMUSCULAR | Status: DC | PRN
  Administered 2022-09-02: 30 mL

## 2022-09-02 MED ORDER — ONDANSETRON HCL 4 MG/2ML IJ SOLN
INTRAMUSCULAR | Status: DC | PRN
  Administered 2022-09-02: 4 mg via INTRAVENOUS

## 2022-09-02 MED ORDER — FENTANYL CITRATE (PF) 100 MCG/2ML IJ SOLN
INTRAMUSCULAR | Status: AC
  Filled 2022-09-02: qty 2

## 2022-09-02 MED ORDER — OXYCODONE HCL 5 MG/5ML PO SOLN: 5.0000 mg | Freq: Once | ORAL | Status: DC | PRN

## 2022-09-02 MED ORDER — ACETAMINOPHEN 10 MG/ML IV SOLN: 1000.0000 mg | Freq: Once | INTRAVENOUS | Status: DC | PRN

## 2022-09-02 MED ORDER — FENTANYL CITRATE (PF) 100 MCG/2ML IJ SOLN
INTRAMUSCULAR | Status: DC | PRN
  Administered 2022-09-02: 50 ug via INTRAVENOUS
  Administered 2022-09-02: 25 ug via INTRAVENOUS
  Administered 2022-09-02: 50 ug via INTRAVENOUS
  Administered 2022-09-02: 75 ug via INTRAVENOUS

## 2022-09-02 MED ORDER — OXYCODONE HCL 5 MG PO TABS: 5.0000 mg | ORAL_TABLET | Freq: Once | ORAL | Status: DC | PRN

## 2022-09-02 MED ORDER — LIDOCAINE HCL (CARDIAC) PF 100 MG/5ML IV SOSY
PREFILLED_SYRINGE | INTRAVENOUS | Status: DC | PRN
  Administered 2022-09-02: 100 mg via INTRAVENOUS

## 2022-09-02 MED ORDER — PROMETHAZINE HCL 25 MG/ML IJ SOLN: 6.2500 mg | INTRAMUSCULAR | Status: DC | PRN

## 2022-09-02 MED ORDER — PROPOFOL 10 MG/ML IV BOLUS
INTRAVENOUS | Status: AC
  Filled 2022-09-02: qty 20

## 2022-09-02 MED ORDER — ATROPINE SULFATE 0.4 MG/ML IV SOLN
INTRAVENOUS | Status: DC | PRN
Start: 2022-09-02 — End: 2022-09-02
  Administered 2022-09-02: .8 mg via INTRAVENOUS

## 2022-09-02 MED ORDER — DROPERIDOL 2.5 MG/ML IJ SOLN
INTRAMUSCULAR | Status: AC
  Filled 2022-09-02: qty 2

## 2022-09-02 MED ORDER — ROCURONIUM BROMIDE 100 MG/10ML IV SOLN
INTRAVENOUS | Status: DC | PRN
  Administered 2022-09-02: 40 mg via INTRAVENOUS
  Administered 2022-09-02: 20 mg via INTRAVENOUS
  Administered 2022-09-02: 10 mg via INTRAVENOUS

## 2022-09-02 MED ORDER — OXYCODONE HCL 5 MG PO TABS: 5.0000 mg | ORAL_TABLET | ORAL | 0 refills | Status: DC | PRN

## 2022-09-02 MED ORDER — NEOSTIGMINE METHYLSULFATE 10 MG/10ML IV SOLN
INTRAVENOUS | Status: DC | PRN
  Administered 2022-09-02: 3 mg via INTRAVENOUS

## 2022-09-02 MED ORDER — PROPOFOL 10 MG/ML IV BOLUS
INTRAVENOUS | Status: DC | PRN
  Administered 2022-09-02: 150 mg via INTRAVENOUS

## 2022-09-02 MED ORDER — BUPIVACAINE LIPOSOME 1.3 % IJ SUSP
INTRAMUSCULAR | Status: DC | PRN
Start: 2022-09-02 — End: 2022-09-02
  Administered 2022-09-02: 10 mL

## 2022-09-02 MED ORDER — ACETAMINOPHEN 500 MG PO TABS
ORAL_TABLET | ORAL | Status: AC
  Filled 2022-09-02: qty 2

## 2022-09-02 MED ORDER — INDOCYANINE GREEN 25 MG IV SOLR
INTRAVENOUS | Status: AC
  Filled 2022-09-02: qty 10

## 2022-09-02 MED ORDER — OXYCODONE HCL 5 MG PO TABS
ORAL_TABLET | ORAL | Status: AC
  Filled 2022-09-02: qty 1

## 2022-09-02 MED ORDER — ACETAMINOPHEN 500 MG PO TABS: 1000.0000 mg | ORAL_TABLET | Freq: Four times a day (QID) | ORAL | Status: AC | PRN

## 2022-09-02 MED ORDER — DROPERIDOL 2.5 MG/ML IJ SOLN
0.6250 mg | Freq: Once | INTRAMUSCULAR | Status: AC | PRN
  Administered 2022-09-02: 0.625 mg via INTRAVENOUS

## 2022-09-02 MED ORDER — PHENYLEPHRINE HCL-NACL 20-0.9 MG/250ML-% IV SOLN
INTRAVENOUS | Status: DC | PRN
  Administered 2022-09-02: 20 ug/min via INTRAVENOUS

## 2022-09-02 MED ORDER — BUPIVACAINE LIPOSOME 1.3 % IJ SUSP
INTRAMUSCULAR | Status: AC
  Filled 2022-09-02: qty 10

## 2022-09-02 MED ORDER — PHENYLEPHRINE HCL-NACL 20-0.9 MG/250ML-% IV SOLN
INTRAVENOUS | Status: AC
  Filled 2022-09-02: qty 250

## 2022-09-02 MED ORDER — FENTANYL CITRATE (PF) 100 MCG/2ML IJ SOLN
25.0000 ug | INTRAMUSCULAR | Status: DC | PRN
  Administered 2022-09-02 (×4): 25 ug via INTRAVENOUS

## 2022-09-02 MED ORDER — PHENYLEPHRINE HCL (PRESSORS) 10 MG/ML IV SOLN
INTRAVENOUS | Status: DC | PRN
  Administered 2022-09-02 (×2): 80 ug via INTRAVENOUS

## 2022-09-02 SURGICAL SUPPLY — 50 items
ADH SKN CLS APL DERMABOND .7 (GAUZE/BANDAGES/DRESSINGS) ×1
BAG PRESSURE INF REUSE 1000 (BAG) IMPLANT
CANNULA CAP OBTURATR AIRSEAL 8 (CAP) IMPLANT
CAUTERY HOOK MNPLR 1.6 DVNC XI (INSTRUMENTS) ×1 IMPLANT
CLIP LIGATING HEMO O LOK GREEN (MISCELLANEOUS) ×1 IMPLANT
DERMABOND ADVANCED .7 DNX12 (GAUZE/BANDAGES/DRESSINGS) ×1 IMPLANT
DRAPE ARM DVNC X/XI (DISPOSABLE) ×4 IMPLANT
DRAPE COLUMN DVNC XI (DISPOSABLE) ×1 IMPLANT
ELECT CAUTERY BLADE 6.4 (BLADE) ×1 IMPLANT
ELECT CAUTERY BLADE TIP 2.5 (TIP) ×1
ELECT REM PT RETURN 9FT ADLT (ELECTROSURGICAL) ×1
ELECTRODE CAUTERY BLDE TIP 2.5 (TIP) ×1 IMPLANT
ELECTRODE REM PT RTRN 9FT ADLT (ELECTROSURGICAL) ×1 IMPLANT
FORCEPS BPLR R/ABLATION 8 DVNC (INSTRUMENTS) ×1 IMPLANT
FORCEPS PROGRASP DVNC XI (FORCEP) ×1 IMPLANT
GLOVE SURG SYN 7.0 (GLOVE) ×5 IMPLANT
GLOVE SURG SYN 7.0 PF PI (GLOVE) ×2 IMPLANT
GLOVE SURG SYN 7.5 E (GLOVE) ×5 IMPLANT
GLOVE SURG SYN 7.5 PF PI (GLOVE) ×2 IMPLANT
GOWN STRL REUS W/ TWL LRG LVL3 (GOWN DISPOSABLE) ×4 IMPLANT
GOWN STRL REUS W/TWL LRG LVL3 (GOWN DISPOSABLE) ×5
IRRIGATOR SUCT 8 DISP DVNC XI (IRRIGATION / IRRIGATOR) IMPLANT
IV NS 1000ML (IV SOLUTION)
IV NS 1000ML BAXH (IV SOLUTION) IMPLANT
KIT PINK PAD W/HEAD ARE REST (MISCELLANEOUS) ×1
KIT PINK PAD W/HEAD ARM REST (MISCELLANEOUS) ×1 IMPLANT
LABEL OR SOLS (LABEL) ×1 IMPLANT
MANIFOLD NEPTUNE II (INSTRUMENTS) ×1 IMPLANT
NDL HYPO 22X1.5 SAFETY MO (MISCELLANEOUS) ×1 IMPLANT
NEEDLE HYPO 22X1.5 SAFETY MO (MISCELLANEOUS) ×1 IMPLANT
NS IRRIG 500ML POUR BTL (IV SOLUTION) ×1 IMPLANT
OBTURATOR OPTICAL STND 8 DVNC (TROCAR) ×1
OBTURATOR OPTICALSTD 8 DVNC (TROCAR) ×1 IMPLANT
PACK LAP CHOLECYSTECTOMY (MISCELLANEOUS) ×1 IMPLANT
PENCIL SMOKE EVACUATOR (MISCELLANEOUS) ×1 IMPLANT
SEAL UNIV 5-12 XI (MISCELLANEOUS) ×4 IMPLANT
SET TUBE FILTERED XL AIRSEAL (SET/KITS/TRAYS/PACK) IMPLANT
SET TUBE SMOKE EVAC HIGH FLOW (TUBING) ×1 IMPLANT
SOL ELECTROSURG ANTI STICK (MISCELLANEOUS) ×1
SOLUTION ELECTROSURG ANTI STCK (MISCELLANEOUS) ×1 IMPLANT
SPIKE FLUID TRANSFER (MISCELLANEOUS) ×1 IMPLANT
SPONGE T-LAP 18X18 ~~LOC~~+RFID (SPONGE) IMPLANT
SPONGE T-LAP 4X18 ~~LOC~~+RFID (SPONGE) ×1 IMPLANT
SUT MNCRL AB 4-0 PS2 18 (SUTURE) ×1 IMPLANT
SUT VIC AB 3-0 SH 27 (SUTURE)
SUT VIC AB 3-0 SH 27X BRD (SUTURE) IMPLANT
SUT VICRYL 0 UR6 27IN ABS (SUTURE) ×2 IMPLANT
SYS BAG RETRIEVAL 10MM (BASKET) ×1
SYSTEM BAG RETRIEVAL 10MM (BASKET) ×1 IMPLANT
WATER STERILE IRR 500ML POUR (IV SOLUTION) ×1 IMPLANT

## 2022-09-02 NOTE — Transfer of Care (Signed)
Immediate Anesthesia Transfer of Care Note  Patient: Emily Golden  Procedure(s) Performed: XI ROBOTIC ASSISTED LAPAROSCOPIC CHOLECYSTECTOMY INDOCYANINE GREEN FLUORESCENCE IMAGING (ICG)  Patient Location: PACU  Anesthesia Type:General  Level of Consciousness: awake  Airway & Oxygen Therapy: Patient Spontanous Breathing  Post-op Assessment: Report given to RN and Post -op Vital signs reviewed and stable  Post vital signs: Reviewed and stable  Last Vitals:  Vitals Value Taken Time  BP 153/69 09/02/22 1449  Temp 36.2 C 09/02/22 1449  Pulse 66 09/02/22 1452  Resp 14 09/02/22 1452  SpO2 98 % 09/02/22 1452  Vitals shown include unfiled device data.  Last Pain:  Vitals:   09/02/22 1134  TempSrc: Temporal  PainSc: 3          Complications: No notable events documented.

## 2022-09-02 NOTE — Progress Notes (Signed)
Dr Aleen Campi notified [name] Heck CNM to do [facility] consult for patient. Per [name] Heck CNM, only need pre/post op fetal heart tones. L&D Nurse, Ezequiel Kayser RN, came down to perform fetal heart tones preoperatively, fetal heart tones 140.

## 2022-09-02 NOTE — Anesthesia Preprocedure Evaluation (Signed)
Anesthesia Evaluation  Patient identified by MRN, date of birth, ID band Patient awake    Reviewed: Allergy & Precautions, H&P , NPO status , Patient's Chart, lab work & pertinent test results, reviewed documented beta blocker date and time   Airway Mallampati: II  TM Distance: >3 FB Neck ROM: full    Dental  (+) Teeth Intact   Pulmonary asthma , former smoker   Pulmonary exam normal        Cardiovascular Exercise Tolerance: Good (-) angina (-) CHF negative cardio ROS Normal cardiovascular exam Rhythm:regular Rate:Normal     Neuro/Psych   Anxiety  Bipolar Disorder   negative neurological ROS  negative psych ROS   GI/Hepatic negative GI ROS, Neg liver ROS,,,  Endo/Other    Morbid obesity  Renal/GU negative Renal ROS  negative genitourinary   Musculoskeletal   Abdominal   Peds  Hematology negative hematology ROS (+)   Anesthesia Other Findings Past Medical History: No date: Allergy No date: Anxiety No date: Asthma No date: Bipolar 1 disorder (HCC) No date: Gallstones Past Surgical History: No date: compound fracture right arm; Right No date: NASAL RECONSTRUCTION BMI    Body Mass Index: 42.96 kg/m     Reproductive/Obstetrics negative OB ROS                             Anesthesia Physical Anesthesia Plan  ASA: 3  Anesthesia Plan: General ETT   Post-op Pain Management:    Induction:   PONV Risk Score and Plan: 4 or greater  Airway Management Planned:   Additional Equipment:   Intra-op Plan:   Post-operative Plan:   Informed Consent: I have reviewed the patients History and Physical, chart, labs and discussed the procedure including the risks, benefits and alternatives for the proposed anesthesia with the patient or authorized representative who has indicated his/her understanding and acceptance.     Dental Advisory Given  Plan Discussed with: CRNA  Anesthesia  Plan Comments: (Pt is [redacted] weeks gestation and we have discussed the risks and benefits of GA while pregnant.  OB eval pre procedure has been requested. ja)       Anesthesia Quick Evaluation

## 2022-09-02 NOTE — Discharge Instructions (Addendum)
Discharge Instructions 1.  Patient may shower, but do not scrub wounds heavily and dab dry only. 2.  Do not submerge wounds in pool/tub until fully healed. 3.  Do not apply ointments or hydrogen peroxide to the wounds. 4.  May apply ice packs to the wounds for comfort. 5.  No heavy lifting or pushing of more than 10-15 lbs for 4 weeks. 6.  Do not drive while taking narcotics for pain control.  Prior to driving, make sure you are able to rotate right and left to look at blindspots without significant pain or discomfort.   AMBULATORY SURGERY  DISCHARGE INSTRUCTIONS   The drugs that you were given will stay in your system until tomorrow so for the next 24 hours you should not:  Drive an automobile Make any legal decisions Drink any alcoholic beverage   You may resume regular meals tomorrow.  Today it is better to start with liquids and gradually work up to solid foods.  You may eat anything you prefer, but it is better to start with liquids, then soup and crackers, and gradually work up to solid foods.   Please notify your doctor immediately if you have any unusual bleeding, trouble breathing, redness and pain at the surgery site, drainage, fever, or pain not relieved by medication.    Additional Instructions:   Please contact your physician with any problems or Same Day Surgery at [phone], Monday through Friday 6 am to 4 pm, or [facility] [place] Main number at [phone]. 

## 2022-09-02 NOTE — Op Note (Signed)
  Procedure Date:  09/02/2022  Pre-operative Diagnosis:  Symptomatic cholelithiasis  Post-operative Diagnosis: Symptomatic cholelithiasis  Procedure:  Robotic assisted cholecystectomy with ICG FireFly cholangiogram  Surgeon:  Howie Ill, MD  Anesthesia:  General endotracheal  Estimated Blood Loss:  10 ml  Specimens:  gallbladder  Complications:  None  Indications for Procedure:  This is a 22 y.o. female who presents with abdominal pain and workup revealing symptomatic cholelithiasis.  She is [redacted] weeks pregnant today.  Discussed with her options for watchful waiting with conservative measures vs cholecystectomy and she has opted to proceed with surgery.  The benefits, complications, treatment options, and expected outcomes were discussed with the patient. The risks of bleeding, infection, recurrence of symptoms, failure to resolve symptoms, bile duct damage, bile duct leak, retained common bile duct stone, bowel injury, and need for further procedures were all discussed with the patient and she was willing to proceed.  Description of Procedure: The patient was correctly identified in the preoperative area and brought into the operating room.  The patient was placed supine with VTE prophylaxis in place.  Appropriate time-outs were performed.  Anesthesia was induced and the patient was intubated.  Appropriate antibiotics were infused.  The abdomen was prepped and draped in a sterile fashion. An infraumbilical incision was made. A cutdown technique was used to enter the abdominal cavity without injury, and a 12 mm robotic port was inserted.  Pneumoperitoneum was obtained with appropriate opening pressures.  The uterus was inspected and there was no injury.  Three 8-mm ports were placed in the mid abdomen at the level of the umbilicus under direct visualization.  The DaVinci platform was docked, camera targeted, and instruments were placed under direct visualization.  The gallbladder was  identified.  The fundus was grasped and retracted cephalad.  Adhesions were lysed bluntly and with electrocautery. The infundibulum was grasped and retracted laterally, exposing the peritoneum overlying the gallbladder.  This was incised with electrocautery and extended on either side of the gallbladder.  FireFly cholangiogram was then obtained, and we were able to clearly identify the cystic duct and common bile duct.  The cystic duct and cystic artery were carefully dissected with combination of cautery and blunt dissection.  Both were clipped twice proximally and once distally, cutting in between.  The gallbladder was taken from the gallbladder fossa in a retrograde fashion with electrocautery. The gallbladder was placed in an Endocatch bag. The liver bed was inspected and any bleeding was controlled with electrocautery. The right upper quadrant was then inspected again revealing intact clips, no bleeding, and no ductal injury.  The 8 mm ports were removed under direct visualization and the 12 mm port was removed.  The Endocatch bag was brought out via the umbilical incision. The fascial opening was closed using 0 vicryl suture.  Local anesthetic was infused in all incisions and the incisions were closed with 4-0 Monocryl.  The wounds were cleaned and sealed with DermaBond.  The patient was emerged from anesthesia and extubated and brought to the recovery room for further management.  The patient tolerated the procedure well and all counts were correct at the end of the case.   Howie Ill, MD

## 2022-09-02 NOTE — Interval H&P Note (Signed)
History and Physical Interval Note:  09/02/2022 12:30 PM  Emily Golden  has presented today for surgery, with the diagnosis of symptomatic cholelithiasis.  The various methods of treatment have been discussed with the patient and family. After consideration of risks, benefits and other options for treatment, the patient has consented to  Procedure(s): XI ROBOTIC ASSISTED LAPAROSCOPIC CHOLECYSTECTOMY (N/A) INDOCYANINE GREEN FLUORESCENCE IMAGING (ICG) (N/A) as a surgical intervention.  The patient's history has been reviewed, patient examined, no change in status, stable for surgery.  I have reviewed the patient's chart and labs.  Questions were answered to the patient's satisfaction.     Emily Golden

## 2022-09-02 NOTE — OR Nursing (Signed)
Patient is [redacted] weeks pregnant. OB has been consulted and the OR was notified that all that was needed to monitor the fetus was before and after surgery fetal heart tones. Per Dr. Aleen Campi we will proceed with the surgery as planned. I consulted all parties involved in the care of Emily Golden and was instructed to prepare to do the surgery.

## 2022-09-02 NOTE — Anesthesia Procedure Notes (Signed)
Procedure Name: Intubation Date/Time: 09/02/2022 1:13 PM  Performed by: Cheral Bay, CRNAPre-anesthesia Checklist: Patient identified, Emergency Drugs available, Suction available and Patient being monitored Patient Re-evaluated:Patient Re-evaluated prior to induction Oxygen Delivery Method: Circle system utilized Preoxygenation: Pre-oxygenation with 100% oxygen Induction Type: IV induction and Rapid sequence Laryngoscope Size: McGraph and 3 Grade View: Grade I Tube type: Oral Tube size: 7.0 mm Number of attempts: 1 Airway Equipment and Method: Stylet and Oral airway Placement Confirmation: ETT inserted through vocal cords under direct vision, positive ETCO2 and breath sounds checked- equal and bilateral Secured at: 20 cm Tube secured with: Tape Dental Injury: Teeth and Oropharynx as per pre-operative assessment

## 2022-09-06 NOTE — Anesthesia Postprocedure Evaluation (Signed)
Anesthesia Post Note  Patient: Emily Golden  Procedure(s) Performed: XI ROBOTIC ASSISTED LAPAROSCOPIC CHOLECYSTECTOMY INDOCYANINE GREEN FLUORESCENCE IMAGING (ICG)  Patient location during evaluation: PACU Anesthesia Type: General Level of consciousness: awake and alert Pain management: pain level controlled Vital Signs Assessment: post-procedure vital signs reviewed and stable Respiratory status: spontaneous breathing, nonlabored ventilation, respiratory function stable and patient connected to nasal cannula oxygen Cardiovascular status: blood pressure returned to baseline and stable Postop Assessment: no apparent nausea or vomiting Anesthetic complications: no   No notable events documented.   Last Vitals:  Vitals:   09/02/22 1550 09/02/22 1551  BP:  120/77  Pulse: 69 (!) 58  Resp: 18 17  Temp:  36.9 C  SpO2: 91% 100%    Last Pain:  Vitals:   09/02/22 1551  TempSrc: Oral  PainSc: 4                  Yevette Edwards

## 2022-09-20 ENCOUNTER — Ambulatory Visit (INDEPENDENT_AMBULATORY_CARE_PROVIDER_SITE_OTHER): Payer: MEDICAID | Admitting: Physician Assistant

## 2022-09-20 ENCOUNTER — Encounter: Payer: Self-pay | Admitting: Physician Assistant

## 2022-09-20 VITALS — BP 135/79 | HR 91 | Temp 98.3°F | Ht 67.5 in | Wt 285.0 lb

## 2022-09-20 DIAGNOSIS — K8020 Calculus of gallbladder without cholecystitis without obstruction: Secondary | ICD-10-CM

## 2022-09-20 DIAGNOSIS — Z09 Encounter for follow-up examination after completed treatment for conditions other than malignant neoplasm: Secondary | ICD-10-CM

## 2022-09-20 NOTE — Progress Notes (Signed)
[facility] POST-OP OFFICE VISIT  09/20/2022  HPI: Emily Golden is a 22 y.o. female 18 days s/p robotic assisted laparoscopic cholecystectomy for symptomatic cholelithiasis with Dr Aleen Campi   She is doing very well She has no pain at this time related to surgery No fever, chills, emesis She is nauseous but this is consistent throughout her pregnancy and not changed after surgery Tolerating PO; no diarrhea Incisions healing well No other complaints   Vital signs: BP 135/79   Pulse 91   Temp 98.3 F (36.8 C)   Ht 5' 7.5" (1.715 m)   Wt 285 lb (129.3 kg)   LMP 03/18/2022   SpO2 98%   BMI 43.98 kg/m    Physical Exam: Constitutional: Well appearing female, NAD Abdomen: Soft, non-tender, non-distended, no rebound/guarding Skin: Laparoscopic incisions are healing well, no erythema or drainage   Assessment/Plan: This is a 22 y.o. female 18 days s/p robotic assisted laparoscopic cholecystectomy for symptomatic cholelithiasis with Dr Aleen Campi    - Pain control prn  - Reviewed wound care recommendation  - Reviewed lifting restrictions; 4 weeks total  - Reviewed surgical pathology; Cholelithiasis   - She can follow up on as needed basis; She understands to call with questions/concerns  -- Lynden Oxford, PA-C [facility] 09/20/2022, 2:11 PM M-F: 7am - 4pm

## 2022-09-20 NOTE — Patient Instructions (Signed)

## 2022-10-07 DIAGNOSIS — O35BXX0 Maternal care for other (suspected) fetal abnormality and damage, fetal cardiac anomalies, not applicable or unspecified: Secondary | ICD-10-CM | POA: Insufficient documentation

## 2022-10-11 ENCOUNTER — Encounter (HOSPITAL_COMMUNITY): Payer: Self-pay | Admitting: Obstetrics and Gynecology

## 2022-10-11 ENCOUNTER — Inpatient Hospital Stay (HOSPITAL_COMMUNITY)
Admission: AD | Admit: 2022-10-11 | Discharge: 2022-10-11 | Disposition: A | Discharge status: Home / Self Care | Payer: MEDICAID | Attending: Obstetrics and Gynecology | Admitting: Obstetrics and Gynecology

## 2022-10-11 DIAGNOSIS — Z3A29 29 weeks gestation of pregnancy: Secondary | ICD-10-CM

## 2022-10-11 DIAGNOSIS — F419 Anxiety disorder, unspecified: Secondary | ICD-10-CM | POA: Diagnosis not present

## 2022-10-11 DIAGNOSIS — Z79899 Other long term (current) drug therapy: Secondary | ICD-10-CM | POA: Insufficient documentation

## 2022-10-11 DIAGNOSIS — E860 Dehydration: Secondary | ICD-10-CM | POA: Diagnosis not present

## 2022-10-11 DIAGNOSIS — O99283 Endocrine, nutritional and metabolic diseases complicating pregnancy, third trimester: Secondary | ICD-10-CM | POA: Diagnosis not present

## 2022-10-11 DIAGNOSIS — F410 Panic disorder [episodic paroxysmal anxiety] without agoraphobia: Secondary | ICD-10-CM | POA: Diagnosis not present

## 2022-10-11 DIAGNOSIS — R42 Dizziness and giddiness: Secondary | ICD-10-CM | POA: Diagnosis present

## 2022-10-11 DIAGNOSIS — O99343 Other mental disorders complicating pregnancy, third trimester: Secondary | ICD-10-CM | POA: Insufficient documentation

## 2022-10-11 DIAGNOSIS — O26893 Other specified pregnancy related conditions, third trimester: Secondary | ICD-10-CM | POA: Diagnosis present

## 2022-10-11 LAB — CBC WITH DIFFERENTIAL/PLATELET
Abs Immature Granulocytes: 0.09 10*3/uL — ABNORMAL HIGH (ref 0.00–0.07)
Basophils Absolute: 0 10*3/uL (ref 0.0–0.1)
Basophils Relative: 0 %
Eosinophils Absolute: 0.1 10*3/uL (ref 0.0–0.5)
Eosinophils Relative: 1 %
HCT: 35.4 % — ABNORMAL LOW (ref 36.0–46.0)
Hemoglobin: 12 g/dL (ref 12.0–15.0)
Immature Granulocytes: 1 %
Lymphocytes Relative: 18 %
Lymphs Abs: 1.7 10*3/uL (ref 0.7–4.0)
MCH: 29.4 pg (ref 26.0–34.0)
MCHC: 33.9 g/dL (ref 30.0–36.0)
MCV: 86.8 fL (ref 80.0–100.0)
Monocytes Absolute: 0.6 10*3/uL (ref 0.1–1.0)
Monocytes Relative: 7 %
Neutro Abs: 7 10*3/uL (ref 1.7–7.7)
Neutrophils Relative %: 73 %
Platelets: 237 10*3/uL (ref 150–400)
RBC: 4.08 MIL/uL (ref 3.87–5.11)
RDW: 13.2 % (ref 11.5–15.5)
WBC: 9.5 10*3/uL (ref 4.0–10.5)
nRBC: 0 % (ref 0.0–0.2)

## 2022-10-11 LAB — URINALYSIS, ROUTINE W REFLEX MICROSCOPIC
Bilirubin Urine: NEGATIVE
Glucose, UA: NEGATIVE mg/dL
Hgb urine dipstick: NEGATIVE
Ketones, ur: 20 mg/dL — AB
Leukocytes,Ua: NEGATIVE
Nitrite: NEGATIVE
Protein, ur: NEGATIVE mg/dL
Specific Gravity, Urine: 1.02 (ref 1.005–1.030)
pH: 7 (ref 5.0–8.0)

## 2022-10-11 LAB — COMPREHENSIVE METABOLIC PANEL
ALT: 14 U/L (ref 0–44)
AST: 17 U/L (ref 15–41)
Albumin: 2.7 g/dL — ABNORMAL LOW (ref 3.5–5.0)
Alkaline Phosphatase: 99 U/L (ref 38–126)
Anion gap: 12 (ref 5–15)
BUN: <5 mg/dL — ABNORMAL LOW (ref 6–20)
CO2: 18 mmol/L — ABNORMAL LOW (ref 22–32)
Calcium: 9 mg/dL (ref 8.9–10.3)
Chloride: 107 mmol/L (ref 98–111)
Creatinine, Ser: 0.48 mg/dL (ref 0.44–1.00)
GFR, Estimated: >60 mL/min (ref >60)
Glucose, Bld: 79 mg/dL (ref 70–99)
Potassium: 3.7 mmol/L (ref 3.5–5.1)
Sodium: 137 mmol/L (ref 135–145)
Total Bilirubin: 0.5 mg/dL (ref 0.3–1.2)
Total Protein: 6.1 g/dL — ABNORMAL LOW (ref 6.5–8.1)

## 2022-10-11 MED ORDER — HYDROXYZINE HCL 25 MG PO TABS: 50.0000 mg | ORAL_TABLET | Freq: Three times a day (TID) | ORAL | 2 refills | Status: DC

## 2022-10-11 NOTE — MAU Provider Note (Signed)
History     CSN: [id number]  Arrival date and time: 10/11/22 0430   None     Chief Complaint  Patient presents with   Dizziness   Emily Golden , a  22 y.o. G4P0030 at [redacted]w[redacted]d presents to MAU with complaints of episodes of nausea, vomiting, hot flashes and feelings of lightheadedness and dizziness. Patient states that she was recently diagnosed with anemia and wonders if that has anything to do with it. Currently taking a PO supplement. She reports that since 3 pm she has been feeling lightheaded and dizzy. Symptoms unrelieved by rest. She states that when she woke up still feeling this way she came to MAU. Diet recall over the last 24 hours was a bagel, and reports that she has not had much water at all. She verbalized that she has some life stressors that contribute to her lack of eating and anxiety. Patient currently on Hydroxyzine. She denies vaginal; bleeding, leaking of fluid and contractions. She endorses positive fetal movement.        Dizziness Pertinent negatives include no abdominal pain, chest pain, chills, fatigue, fever, headaches, nausea, vomiting or weakness.    OB History     Gravida  4   Para  0   Term  0   Preterm  0   AB  3   Living  0      SAB  3   IAB  0   Ectopic  0   Multiple  0   Live Births  0           Past Medical History:  Diagnosis Date   Allergy    Anxiety    Asthma    Bipolar 1 disorder (HCC)    Gallstones     Past Surgical History:  Procedure Laterality Date   compound fracture right arm Right    NASAL RECONSTRUCTION      Family History  Problem Relation Age of Onset   Diabetes Maternal Grandmother    Hypertension Maternal Grandmother    Hyperlipidemia Maternal Grandmother    Heart disease Maternal Grandfather    Cancer Maternal Grandfather    Diabetes Maternal Grandfather    Heart failure Neg Hx     Social History   Tobacco Use   Smoking status: Former    Current packs/day: 0.00    Types: Cigarettes     Quit date: 04/09/2018    Years since quitting: 4.5    Passive exposure: Past   Smokeless tobacco: Never   Tobacco comments:    Says no smoking for 2 months  Vaping Use   Vaping status: Former  Substance Use Topics   Alcohol use: Not Currently    Alcohol/week: 6.0 standard drinks of alcohol    Types: 6 Glasses of wine per week    Comment: occ   Drug use: Not Currently    Types: Marijuana    Comment: cannabis use, last use 6 months ago    Allergies:  Allergies  Allergen Reactions   Other Shortness Of Breath    Dust, Reports smells,perfumes,aerosols HX asthma    Latex Rash    Medications Prior to Admission  Medication Sig Dispense Refill Last Dose   beclomethasone (QVAR) 80 MCG/ACT inhaler Inhale 2 puffs into the lungs daily as needed (for shortenss of breath).   Past Month   ondansetron (ZOFRAN) 8 MG tablet Take 8 mg by mouth every 8 (eight) hours as needed for nausea or vomiting.   10/10/2022  Prenatal Vit-Fe Fumarate-FA (MULTIVITAMIN-PRENATAL) 27-0.8 MG TABS tablet Take 1 tablet by mouth daily at 12 noon.   10/10/2022   acetaminophen (TYLENOL) 500 MG tablet Take 2 tablets (1,000 mg total) by mouth every 6 (six) hours as needed for mild pain.      ALBUTEROL IN Inhale 1-2 puffs into the lungs as needed.      Blood Pressure Monitoring DEVI 1 each by Does not apply route once a week. 1 each 0    hydrOXYzine (VISTARIL) 25 MG capsule Take 75 mg by mouth in the morning and at bedtime.      promethazine (PHENERGAN) 12.5 MG tablet Take 1 tablet (12.5 mg total) by mouth every 6 (six) hours as needed for nausea or vomiting. 30 tablet 0    sertraline (ZOLOFT) 25 MG tablet Take 25 mg by mouth daily.   More than a month    Review of Systems  Constitutional:  Negative for chills, fatigue and fever.  Eyes:  Negative for pain and visual disturbance.  Respiratory:  Negative for apnea, shortness of breath and wheezing.   Cardiovascular:  Negative for chest pain and palpitations.   Gastrointestinal:  Negative for abdominal pain, constipation, diarrhea, nausea and vomiting.  Genitourinary:  Negative for difficulty urinating, dysuria, pelvic pain, vaginal bleeding, vaginal discharge and vaginal pain.  Musculoskeletal:  Negative for back pain.  Neurological:  Positive for dizziness and light-headedness. Negative for seizures, weakness and headaches.  Psychiatric/Behavioral:  Negative for suicidal ideas.    Physical Exam   Blood pressure 122/87, pulse (!) 121, temperature 98.3 F (36.8 C), temperature source Oral, resp. rate 18, last menstrual period 03/18/2022, SpO2 99%, unknown if currently breastfeeding.  Physical Exam Vitals and nursing note reviewed.  Constitutional:      General: She is not in acute distress.    Appearance: Normal appearance.  HENT:     Head: Normocephalic.  Pulmonary:     Effort: Pulmonary effort is normal.  Musculoskeletal:     Cervical back: Normal range of motion.  Skin:    General: Skin is warm and dry.  Neurological:     Mental Status: She is alert and oriented to person, place, and time.  Psychiatric:        Mood and Affect: Mood normal.   FHT: 135 bpm with moderate variability, accels  present no decels. (Appropriate for gestational age)  - Toco: Quiet   MAU Course  Procedures Orders Placed This Encounter  Procedures   Urinalysis, Routine w reflex microscopic -Urine, Clean Catch   CBC with Differential/Platelet   Comprehensive metabolic panel   Orthostatic vital signs   ED EKG   Results for orders placed or performed during the hospital encounter of 10/11/22 (from the past 24 hour(s))  Urinalysis, Routine w reflex microscopic -Urine, Clean Catch     Status: Abnormal   Collection Time: 10/11/22  5:37 AM  Result Value Ref Range   Color, Urine AMBER (A) YELLOW   APPearance HAZY (A) CLEAR   Specific Gravity, Urine 1.020 1.005 - 1.030   pH 7.0 5.0 - 8.0   Glucose, UA NEGATIVE NEGATIVE mg/dL   Hgb urine dipstick NEGATIVE  NEGATIVE   Bilirubin Urine NEGATIVE NEGATIVE   Ketones, ur 20 (A) NEGATIVE mg/dL   Protein, ur NEGATIVE NEGATIVE mg/dL   Nitrite NEGATIVE NEGATIVE   Leukocytes,Ua NEGATIVE NEGATIVE  CBC with Differential/Platelet     Status: Abnormal   Collection Time: 10/11/22  6:24 AM  Result Value Ref Range   WBC  9.5 4.0 - 10.5 K/uL   RBC 4.08 3.87 - 5.11 MIL/uL   Hemoglobin 12.0 12.0 - 15.0 g/dL   HCT 78.2 (L) 95.6 - 21.3 %   MCV 86.8 80.0 - 100.0 fL   MCH 29.4 26.0 - 34.0 pg   MCHC 33.9 30.0 - 36.0 g/dL   RDW 08.6 57.8 - 46.9 %   Platelets 237 150 - 400 K/uL   nRBC 0.0 0.0 - 0.2 %   Neutrophils Relative % 73 %   Neutro Abs 7.0 1.7 - 7.7 K/uL   Lymphocytes Relative 18 %   Lymphs Abs 1.7 0.7 - 4.0 K/uL   Monocytes Relative 7 %   Monocytes Absolute 0.6 0.1 - 1.0 K/uL   Eosinophils Relative 1 %   Eosinophils Absolute 0.1 0.0 - 0.5 K/uL   Basophils Relative 0 %   Basophils Absolute 0.0 0.0 - 0.1 K/uL   Immature Granulocytes 1 %   Abs Immature Granulocytes 0.09 (H) 0.00 - 0.07 K/uL  Comprehensive metabolic panel     Status: Abnormal   Collection Time: 10/11/22  6:24 AM  Result Value Ref Range   Sodium 137 135 - 145 mmol/L   Potassium 3.7 3.5 - 5.1 mmol/L   Chloride 107 98 - 111 mmol/L   CO2 18 (L) 22 - 32 mmol/L   Glucose, Bld 79 70 - 99 mg/dL   BUN <5 (L) 6 - 20 mg/dL   Creatinine, Ser 6.29 0.44 - 1.00 mg/dL   Calcium 9.0 8.9 - 52.8 mg/dL   Total Protein 6.1 (L) 6.5 - 8.1 g/dL   Albumin 2.7 (L) 3.5 - 5.0 g/dL   AST 17 15 - 41 U/L   ALT 14 0 - 44 U/L   Alkaline Phosphatase 99 38 - 126 U/L   Total Bilirubin 0.5 0.3 - 1.2 mg/dL   GFR, Estimated >41 >32 mL/min   Anion gap 12 5 - 15   Patient Vitals for the past 24 hrs:  BP Temp Temp src Pulse Resp SpO2  10/11/22 0642 122/87 -- -- (!) 121 -- 99 %  10/11/22 0639 132/82 -- -- 96 -- --  10/11/22 0637 125/79 -- -- 82 -- --  10/11/22 0636 122/69 -- -- 84 -- --  10/11/22 0635 -- -- -- -- -- 100 %  10/11/22 0630 -- -- -- -- -- 100 %   10/11/22 0625 -- -- -- -- -- 100 %  10/11/22 0620 -- -- -- -- -- 99 %  10/11/22 0616 (!) 147/81 -- -- (!) 103 -- --  10/11/22 0615 -- -- -- -- -- 100 %  10/11/22 0610 -- -- -- -- -- 99 %  10/11/22 0605 -- -- -- -- -- 99 %  10/11/22 0600 -- -- -- -- -- 99 %  10/11/22 0555 -- -- -- -- -- 99 %  10/11/22 0550 -- -- -- -- -- 99 %  10/11/22 0545 -- -- -- -- -- 99 %  10/11/22 0540 137/87 -- -- (!) 117 -- 99 %  10/11/22 0535 -- -- -- -- -- 99 %  10/11/22 0529 (!) 140/78 98.3 F (36.8 C) Oral (!) 136 18 100 %    MDM - Hgb 12.0- low suspicion for anemia in pregnancy.  - UA noted for 20 of ketone amber in color and hazy. Could be suspicious of mild dehydration causing episodes.  - Patient initially tachycardic. EKG normal,  - Orthostatic VS within limits.  - Through further discussion, noted that patient may be  having anxiety attacks.  - Consulted Dr. Donavan Foil on increasing hydroxyzine until patient can be seen by a OB psychiatrist. Per MD increase to 50mg  TID as needed.  - plan for discharge    Assessment and Plan   1. Dehydration   2. Anxiety   3. Panic attacks   4. [redacted] weeks gestation of pregnancy    - Reviewed that dehydration may can cause theses episodes. Also reviewed that patient needs to increase protein and healthy carbs within her diet and increase water intake.  - Encouraged patient to talk with OB to have a plan in place to restart psych meds in the immediate PP period. Patient agreeable to plan of care.  - Worsening signs and return precautions reviewed.  - FHT appropriate for gestational age at time of discharge.  - Patient discharged home in stable condition and may return to MAU as needed.   Claudette Head, MSN CNM  10/11/2022, 7:23 AM

## 2022-10-11 NOTE — MAU Note (Signed)
.  Emily Golden is a 22 y.o. at [redacted]w[redacted]d here in MAU reporting: has had constant dizzy spells for past three weeks, reports was dx with anemia on Thursday. Yesterday at 1500 felt dizzy and lightheaded, reports fell asleep and woke up around 2000 last PM feeling the same and reports feeling like her heart is racing and "can't catch my breath". Patient reports feeling "very hot" and "out of it"   Denies vb, lof, and reports +FM  Onset of complaint: Yesterday 1500 Pain score: n/a Vitals:   10/11/22 0529  BP: (!) 140/78  Pulse: (!) 136  Resp: 18  Temp: 98.3 F (36.8 C)  SpO2: 100%     [id number] straight back to room  Lab orders placed from triage:

## 2022-11-08 DIAGNOSIS — Z2839 Other underimmunization status: Secondary | ICD-10-CM | POA: Insufficient documentation

## 2022-11-21 ENCOUNTER — Encounter: Payer: Self-pay | Admitting: Obstetrics and Gynecology

## 2022-11-21 ENCOUNTER — Other Ambulatory Visit: Payer: Self-pay

## 2022-11-21 ENCOUNTER — Other Ambulatory Visit (HOSPITAL_COMMUNITY)
Admission: RE | Admit: 2022-11-21 | Discharge: 2022-11-21 | Disposition: A | Discharge status: Home / Self Care | Payer: MEDICAID | Source: Ambulatory Visit | Attending: Obstetrics and Gynecology | Admitting: Obstetrics and Gynecology

## 2022-11-21 ENCOUNTER — Encounter: Payer: Self-pay | Admitting: *Deleted

## 2022-11-21 ENCOUNTER — Ambulatory Visit (INDEPENDENT_AMBULATORY_CARE_PROVIDER_SITE_OTHER): Payer: MEDICAID | Admitting: Obstetrics and Gynecology

## 2022-11-21 VITALS — BP 136/87 | HR 98 | Wt 300.0 lb

## 2022-11-21 DIAGNOSIS — Z3A35 35 weeks gestation of pregnancy: Secondary | ICD-10-CM

## 2022-11-21 DIAGNOSIS — Z2839 Other underimmunization status: Secondary | ICD-10-CM

## 2022-11-21 DIAGNOSIS — O99213 Obesity complicating pregnancy, third trimester: Secondary | ICD-10-CM

## 2022-11-21 DIAGNOSIS — Z3480 Encounter for supervision of other normal pregnancy, unspecified trimester: Secondary | ICD-10-CM | POA: Insufficient documentation

## 2022-11-21 DIAGNOSIS — F17200 Nicotine dependence, unspecified, uncomplicated: Secondary | ICD-10-CM | POA: Diagnosis not present

## 2022-11-21 DIAGNOSIS — O09893 Supervision of other high risk pregnancies, third trimester: Secondary | ICD-10-CM | POA: Diagnosis not present

## 2022-11-21 DIAGNOSIS — K8020 Calculus of gallbladder without cholecystitis without obstruction: Secondary | ICD-10-CM

## 2022-11-21 DIAGNOSIS — Z113 Encounter for screening for infections with a predominantly sexual mode of transmission: Secondary | ICD-10-CM | POA: Diagnosis not present

## 2022-11-21 DIAGNOSIS — O35BXX0 Maternal care for other (suspected) fetal abnormality and damage, fetal cardiac anomalies, not applicable or unspecified: Secondary | ICD-10-CM

## 2022-11-21 DIAGNOSIS — F3181 Bipolar II disorder: Secondary | ICD-10-CM

## 2022-11-21 DIAGNOSIS — B3731 Acute candidiasis of vulva and vagina: Secondary | ICD-10-CM

## 2022-11-21 DIAGNOSIS — R000 Tachycardia, unspecified: Secondary | ICD-10-CM

## 2022-11-21 DIAGNOSIS — O99210 Obesity complicating pregnancy, unspecified trimester: Secondary | ICD-10-CM

## 2022-11-21 MED ORDER — NYSTATIN-TRIAMCINOLONE 100000-0.1 UNIT/GM-% EX OINT: 1.0000 | TOPICAL_OINTMENT | Freq: Two times a day (BID) | CUTANEOUS | 1 refills | Status: DC | PRN

## 2022-11-21 MED ORDER — TERCONAZOLE 0.4 % VA CREA: 1.0000 | TOPICAL_CREAM | Freq: Every day | VAGINAL | 0 refills | Status: DC

## 2022-11-21 NOTE — Progress Notes (Signed)
   PRENATAL VISIT NOTE  Subjective:  Emily Golden is a 22 y.o. G4P0030 at [redacted]w[redacted]d being seen today for initial prenatal care.  She is currently monitored for the following issues for this high-risk pregnancy and has PTSD (post-traumatic stress disorder); Suicide attempt by drug ingestion (HCC); Bipolar 2 disorder (HCC); GAD (generalized anxiety disorder); ADHD; Tobacco use disorder; Alcohol consumption binge drinking; Grief; Supervision of other normal pregnancy, antepartum; Symptomatic cholelithiasis; Fetal tetralogy of Fallot affecting antepartum care of mother; Irritable bowel syndrome; Maternal varicella, non-immune; and Obesity in pregnancy on their problem list.  Patient reports no complaints.  Contractions: Irritability. Vag. Bleeding: None.  Movement: Present. Denies leaking of fluid.   The following portions of the patient's history were reviewed and updated as appropriate: allergies, current medications, past family history, past medical history, past social history, past surgical history and problem list.   Objective:   Vitals:   11/21/22 0921  BP: 136/87  Pulse: 98  Weight: 300 lb (136.1 kg)    Fetal Status: Fetal Heart Rate (bpm): 130   Movement: Present     General:  Alert, oriented and cooperative. Patient is in no acute distress.  Skin: Skin is warm and dry. No rash noted.   Cardiovascular: Normal heart rate noted  Respiratory: Normal respiratory effort, no problems with respiration noted  Abdomen: Soft, gravid, appropriate for gestational age.  Pain/Pressure: Present     Pelvic: Cervical exam performed in the presence of a chaperone        Extremities: Normal range of motion.  Edema: Mild pitting, slight indentation  Mental Status: Normal mood and affect. Normal behavior. Normal judgment and thought content.   Assessment and Plan:  Pregnancy: G4P0030 at [redacted]w[redacted]d 1. Tetralogy of Fallot of fetus affecting management of mother, antepartum, single or unspecified  fetus Established delivery plan is 39 weeks at [facility]. It is scheduled for 12/15. Has appt this Friday with them.  Declined genetic testing.  Growth on 10/25 at [facility] was normal - 50%ile, AC [redacted]w[redacted]d, HC [redacted]w[redacted]d  2. Tobacco use disorder Quit  3. Supervision of other normal pregnancy, antepartum 1 hr was 114 Pt had to transfer because told by [facility], if not going to deliver there, they could not continue care with them.  Reports diffuse swelling - will check urine P/C. BP wnl.  Tachycardia - Will send to [facility] OB. Had EKG during the pregnancy and was wnl.  External burning and rawness - red on exam c/f yeast. Will send in treatment but checked cultures too.   4. Obesity in pregnancy  5. Maternal varicella, non-immune Offer Varicella vaccine after delivery  6. Symptomatic cholelithiasis S/p lap chole at 24 weeks  7. Bipolar 2 disorder (HCC) Per note at [facility]: Previously dx at [facility] and managed on abilify and additional medication - unsure which. Has been using atarax with some help. Rx zoloft but stopped taking as this was not helpful.   8. Pregnancy with 35 completed weeks gestation   Preterm labor symptoms and general obstetric precautions including but not limited to vaginal bleeding, contractions, leaking of fluid and fetal movement were reviewed in detail with the patient. Please refer to After Visit Summary for other counseling recommendations.   Return in about 1 week (around 11/28/2022) for OB VISIT, MD or APP.  No future appointments.   Milas Hock, MD

## 2022-11-22 ENCOUNTER — Telehealth: Payer: Self-pay | Admitting: Lactation Services

## 2022-11-22 ENCOUNTER — Encounter (HOSPITAL_COMMUNITY): Payer: Self-pay | Admitting: Obstetrics and Gynecology

## 2022-11-22 ENCOUNTER — Inpatient Hospital Stay (HOSPITAL_COMMUNITY)
Admission: AD | Admit: 2022-11-22 | Discharge: 2022-11-22 | Disposition: A | Discharge status: Home / Self Care | Payer: MEDICAID | Attending: Obstetrics and Gynecology | Admitting: Obstetrics and Gynecology

## 2022-11-22 ENCOUNTER — Other Ambulatory Visit: Payer: Self-pay

## 2022-11-22 DIAGNOSIS — Z3A35 35 weeks gestation of pregnancy: Secondary | ICD-10-CM | POA: Insufficient documentation

## 2022-11-22 DIAGNOSIS — Z3689 Encounter for other specified antenatal screening: Secondary | ICD-10-CM | POA: Diagnosis not present

## 2022-11-22 DIAGNOSIS — O26893 Other specified pregnancy related conditions, third trimester: Secondary | ICD-10-CM

## 2022-11-22 DIAGNOSIS — R030 Elevated blood-pressure reading, without diagnosis of hypertension: Secondary | ICD-10-CM | POA: Diagnosis present

## 2022-11-22 DIAGNOSIS — R519 Headache, unspecified: Secondary | ICD-10-CM | POA: Diagnosis present

## 2022-11-22 DIAGNOSIS — B3731 Acute candidiasis of vulva and vagina: Secondary | ICD-10-CM

## 2022-11-22 DIAGNOSIS — R0602 Shortness of breath: Secondary | ICD-10-CM | POA: Diagnosis present

## 2022-11-22 LAB — PROTEIN / CREATININE RATIO, URINE
Creatinine, Urine: 60.2 mg/dL
Creatinine, Urine: 62 mg/dL
Protein, Ur: 9.5 mg/dL
Protein/Creat Ratio: 158 mg/g{creat} (ref 0–200)
Total Protein, Urine: <6 mg/dL

## 2022-11-22 LAB — CBC WITH DIFFERENTIAL/PLATELET
Abs Immature Granulocytes: 0.15 10*3/uL — ABNORMAL HIGH (ref 0.00–0.07)
Basophils Absolute: 0.1 10*3/uL (ref 0.0–0.1)
Basophils Relative: 0 %
Eosinophils Absolute: 0.1 10*3/uL (ref 0.0–0.5)
Eosinophils Relative: 1 %
HCT: 38.6 % (ref 36.0–46.0)
Hemoglobin: 12.7 g/dL (ref 12.0–15.0)
Immature Granulocytes: 1 %
Lymphocytes Relative: 21 %
Lymphs Abs: 2.5 10*3/uL (ref 0.7–4.0)
MCH: 28.6 pg (ref 26.0–34.0)
MCHC: 32.9 g/dL (ref 30.0–36.0)
MCV: 86.9 fL (ref 80.0–100.0)
Monocytes Absolute: 0.9 10*3/uL (ref 0.1–1.0)
Monocytes Relative: 7 %
Neutro Abs: 8.6 10*3/uL — ABNORMAL HIGH (ref 1.7–7.7)
Neutrophils Relative %: 70 %
Platelets: 263 10*3/uL (ref 150–400)
RBC: 4.44 MIL/uL (ref 3.87–5.11)
RDW: 13.5 % (ref 11.5–15.5)
WBC: 12.2 10*3/uL — ABNORMAL HIGH (ref 4.0–10.5)
nRBC: 0 % (ref 0.0–0.2)

## 2022-11-22 LAB — COMPREHENSIVE METABOLIC PANEL
ALT: 10 U/L (ref 0–44)
AST: 16 U/L (ref 15–41)
Albumin: 2.7 g/dL — ABNORMAL LOW (ref 3.5–5.0)
Alkaline Phosphatase: 137 U/L — ABNORMAL HIGH (ref 38–126)
Anion gap: 9 (ref 5–15)
BUN: 6 mg/dL (ref 6–20)
CO2: 19 mmol/L — ABNORMAL LOW (ref 22–32)
Calcium: 9.5 mg/dL (ref 8.9–10.3)
Chloride: 111 mmol/L (ref 98–111)
Creatinine, Ser: 0.44 mg/dL (ref 0.44–1.00)
GFR, Estimated: >60 mL/min (ref >60)
Glucose, Bld: 73 mg/dL (ref 70–99)
Potassium: 3.8 mmol/L (ref 3.5–5.1)
Sodium: 139 mmol/L (ref 135–145)
Total Bilirubin: 0.5 mg/dL (ref <1.2)
Total Protein: 6.5 g/dL (ref 6.5–8.1)

## 2022-11-22 LAB — CERVICOVAGINAL ANCILLARY ONLY
Bacterial Vaginitis (gardnerella): NEGATIVE
Candida Glabrata: NEGATIVE
Candida Vaginitis: NEGATIVE
Chlamydia: NEGATIVE
Comment: NEGATIVE
Comment: NEGATIVE
Comment: NEGATIVE
Comment: NEGATIVE
Comment: NEGATIVE
Comment: NORMAL
Neisseria Gonorrhea: NEGATIVE
Trichomonas: NEGATIVE

## 2022-11-22 LAB — URINALYSIS, ROUTINE W REFLEX MICROSCOPIC
Bilirubin Urine: NEGATIVE
Glucose, UA: NEGATIVE mg/dL
Hgb urine dipstick: NEGATIVE
Ketones, ur: NEGATIVE mg/dL
Leukocytes,Ua: NEGATIVE
Nitrite: NEGATIVE
Protein, ur: NEGATIVE mg/dL
Specific Gravity, Urine: 1.011 (ref 1.005–1.030)
pH: 7 (ref 5.0–8.0)

## 2022-11-22 MED ORDER — LACTATED RINGERS IV BOLUS
1000.0000 mL | Freq: Once | INTRAVENOUS | Status: AC
  Administered 2022-11-22: 1000 mL via INTRAVENOUS

## 2022-11-22 NOTE — MAU Note (Addendum)
Emily Golden is a 22 y.o. at [redacted]w[redacted]d here in MAU reporting: Patient reports she was seen in the office yesterday and had a urine PCR done. Pt reports her BP was elevated at the office yesterday. Pt has swelling in her hands, face, and feet. She reports she took her BP at home today and it was 128/99. She also reports headache, dizziness, and lightheadedness. She also reports it feels like her "heart is racing" and feels SOB, she reports she "feels like she can't take a deep enough breath". Endorses +FM, Denies VB and LOF.    Onset of complaint: 1600 Pain score: 0/10 Vitals:   11/22/22 1816  BP: 110/82  Pulse: (!) 132  Temp: 98.7 F (37.1 C)     FHT:152 Lab orders placed from triage:  none

## 2022-11-22 NOTE — MAU Provider Note (Signed)
History     CSN: [id number]  Arrival date and time: 11/22/22 1737   Event Date/Time   First Provider Initiated Contact with Patient 11/22/22 1900      Chief Complaint  Patient presents with   Hypertension   Headache   Tachycardia   Patient here reporting elevated BP at home, per patient elevated in office yesterday as well. Endorses HA, dizziness, and lightheadedness. Declines interventions. Reports feelings that her heart is racing. Feeling regular and vigorous fetal movement. Denies VB, LOF, vaginal discharge.    Hypertension Associated symptoms include headaches and palpitations. Pertinent negatives include no chest pain or shortness of breath.  Headache  Pertinent negatives include no abdominal pain, coughing, fever, nausea or vomiting. Her past medical history is significant for hypertension.    OB History     Gravida  4   Para  0   Term  0   Preterm  0   AB  3   Living  0      SAB  3   IAB  0   Ectopic  0   Multiple  0   Live Births  0           Past Medical History:  Diagnosis Date   Allergy    Anxiety    Asthma    Bipolar 1 disorder (HCC)    Gallstones     Past Surgical History:  Procedure Laterality Date   compound fracture right arm Right    NASAL RECONSTRUCTION      Family History  Problem Relation Age of Onset   Diabetes Maternal Grandmother    Hypertension Maternal Grandmother    Hyperlipidemia Maternal Grandmother    Heart disease Maternal Grandfather    Cancer Maternal Grandfather    Diabetes Maternal Grandfather    Heart failure Neg Hx     Social History   Tobacco Use   Smoking status: Former    Current packs/day: 0.00    Types: Cigarettes    Quit date: 04/09/2018    Years since quitting: 4.6    Passive exposure: Past   Smokeless tobacco: Never   Tobacco comments:    Stopped smoking 3 months before she got pregnant  Vaping Use   Vaping status: Former  Substance Use Topics   Alcohol use: Not Currently    Drug use: Not Currently    Allergies:  Allergies  Allergen Reactions   Other Shortness Of Breath    Dust, Reports smells,perfumes,aerosols HX asthma    Latex Rash    No medications prior to admission.    Review of Systems  Constitutional:  Negative for chills, fatigue, fever and unexpected weight change.  Respiratory:  Negative for cough and shortness of breath.   Cardiovascular:  Positive for palpitations. Negative for chest pain.  Gastrointestinal:  Negative for abdominal pain, constipation, diarrhea, nausea and vomiting.  Genitourinary:  Negative for difficulty urinating, flank pain, frequency and urgency.  Neurological:  Positive for headaches.   Physical Exam   Blood pressure 132/68, pulse 86, temperature 98.7 F (37.1 C), temperature source Oral, height 5' 7.5" (1.715 m), weight (!) 137.4 kg, last menstrual period 03/18/2022, SpO2 100%, unknown if currently breastfeeding.  Physical Exam Vitals reviewed.  Constitutional:      Appearance: Normal appearance.  HENT:     Head: Normocephalic.  Cardiovascular:     Rate and Rhythm: Normal rate.     Pulses: Normal pulses.  Pulmonary:     Effort: Pulmonary effort is normal.  Skin:    General: Skin is warm and dry.     Capillary Refill: Capillary refill takes less than 2 seconds.  Neurological:     Mental Status: She is alert and oriented to person, place, and time.  Psychiatric:        Mood and Affect: Mood normal.        Behavior: Behavior normal.        Thought Content: Thought content normal.        Judgment: Judgment normal.     Fetal Assessment 125 bpm, Mod Var, -Decels, +Accels Toco: UI  MAU Course   Results for orders placed or performed during the hospital encounter of 11/22/22 (from the past 24 hour(s))  Urinalysis, Routine w reflex microscopic -Urine, Clean Catch     Status: None   Collection Time: 11/22/22  6:20 PM  Result Value Ref Range   Color, Urine YELLOW YELLOW   APPearance CLEAR CLEAR    Specific Gravity, Urine 1.011 1.005 - 1.030   pH 7.0 5.0 - 8.0   Glucose, UA NEGATIVE NEGATIVE mg/dL   Hgb urine dipstick NEGATIVE NEGATIVE   Bilirubin Urine NEGATIVE NEGATIVE   Ketones, ur NEGATIVE NEGATIVE mg/dL   Protein, ur NEGATIVE NEGATIVE mg/dL   Nitrite NEGATIVE NEGATIVE   Leukocytes,Ua NEGATIVE NEGATIVE  Protein / creatinine ratio, urine     Status: None   Collection Time: 11/22/22  6:20 PM  Result Value Ref Range   Creatinine, Urine 62 mg/dL   Total Protein, Urine <6 mg/dL   Protein Creatinine Ratio        0.00 - 0.15 mg/mg[Cre]  CBC with Differential/Platelet     Status: Abnormal   Collection Time: 11/22/22  6:36 PM  Result Value Ref Range   WBC 12.2 (H) 4.0 - 10.5 K/uL   RBC 4.44 3.87 - 5.11 MIL/uL   Hemoglobin 12.7 12.0 - 15.0 g/dL   HCT 16.1 09.6 - 04.5 %   MCV 86.9 80.0 - 100.0 fL   MCH 28.6 26.0 - 34.0 pg   MCHC 32.9 30.0 - 36.0 g/dL   RDW 40.9 81.1 - 91.4 %   Platelets 263 150 - 400 K/uL   nRBC 0.0 0.0 - 0.2 %   Neutrophils Relative % 70 %   Neutro Abs 8.6 (H) 1.7 - 7.7 K/uL   Lymphocytes Relative 21 %   Lymphs Abs 2.5 0.7 - 4.0 K/uL   Monocytes Relative 7 %   Monocytes Absolute 0.9 0.1 - 1.0 K/uL   Eosinophils Relative 1 %   Eosinophils Absolute 0.1 0.0 - 0.5 K/uL   Basophils Relative 0 %   Basophils Absolute 0.1 0.0 - 0.1 K/uL   Immature Granulocytes 1 %   Abs Immature Granulocytes 0.15 (H) 0.00 - 0.07 K/uL  Comprehensive metabolic panel     Status: Abnormal   Collection Time: 11/22/22  6:36 PM  Result Value Ref Range   Sodium 139 135 - 145 mmol/L   Potassium 3.8 3.5 - 5.1 mmol/L   Chloride 111 98 - 111 mmol/L   CO2 19 (L) 22 - 32 mmol/L   Glucose, Bld 73 70 - 99 mg/dL   BUN 6 6 - 20 mg/dL   Creatinine, Ser 7.82 0.44 - 1.00 mg/dL   Calcium 9.5 8.9 - 95.6 mg/dL   Total Protein 6.5 6.5 - 8.1 g/dL   Albumin 2.7 (L) 3.5 - 5.0 g/dL   AST 16 15 - 41 U/L   ALT 10 0 - 44 U/L  Alkaline Phosphatase 137 (H) 38 - 126 U/L   Total Bilirubin 0.5 <1.2 mg/dL    GFR, Estimated >96 >04 mL/min   Anion gap 9 5 - 15   No results found.  MDM PE Labs: UA, PCR, CBC, CMP EFM  Assessment and Plan  22y G 4 P0030  SIUP at 35.4 weeks Cat 1  FT   - Exam findings discussed. -  Patient normotensive/ soft BP readings here. HA has improved following IV fluid bolus.  - Labs benign. - Counseled on appropriate nutrition and hydration. - NST reactive, prolonged acceleration intermittently. - Return to MAU PRN. - Discharged home in stable condition.    Richardson Landry MSN, CNM 11/22/2022, 9:28 PM

## 2022-11-22 NOTE — Telephone Encounter (Signed)
PA for Kenalog Cream sent through covermymeds. Awaiting response.   MURPHEE TOUPIN (Key: [id number]) Rx #: [id number] Need Help? Call us at [phone] Status Sent to Plan today Drug Nystatin-Triamcinolone 100000-0.1UNIT/GM-% ointment ePA cloud logo Form [employer] Electronic Prior Authorization Form Original Claim Info 75 Prior Authorization Required

## 2022-11-23 ENCOUNTER — Encounter: Payer: Self-pay | Admitting: Lactation Services

## 2022-11-23 LAB — STREP GP B NAA: Strep Gp B NAA: NEGATIVE

## 2022-11-23 MED ORDER — CLOTRIMAZOLE-BETAMETHASONE 1-0.05 % EX CREA
1.0000 | TOPICAL_CREAM | Freq: Two times a day (BID) | CUTANEOUS | 0 refills | Status: DC
Start: 2022-11-23 — End: 2022-12-13

## 2022-11-23 NOTE — Addendum Note (Signed)
Addended by: Milas Hock A on: 11/23/2022 12:34 PM   Modules accepted: Orders

## 2022-11-23 NOTE — Telephone Encounter (Signed)
Kenalog denied by [employer]. Will route to Dr. Para March for advisement.    ANDREEA MUENZER (Key: [id number]) PA Case ID #: [id number] Rx #: [id number] Need Help? Call us at [phone] Outcome Denied on November 19 by [employer] 2017 Denied Drug Nystatin-Triamcinolone 100000-0.1UNIT/GM-% ointment ePA cloud logo Form [employer] Electronic Prior Authorization Form Original Claim Info 75 Prior Authorization Required

## 2022-11-23 NOTE — Telephone Encounter (Signed)
Received communication from [employer]. Insurance will cover Nystatin, Clotrimazole Cream, Clotrimazole-Betamethasone Cream ( generic for Latrisone), and Ketoconazole Cream. Please send in RX for one of the above.

## 2022-11-28 ENCOUNTER — Other Ambulatory Visit: Payer: Self-pay

## 2022-11-28 ENCOUNTER — Telehealth: Payer: Self-pay

## 2022-11-28 ENCOUNTER — Ambulatory Visit: Payer: MEDICAID | Admitting: Family Medicine

## 2022-11-28 ENCOUNTER — Ambulatory Visit: Payer: MEDICAID | Attending: Cardiology

## 2022-11-28 VITALS — BP 132/81 | HR 98 | Wt 303.0 lb

## 2022-11-28 DIAGNOSIS — O99313 Alcohol use complicating pregnancy, third trimester: Secondary | ICD-10-CM

## 2022-11-28 DIAGNOSIS — O99413 Diseases of the circulatory system complicating pregnancy, third trimester: Secondary | ICD-10-CM

## 2022-11-28 DIAGNOSIS — F17200 Nicotine dependence, unspecified, uncomplicated: Secondary | ICD-10-CM

## 2022-11-28 DIAGNOSIS — O35BXX0 Maternal care for other (suspected) fetal abnormality and damage, fetal cardiac anomalies, not applicable or unspecified: Secondary | ICD-10-CM

## 2022-11-28 DIAGNOSIS — O99343 Other mental disorders complicating pregnancy, third trimester: Secondary | ICD-10-CM

## 2022-11-28 DIAGNOSIS — R42 Dizziness and giddiness: Secondary | ICD-10-CM

## 2022-11-28 DIAGNOSIS — F4310 Post-traumatic stress disorder, unspecified: Secondary | ICD-10-CM

## 2022-11-28 DIAGNOSIS — Z3480 Encounter for supervision of other normal pregnancy, unspecified trimester: Secondary | ICD-10-CM

## 2022-11-28 DIAGNOSIS — F411 Generalized anxiety disorder: Secondary | ICD-10-CM

## 2022-11-28 DIAGNOSIS — R000 Tachycardia, unspecified: Secondary | ICD-10-CM

## 2022-11-28 DIAGNOSIS — F3181 Bipolar II disorder: Secondary | ICD-10-CM

## 2022-11-28 DIAGNOSIS — Z3A36 36 weeks gestation of pregnancy: Secondary | ICD-10-CM

## 2022-11-28 DIAGNOSIS — O99333 Smoking (tobacco) complicating pregnancy, third trimester: Secondary | ICD-10-CM

## 2022-11-28 DIAGNOSIS — F1010 Alcohol abuse, uncomplicated: Secondary | ICD-10-CM

## 2022-11-28 NOTE — Telephone Encounter (Signed)
Called pt to inform her the monitor will be mailed out. She verbalized understand now further questions at this time.

## 2022-11-28 NOTE — Progress Notes (Addendum)
   PRENATAL VISIT NOTE  Subjective:  Emily Golden is a 22 y.o. G4P0030 at [redacted]w[redacted]d being seen today for ongoing prenatal care.  She is currently monitored for the following issues for this low-risk pregnancy and has PTSD (post-traumatic stress disorder); Suicide attempt by drug ingestion (HCC); Bipolar 2 disorder (HCC); GAD (generalized anxiety disorder); ADHD; Tobacco use disorder; Alcohol consumption binge drinking; Grief; Supervision of other normal pregnancy, antepartum; Symptomatic cholelithiasis; Fetal tetralogy of Fallot affecting antepartum care of mother; Irritable bowel syndrome; Maternal varicella, non-immune; and Obesity in pregnancy on their problem list.  Patient reports no complaints.  Contractions: Not present. Vag. Bleeding: None.  Movement: Present. Denies leaking of fluid.   The following portions of the patient's history were reviewed and updated as appropriate: allergies, current medications, past family history, past medical history, past social history, past surgical history and problem list.   Objective:   Vitals:   11/28/22 1119  BP: 132/81  Pulse: 98  Weight: (!) 303 lb (137.4 kg)    Fetal Status: Fetal Heart Rate (bpm): 132 Fundal Height: 35 cm Movement: Present     General:  Alert, oriented and cooperative. Patient is in no acute distress.  Skin: Skin is warm and dry. No rash noted.   Cardiovascular: Normal heart rate noted  Respiratory: Normal respiratory effort, no problems with respiration noted  Abdomen: Soft, gravid, appropriate for gestational age.  Pain/Pressure: Present     Pelvic: Cervical exam deferred        Extremities: Normal range of motion.  Edema: None  Mental Status: Normal mood and affect. Normal behavior. Normal judgment and thought content.   Assessment and Plan:  Pregnancy: G4P0030 at [redacted]w[redacted]d 1. Supervision of other normal pregnancy, antepartum Continue prenatal care. GBS negative  2. [redacted] weeks gestation of pregnancy  3. Tetralogy of  Fallot of fetus affecting management of mother, antepartum, single or unspecified fetus [facility]OL @ 39 weeks at [facility] Has u/s for growth with [facility] due to TOF in fetus  4. Heart racing and dizziness Has appt with Cardio/OB Asked for zio to be given prior to appt.  Preterm labor symptoms and general obstetric precautions including but not limited to vaginal bleeding, contractions, leaking of fluid and fetal movement were reviewed in detail with the patient. Please refer to After Visit Summary for other counseling recommendations.   Return in 1 week (on 12/05/2022).  Future Appointments  Date Time Provider Department Center  12/05/2022  8:15 AM Federico Flake, MD [facility]  12/05/2022 10:30 AM [name], DO [facility] None    [name], MD

## 2022-11-28 NOTE — Progress Notes (Unsigned)
Enrolled for [employer] to mail a ZIO XT long term holter monitor to the patients address on file.  

## 2022-11-28 NOTE — Telephone Encounter (Signed)
-----   Message from [name] sent at 11/28/2022  2:59 PM EST ----- Yes please call the patient, appreciate you ----- Message ----- From: [name], RN Sent: 11/28/2022   2:53 PM EST To: [name], MD; [name], DO  Zio ordered. Do I need to call the patient? -Emily Golden ----- Message ----- From: [name], DO Sent: 11/28/2022  11:45 AM EST To: [name], MD; [name], RN  Hi Emily Golden, Please set her up with a Zio - for 7 days.   KT ----- Message ----- From: [name], MD Sent: 11/28/2022  11:38 AM EST To: [name], DO  Can she get a zio prior to seeing you? Has heart racing and dizziness.

## 2022-11-28 NOTE — Progress Notes (Signed)
7 day Zio ordered per Dr. Mallory Shirk request for dizziness and racing heart beat.

## 2022-12-02 DIAGNOSIS — R000 Tachycardia, unspecified: Secondary | ICD-10-CM | POA: Diagnosis not present

## 2022-12-02 DIAGNOSIS — R42 Dizziness and giddiness: Secondary | ICD-10-CM | POA: Diagnosis not present

## 2022-12-05 ENCOUNTER — Encounter: Payer: Self-pay | Admitting: Cardiology

## 2022-12-05 ENCOUNTER — Ambulatory Visit (INDEPENDENT_AMBULATORY_CARE_PROVIDER_SITE_OTHER): Payer: MEDICAID | Admitting: Family Medicine

## 2022-12-05 ENCOUNTER — Ambulatory Visit: Payer: MEDICAID | Attending: Cardiology | Admitting: Cardiology

## 2022-12-05 ENCOUNTER — Other Ambulatory Visit: Payer: Self-pay

## 2022-12-05 VITALS — BP 114/86 | HR 148 | Ht 67.0 in | Wt 306.4 lb

## 2022-12-05 VITALS — BP 122/89 | HR 132 | Wt 305.0 lb

## 2022-12-05 DIAGNOSIS — R0609 Other forms of dyspnea: Secondary | ICD-10-CM

## 2022-12-05 DIAGNOSIS — O99343 Other mental disorders complicating pregnancy, third trimester: Secondary | ICD-10-CM

## 2022-12-05 DIAGNOSIS — Z3A37 37 weeks gestation of pregnancy: Secondary | ICD-10-CM | POA: Diagnosis not present

## 2022-12-05 DIAGNOSIS — R002 Palpitations: Secondary | ICD-10-CM

## 2022-12-05 DIAGNOSIS — O99210 Obesity complicating pregnancy, unspecified trimester: Secondary | ICD-10-CM

## 2022-12-05 DIAGNOSIS — Z3480 Encounter for supervision of other normal pregnancy, unspecified trimester: Secondary | ICD-10-CM

## 2022-12-05 DIAGNOSIS — E669 Obesity, unspecified: Secondary | ICD-10-CM

## 2022-12-05 DIAGNOSIS — F3181 Bipolar II disorder: Secondary | ICD-10-CM

## 2022-12-05 DIAGNOSIS — O35BXX0 Maternal care for other (suspected) fetal abnormality and damage, fetal cardiac anomalies, not applicable or unspecified: Secondary | ICD-10-CM

## 2022-12-05 DIAGNOSIS — O99333 Smoking (tobacco) complicating pregnancy, third trimester: Secondary | ICD-10-CM

## 2022-12-05 DIAGNOSIS — F17200 Nicotine dependence, unspecified, uncomplicated: Secondary | ICD-10-CM

## 2022-12-05 DIAGNOSIS — O99213 Obesity complicating pregnancy, third trimester: Secondary | ICD-10-CM

## 2022-12-05 MED ORDER — PROPRANOLOL HCL 10 MG PO TABS: 10.0000 mg | ORAL_TABLET | Freq: Every evening | ORAL | 3 refills | Status: DC

## 2022-12-05 NOTE — Progress Notes (Signed)
   PRENATAL VISIT NOTE  Subjective:  Emily Golden is a 22 y.o. G4P0030 at [redacted]w[redacted]d being seen today for ongoing prenatal care.  She is currently monitored for the following issues for this high-risk pregnancy and has PTSD (post-traumatic stress disorder); Suicide attempt by drug ingestion (HCC); Bipolar 2 disorder (HCC); GAD (generalized anxiety disorder); ADHD; Tobacco use disorder; Alcohol consumption binge drinking; Grief; Supervision of other normal pregnancy, antepartum; Symptomatic cholelithiasis; Fetal tetralogy of Fallot affecting antepartum care of mother; Irritable bowel syndrome; Maternal varicella, non-immune; and Obesity in pregnancy on their problem list.  Patient reports  Cramping-- like when she took medications for miscarriage, reports it started TG and was on and off since then. Baby is moving but pattern has changed. Has her Zio Patch on and upcoming cardio OB appt .  Contractions: Irritability. Vag. Bleeding: None.  Movement: Present. Denies leaking of fluid.   The following portions of the patient's history were reviewed and updated as appropriate: allergies, current medications, past family history, past medical history, past social history, past surgical history and problem list.   Objective:   Vitals:   12/05/22 0824  BP: 122/89  Pulse: (!) 132  Weight: (!) 305 lb (138.3 kg)    Fetal Status: Fetal Heart Rate (bpm): 151 Fundal Height: 37 cm Movement: Present     General:  Alert, oriented and cooperative. Patient is in no acute distress.  Skin: Skin is warm and dry. No rash noted.   Cardiovascular: Normal heart rate noted  Respiratory: Normal respiratory effort, no problems with respiration noted  Abdomen: Soft, gravid, appropriate for gestational age.  Pain/Pressure: Present     Pelvic: Cervical exam deferred Dilation: Closed Effacement (%): 70 Station: Ballotable  Extremities: Normal range of motion.  Edema: None  Mental Status: Normal mood and affect. Normal behavior.  Normal judgment and thought content.   Assessment and Plan:  Pregnancy: G4P0030 at [redacted]w[redacted]d 1. Bipolar 2 disorder (HCC) Stable  2. Obesity in pregnancy TWG=25 lb (11.3 kg)   3. Tetralogy of Fallot of fetus affecting management of mother, antepartum, single or unspecified fetus Delivery at [facility]@39wks  Has [place] at [facility] this week  4. Supervision of other normal pregnancy, antepartum Had ctx on TG-- cervix was thin but not dilated today Reviewed labor precautions Unable to palpate presenting part digitally-- US performed to confirm presentation. Vertex confirmed Reviewed that is she is ever unsure about labor to come to MAU-- we do not want her to be at home and have an unexpected delivery. Reviewed that we prefer she go to [facility] for labor symptoms but that safety is paramount.  She understand this and has follow up scheduled at [facility].   5. Tobacco use disorder   Term labor symptoms and general obstetric precautions including but not limited to vaginal bleeding, contractions, leaking of fluid and fetal movement were reviewed in detail with the patient. Please refer to After Visit Summary for other counseling recommendations.   Return in about 1 week (around 12/12/2022) for Routine prenatal care.  Future Appointments  Date Time Provider Department Center  12/06/2022  7:30 AM [facility] [place] 1 [facility] None  12/12/2022 11:15 AM Adam Phenix, MD [facility]  12/12/2022  1:20 PM [name], DO [facility] None    Federico Flake, MD

## 2022-12-05 NOTE — Patient Instructions (Addendum)
Medication Instructions:  Your physician has recommended you make the following change in your medication:  START: Propranolol 10 mg nightly *If you need a refill on your cardiac medications before your next appointment, please call your pharmacy*   Testing/Procedures: Your physician has requested that you have an echocardiogram - OB, urgent. Echocardiography is a painless test that uses sound waves to create images of your heart. It provides your doctor with information about the size and shape of your heart and how well your heart's chambers and valves are working. This procedure takes approximately one hour. There are no restrictions for this procedure. Please do NOT wear cologne, perfume, aftershave, or lotions (deodorant is allowed). Please arrive 15 minutes prior to your appointment time.  Please note: We ask at that you not bring children with you during ultrasound (echo/ vascular) testing. Due to room size and safety concerns, children are not allowed in the ultrasound rooms during exams. Our front office staff cannot provide observation of children in our lobby area while testing is being conducted. An adult accompanying a patient to their appointment will only be allowed in the ultrasound room at the discretion of the ultrasound technician under special circumstances. We apologize for any inconvenience.   Follow-Up: At [facility], you and your health needs are our priority.  As part of our continuing mission to provide you with exceptional heart care, we have created designated Provider Care Teams.  These Care Teams include your primary Cardiologist (physician) and Advanced Practice Providers (APPs -  Physician Assistants and Nurse Practitioners) who all work together to provide you with the care you need, when you need it.   Your next appointment:   1 week (Dec 9th) double book  Provider:   [name], DO

## 2022-12-05 NOTE — Progress Notes (Signed)
Cardio-Obstetrics Clinic  New Evaluation  Date:  12/05/2022   ID:  Emily Golden, DOB [birth year], MRN [id number]  PCP:  Lance Bosch, NP   [facility] Providers Cardiologist:  [name], DO  Electrophysiologist:  None       Referring MD: Milas Hock, MD   Chief Complaint: " Ia   History of Present Illness:    Emily Golden is a 22 y.o. female [G4P0030] who is being seen today for the evaluation of palpitations at the request of Milas Hock, MD.   She presents with palpitations, shortness of breath, and leg swelling. She reports feeling as though she can't catch her breath, particularly during episodes of heart racing. She does not use her inhaler as these episodes do not feel like an asthma attack. She has noticed swelling in her legs, particularly in her left leg which becomes significantly larger than her right. This swelling started early in her pregnancy, around six weeks, and was initially suspected to be a blood clot. She also experiences lightheadedness and weakness following episodes of heart racing, often feeling so drained that she needs to sleep. These symptoms have been increasing in frequency. The patient has a history of anxiety and her baby has been diagnosed with Tetralogy of Fallot.  Prior CV Studies Reviewed: The following studies were reviewed today: None today  Past Medical History:  Diagnosis Date   Allergy    Anxiety    Asthma    Bipolar 1 disorder (HCC)    Gallstones     Past Surgical History:  Procedure Laterality Date   compound fracture right arm Right    NASAL RECONSTRUCTION        OB History     Gravida  4   Para  0   Term  0   Preterm  0   AB  3   Living  0      SAB  3   IAB  0   Ectopic  0   Multiple  0   Live Births  0               Current Medications: Current Meds  Medication Sig   acetaminophen (TYLENOL) 500 MG tablet Take 2 tablets (1,000 mg total) by mouth every 6 (six) hours as needed  for mild pain.   ALBUTEROL IN Inhale 1-2 puffs into the lungs as needed.   beclomethasone (QVAR) 80 MCG/ACT inhaler Inhale 2 puffs into the lungs daily as needed (for shortenss of breath).   clotrimazole-betamethasone (LOTRISONE) cream Apply 1 Application topically 2 (two) times daily.   EPINEPHrine 0.3 mg/0.3 mL IJ SOAJ injection Inject 0.3 mLs into the muscle as needed.   hydrOXYzine (ATARAX) 25 MG tablet Take 2 tablets (50 mg total) by mouth 3 (three) times daily.   nystatin-triamcinolone ointment (MYCOLOG) Apply 1 Application topically 2 (two) times daily as needed.   Prenatal Vit-Fe Fumarate-FA (MULTIVITAMIN-PRENATAL) 27-0.8 MG TABS tablet Take 1 tablet by mouth daily at 12 noon.   propranolol (INDERAL) 10 MG tablet Take 1 tablet (10 mg total) by mouth at bedtime.   terconazole (TERAZOL 7) 0.4 % vaginal cream Place 1 applicator vaginally at bedtime.     Allergies:   Other and Latex   Social History   Socioeconomic History   Marital status: Married    Spouse name: Not on file   Number of children: Not on file   Years of education: Not on file   Highest education level: Not on file  Occupational History   Not on file  Tobacco Use   Smoking status: Former    Current packs/day: 0.00    Types: Cigarettes    Quit date: 04/09/2018    Years since quitting: 4.6    Passive exposure: Past   Smokeless tobacco: Never   Tobacco comments:    Stopped smoking 3 months before she got pregnant  Vaping Use   Vaping status: Former  Substance and Sexual Activity   Alcohol use: Not Currently   Drug use: Not Currently   Sexual activity: Yes  Other Topics Concern   Not on file  Social History Narrative   Is in 10th grade at [employer]   Social Determinants of Health   Financial Resource Strain: Not on file  Food Insecurity: No Food Insecurity (09/22/2021)   Hunger Vital Sign    Worried About Running Out of Food in the Last Year: Never true    Ran Out of Food in the  Last Year: Never true  Transportation Needs: No Transportation Needs (09/22/2021)   PRAPARE - Administrator, Civil Service (Medical): No    Lack of Transportation (Non-Medical): No  Physical Activity: Not on file  Stress: Not on file  Social Connections: Not on file      Family History  Problem Relation Age of Onset   Diabetes Maternal Grandmother    Hypertension Maternal Grandmother    Hyperlipidemia Maternal Grandmother    Heart disease Maternal Grandfather    Cancer Maternal Grandfather    Diabetes Maternal Grandfather    Heart failure Neg Hx       ROS:   Please see the history of present illness.    Palpitations and shortness of breath All other systems reviewed and are negative.   Labs/EKG Reviewed:    EKG:   EKG was not  ordered today.    Recent Labs: 11/22/2022: ALT 10; BUN 6; Creatinine, Ser 0.44; Hemoglobin 12.7; Platelets 263; Potassium 3.8; Sodium 139   Recent Lipid Panel Lab Results  Component Value Date/Time   CHOL 159 07/01/2021 04:38 PM   TRIG 76 07/01/2021 04:38 PM   HDL 42 07/01/2021 04:38 PM   CHOLHDL 3.8 07/01/2021 04:38 PM   LDLCALC 102 (H) 07/01/2021 04:38 PM    Physical Exam:    VS:  BP 114/86 (BP Location: Right Arm, Patient Position: Sitting, Cuff Size: Normal)   Pulse (!) 148   Ht 5\' 7"  (1.702 m)   Wt (!) 306 lb 6.4 oz (139 kg)   LMP 03/18/2022   SpO2 98%   BMI 47.99 kg/m     Wt Readings from Last 3 Encounters:  12/05/22 (!) 306 lb 6.4 oz (139 kg)  12/05/22 (!) 305 lb (138.3 kg)  11/28/22 (!) 303 lb (137.4 kg)     GEN:  Well nourished, well developed in no acute distress HEENT: Normal NECK: No JVD; No carotid bruits LYMPHATICS: No lymphadenopathy CARDIAC: RRR, no murmurs, rubs, gallops RESPIRATORY:  Clear to auscultation without rales, wheezing or rhonchi  ABDOMEN: Soft, non-tender, non-distended MUSCULOSKELETAL:  No edema; No deformity  SKIN: Warm and dry NEUROLOGIC:  Alert and oriented x 3 PSYCHIATRIC:   Normal affect    Risk Assessment/Risk Calculators:     CARPREG II Risk Prediction Index Score:  1.  The patient's risk for a primary cardiac event is 5%.   Modified World Health Organization [facility]) Classification of Maternal CV Risk   Class I  ASSESSMENT & PLAN:    Palpitations and Shortness of Breath in Pregnancy Patient reports episodes of heart racing accompanied by shortness of breath and fatigue. Episodes are severe enough to cause lightheadedness and exhaustion. No clear triggers identified. Patient is currently pregnant with her first child. She is currently wearing a monitor therefore of asked the patient to wear it for 7 days and return. -Order an echocardiogram to assess cardiac function. -Consider starting low-dose propranolol 10 mg daily to control heart rate if symptoms worsen or persist.  Lower Extremity Edema in Pregnancy Patient reports unilateral lower extremity swelling, more pronounced in the right leg. Swelling started early in pregnancy and has persisted. No associated pain or heaviness. Previous evaluation for DVT was negative. -Continue to monitor lower extremity swelling. -Advise patient to elevate legs when possible.  Anxiety Patient reports a history of anxiety and is currently on medication for the same. She has been experiencing increased heart racing episodes, which may be contributing to her anxiety. -Continue current anxiety medication. -Consider the impact of heart racing episodes on anxiety levels and adjust treatment as necessary.  Follow-up Plan to see the patient back in one week to assess the progression of symptoms and discuss the results of the heart monitor and echocardiogram. The patient is scheduled for induction of labor on December 15th, so further management will be determined based on these results and the patient's delivery.   Patient Instructions  Medication Instructions:  Your physician has recommended you make the following  change in your medication:  START: Propranolol 10 mg nightly *If you need a refill on your cardiac medications before your next appointment, please call your pharmacy*   Testing/Procedures: Your physician has requested that you have an echocardiogram - OB, urgent. Echocardiography is a painless test that uses sound waves to create images of your heart. It provides your doctor with information about the size and shape of your heart and how well your heart's chambers and valves are working. This procedure takes approximately one hour. There are no restrictions for this procedure. Please do NOT wear cologne, perfume, aftershave, or lotions (deodorant is allowed). Please arrive 15 minutes prior to your appointment time.  Please note: We ask at that you not bring children with you during ultrasound (echo/ vascular) testing. Due to room size and safety concerns, children are not allowed in the ultrasound rooms during exams. Our front office staff cannot provide observation of children in our lobby area while testing is being conducted. An adult accompanying a patient to their appointment will only be allowed in the ultrasound room at the discretion of the ultrasound technician under special circumstances. We apologize for any inconvenience.   Follow-Up: At [facility], you and your health needs are our priority.  As part of our continuing mission to provide you with exceptional heart care, we have created designated Provider Care Teams.  These Care Teams include your primary Cardiologist (physician) and Advanced Practice Providers (APPs -  Physician Assistants and Nurse Practitioners) who all work together to provide you with the care you need, when you need it.   Your next appointment:   1 week (Dec 9th) double book  Provider:   [name], DO       Dispo:  No follow-ups on file.   Medication Adjustments/Labs and Tests Ordered: Current medicines are reviewed at length with the  patient today.  Concerns regarding medicines are outlined above.  Tests Ordered: Orders Placed This Encounter  Procedures   ECHOCARDIOGRAM COMPLETE  Medication Changes: Meds ordered this encounter  Medications   propranolol (INDERAL) 10 MG tablet    Sig: Take 1 tablet (10 mg total) by mouth at bedtime.    Dispense:  90 tablet    Refill:  3

## 2022-12-06 ENCOUNTER — Ambulatory Visit: Payer: MEDICAID | Attending: Cardiology

## 2022-12-06 DIAGNOSIS — R0609 Other forms of dyspnea: Secondary | ICD-10-CM

## 2022-12-07 LAB — ECHOCARDIOGRAM COMPLETE
AR max vel: 1.9 cm2
AV Area VTI: 1.56 cm2
AV Area mean vel: 1.74 cm2
AV Mean grad: 4 mm[Hg]
AV Peak grad: 7.3 mm[Hg]
Ao pk vel: 1.35 m/s
Area-P 1/2: 5.79 cm2
S' Lateral: 2.7 cm

## 2022-12-12 ENCOUNTER — Inpatient Hospital Stay (HOSPITAL_COMMUNITY)
Admission: AD | Admit: 2022-12-12 | Discharge: 2022-12-13 | Disposition: A | Discharge status: Other Acute Inpatient Hospital | Payer: MEDICAID | Attending: Obstetrics and Gynecology | Admitting: Obstetrics and Gynecology

## 2022-12-12 ENCOUNTER — Other Ambulatory Visit: Payer: Self-pay

## 2022-12-12 ENCOUNTER — Ambulatory Visit (INDEPENDENT_AMBULATORY_CARE_PROVIDER_SITE_OTHER): Payer: MEDICAID | Admitting: Obstetrics & Gynecology

## 2022-12-12 ENCOUNTER — Encounter (HOSPITAL_COMMUNITY): Payer: Self-pay | Admitting: Obstetrics and Gynecology

## 2022-12-12 ENCOUNTER — Ambulatory Visit: Payer: MEDICAID | Admitting: Cardiology

## 2022-12-12 VITALS — BP 123/86 | HR 108 | Wt 306.0 lb

## 2022-12-12 DIAGNOSIS — O368330 Maternal care for abnormalities of the fetal heart rate or rhythm, third trimester, not applicable or unspecified: Secondary | ICD-10-CM | POA: Diagnosis not present

## 2022-12-12 DIAGNOSIS — O99210 Obesity complicating pregnancy, unspecified trimester: Secondary | ICD-10-CM

## 2022-12-12 DIAGNOSIS — Z3A38 38 weeks gestation of pregnancy: Secondary | ICD-10-CM

## 2022-12-12 DIAGNOSIS — R55 Syncope and collapse: Secondary | ICD-10-CM | POA: Insufficient documentation

## 2022-12-12 DIAGNOSIS — O35BXX0 Maternal care for other (suspected) fetal abnormality and damage, fetal cardiac anomalies, not applicable or unspecified: Secondary | ICD-10-CM

## 2022-12-12 DIAGNOSIS — Z3480 Encounter for supervision of other normal pregnancy, unspecified trimester: Secondary | ICD-10-CM

## 2022-12-12 DIAGNOSIS — G44209 Tension-type headache, unspecified, not intractable: Secondary | ICD-10-CM | POA: Insufficient documentation

## 2022-12-12 DIAGNOSIS — O99353 Diseases of the nervous system complicating pregnancy, third trimester: Secondary | ICD-10-CM | POA: Insufficient documentation

## 2022-12-12 DIAGNOSIS — O368390 Maternal care for abnormalities of the fetal heart rate or rhythm, unspecified trimester, not applicable or unspecified: Secondary | ICD-10-CM

## 2022-12-12 DIAGNOSIS — N3000 Acute cystitis without hematuria: Secondary | ICD-10-CM

## 2022-12-12 DIAGNOSIS — O0993 Supervision of high risk pregnancy, unspecified, third trimester: Secondary | ICD-10-CM

## 2022-12-12 DIAGNOSIS — O99213 Obesity complicating pregnancy, third trimester: Secondary | ICD-10-CM

## 2022-12-12 LAB — COMPREHENSIVE METABOLIC PANEL
ALT: 10 U/L (ref 0–44)
AST: 16 U/L (ref 15–41)
Albumin: 2.5 g/dL — ABNORMAL LOW (ref 3.5–5.0)
Alkaline Phosphatase: 140 U/L — ABNORMAL HIGH (ref 38–126)
Anion gap: 8 (ref 5–15)
BUN: 7 mg/dL (ref 6–20)
CO2: 19 mmol/L — ABNORMAL LOW (ref 22–32)
Calcium: 9.1 mg/dL (ref 8.9–10.3)
Chloride: 107 mmol/L (ref 98–111)
Creatinine, Ser: 0.44 mg/dL (ref 0.44–1.00)
GFR, Estimated: >60 mL/min (ref >60)
Glucose, Bld: 91 mg/dL (ref 70–99)
Potassium: 3.9 mmol/L (ref 3.5–5.1)
Sodium: 134 mmol/L — ABNORMAL LOW (ref 135–145)
Total Bilirubin: 0.3 mg/dL (ref <1.2)
Total Protein: 6.1 g/dL — ABNORMAL LOW (ref 6.5–8.1)

## 2022-12-12 LAB — CBC
HCT: 37.5 % (ref 36.0–46.0)
Hemoglobin: 12.2 g/dL (ref 12.0–15.0)
MCH: 27.5 pg (ref 26.0–34.0)
MCHC: 32.5 g/dL (ref 30.0–36.0)
MCV: 84.7 fL (ref 80.0–100.0)
Platelets: 302 10*3/uL (ref 150–400)
RBC: 4.43 MIL/uL (ref 3.87–5.11)
RDW: 13.2 % (ref 11.5–15.5)
WBC: 10.9 10*3/uL — ABNORMAL HIGH (ref 4.0–10.5)
nRBC: 0 % (ref 0.0–0.2)

## 2022-12-12 LAB — URINALYSIS, ROUTINE W REFLEX MICROSCOPIC
Bilirubin Urine: NEGATIVE
Glucose, UA: NEGATIVE mg/dL
Hgb urine dipstick: NEGATIVE
Ketones, ur: NEGATIVE mg/dL
Nitrite: NEGATIVE
Protein, ur: NEGATIVE mg/dL
Specific Gravity, Urine: 1.014 (ref 1.005–1.030)
pH: 6 (ref 5.0–8.0)

## 2022-12-12 LAB — RAPID URINE DRUG SCREEN, HOSP PERFORMED
Amphetamines: NOT DETECTED
Barbiturates: NOT DETECTED
Benzodiazepines: NOT DETECTED
Cocaine: NOT DETECTED
Opiates: NOT DETECTED
Tetrahydrocannabinol: NOT DETECTED

## 2022-12-12 LAB — PROTEIN / CREATININE RATIO, URINE
Creatinine, Urine: 108 mg/dL
Protein Creatinine Ratio: 0.08 mg/mg{creat} (ref 0.00–0.15)
Total Protein, Urine: 9 mg/dL

## 2022-12-12 MED ORDER — LACTATED RINGERS IV BOLUS
1000.0000 mL | Freq: Once | INTRAVENOUS | Status: AC
  Administered 2022-12-12: 1000 mL via INTRAVENOUS

## 2022-12-12 MED ORDER — ACETAMINOPHEN 500 MG PO TABS
1000.0000 mg | ORAL_TABLET | Freq: Once | ORAL | Status: AC
  Administered 2022-12-12: 1000 mg via ORAL
  Filled 2022-12-12: qty 2

## 2022-12-12 MED ORDER — PROMETHAZINE HCL 25 MG PO TABS
25.0000 mg | ORAL_TABLET | Freq: Once | ORAL | Status: AC
  Administered 2022-12-12: 25 mg via ORAL
  Filled 2022-12-12: qty 1

## 2022-12-12 MED ORDER — CYCLOBENZAPRINE HCL 5 MG PO TABS
10.0000 mg | ORAL_TABLET | Freq: Once | ORAL | Status: AC
  Administered 2022-12-12: 10 mg via ORAL
  Filled 2022-12-12: qty 2

## 2022-12-12 MED ORDER — DIPHENHYDRAMINE HCL 25 MG PO CAPS
25.0000 mg | ORAL_CAPSULE | Freq: Once | ORAL | Status: AC
  Administered 2022-12-12: 25 mg via ORAL
  Filled 2022-12-12: qty 1

## 2022-12-12 NOTE — Patient Instructions (Signed)

## 2022-12-12 NOTE — Progress Notes (Signed)
   PRENATAL VISIT NOTE  Subjective:  Emily Golden is a 22 y.o. G4P0030 at [redacted]w[redacted]d being seen today for ongoing prenatal care.  She is currently monitored for the following issues for this high-risk pregnancy and has PTSD (post-traumatic stress disorder); Suicide attempt by drug ingestion (HCC); Bipolar 2 disorder (HCC); GAD (generalized anxiety disorder); ADHD; Tobacco use disorder; Alcohol consumption binge drinking; Grief; Supervision of other normal pregnancy, antepartum; Symptomatic cholelithiasis; Fetal tetralogy of Fallot affecting antepartum care of mother; Irritable bowel syndrome; Maternal varicella, non-immune; and Obesity in pregnancy on their problem list.  Patient reports occasional contractions.  Contractions: Irritability. Vag. Bleeding: None.  Movement: Present. Denies leaking of fluid.   The following portions of the patient's history were reviewed and updated as appropriate: allergies, current medications, past family history, past medical history, past social history, past surgical history and problem list.   Objective:   Vitals:   12/12/22 1119  BP: 123/86  Pulse: (!) 108  Weight: (!) 306 lb (138.8 kg)    Fetal Status:     Movement: Present     General:  Alert, oriented and cooperative. Patient is in no acute distress.  Skin: Skin is warm and dry. No rash noted.   Cardiovascular: Normal heart rate noted  Respiratory: Normal respiratory effort, no problems with respiration noted  Abdomen: Soft, gravid, appropriate for gestational age.  Pain/Pressure: Present     Pelvic: Cervical exam performed in the presence of a chaperone.  No dilation.        Extremities: Normal range of motion.  Edema: Mild pitting, slight indentation  Mental Status: Normal mood and affect. Normal behavior. Normal judgment and thought content.   Assessment and Plan:  Pregnancy: G4P0030 at [redacted]w[redacted]d There are no diagnoses linked to this encounter. Cervical exam performed, no dilation.   Term labor  symptoms and general obstetric precautions including but not limited to vaginal bleeding, contractions, leaking of fluid and fetal movement were reviewed in detail with the patient. Please refer to After Visit Summary for other counseling recommendations.   Patient is scheduled to deliver at [facility] on 12/15  No follow-ups on file.  Future Appointments  Date Time Provider Department Center  12/12/2022  1:20 PM Tobb, Lavona [place], DO [facility] None    MadisonCaitlin M Pepper, Student-PA  Attestation of Attending Supervision of PA Student: Evaluation and management procedures were performed by the PA student under my supervision and collaboration.  I have reviewed the student's note and chart, and I agree with the management and plan.  Scheryl Darter, MD, FACOG Attending Obstetrician & Gynecologist Faculty Practice, [facility]

## 2022-12-12 NOTE — MAU Provider Note (Incomplete)
MAU Provider Note  Chief Complaint: Headache   Event Date/Time   First Provider Initiated Contact with Patient 12/12/22 2220      SUBJECTIVE HPI: Emily Golden is a 22 y.o. G4P0030 at [redacted]w[redacted]d by LMP who presents to maternity admissions reporting severe HA, possible syncopal event. Pregnancy c/b bipolar 2, obesity, tobacco use, fetal tetralogy of Fallot. Receives [facility] with [facility].  Patient notes feeling fine earlier in the day. Went to a birthday party and started to feel lightheaded. Remembers vision darkening. Did not fall. Family notes her face started to turn purple and her lips were pale. Has had a 10/10 headache since. Mostly in temples. Sensitive to light/noise. Denies hx of migraines. Has passed out before with gallbladder pain early in pregnancy. Has seen OB cards and had normal echo.  +FM. Denies regular ctx, VB, LOF.   EMS noted slightly elevated BP.  HPI  Past Medical History:  Diagnosis Date  . Allergy   . Anxiety   . Asthma   . Bipolar 1 disorder (HCC)   . Gallstones    Past Surgical History:  Procedure Laterality Date  . compound fracture right arm Right   . NASAL RECONSTRUCTION     Social History   Socioeconomic History  . Marital status: Married    Spouse name: Not on file  . Number of children: Not on file  . Years of education: Not on file  . Highest education level: Not on file  Occupational History  . Not on file  Tobacco Use  . Smoking status: Former    Current packs/day: 0.00    Types: Cigarettes    Quit date: 04/09/2018    Years since quitting: 4.6    Passive exposure: Past  . Smokeless tobacco: Never  . Tobacco comments:    Stopped smoking 3 months before she got pregnant  Vaping Use  . Vaping status: Former  Substance and Sexual Activity  . Alcohol use: Not Currently  . Drug use: Not Currently  . Sexual activity: Yes  Other Topics Concern  . Not on file  Social History Narrative   Is in 10th grade at [employer]   Social  Determinants of Health   Financial Resource Strain: Not on file  Food Insecurity: No Food Insecurity (09/22/2021)   Hunger Vital Sign   . Worried About Programme researcher, broadcasting/film/video in the Last Year: Never true   . Ran Out of Food in the Last Year: Never true  Transportation Needs: No Transportation Needs (09/22/2021)   PRAPARE - Transportation   . Lack of Transportation (Medical): No   . Lack of Transportation (Non-Medical): No  Physical Activity: Not on file  Stress: Not on file  Social Connections: Not on file  Intimate Partner Violence: Not on file   No current facility-administered medications on file prior to encounter.   Current Outpatient Medications on File Prior to Encounter  Medication Sig Dispense Refill  . acetaminophen (TYLENOL) 500 MG tablet Take 2 tablets (1,000 mg total) by mouth every 6 (six) hours as needed for mild pain.    . hydrOXYzine (ATARAX) 25 MG tablet Take 2 tablets (50 mg total) by mouth 3 (three) times daily. 60 tablet 2  . Prenatal Vit-Fe Fumarate-FA (MULTIVITAMIN-PRENATAL) 27-0.8 MG TABS tablet Take 1 tablet by mouth daily at 12 noon.    . propranolol (INDERAL) 10 MG tablet Take 1 tablet (10 mg total) by mouth at bedtime. 90 tablet 3  . ALBUTEROL IN Inhale 1-2 puffs  into the lungs as needed.    . beclomethasone (QVAR) 80 MCG/ACT inhaler Inhale 2 puffs into the lungs daily as needed (for shortenss of breath).    . clotrimazole-betamethasone (LOTRISONE) cream Apply 1 Application topically 2 (two) times daily. (Patient not taking: Reported on 12/12/2022) 30 g 0  . EPINEPHrine 0.3 mg/0.3 mL IJ SOAJ injection Inject 0.3 mLs into the muscle as needed. (Patient not taking: Reported on 12/12/2022)    . nystatin-triamcinolone ointment (MYCOLOG) Apply 1 Application topically 2 (two) times daily as needed. (Patient not taking: Reported on 12/12/2022) 30 g 1  . terconazole (TERAZOL 7) 0.4 % vaginal cream Place 1 applicator vaginally at bedtime. (Patient not taking: Reported on  12/12/2022) 45 g 0   Allergies  Allergen Reactions  . Other Shortness Of Breath    Dust, Reports smells,perfumes,aerosols HX asthma   . Latex Rash    ROS:  Pertinent positives/negatives listed above.  I have reviewed patient's Past Medical Hx, Surgical Hx, Family Hx, Social Hx, medications and allergies.   Physical Exam  Patient Vitals for the past 24 hrs:  BP Temp Temp src Pulse Resp SpO2 Height Weight  12/12/22 2230 (!) 113/55 -- -- 94 -- 100 % -- --  12/12/22 2206 125/77 97.9 F (36.6 C) Oral 85 16 -- 5' 7.5" (1.715 m) (!) 140 kg   Constitutional: Well-developed, well-nourished female. Appears uncomfortable Cardiovascular: normal rate, regular rhythm, no m/r/g Respiratory: normal effort, CTAB GI: Abd soft, non-tender, gravid MS: Extremities nontender, trace edema, normal ROM Neurologic: Alert and oriented x 4. No gross deficits  FHT:  Baseline 120 , moderate variability, accelerations present, no decelerations Contractions: none  LAB RESULTS Results for orders placed or performed during the hospital encounter of 12/12/22 (from the past 24 hour(s))  Urinalysis, Routine w reflex microscopic -Urine, Clean Catch     Status: Abnormal   Collection Time: 12/12/22  9:57 PM  Result Value Ref Range   Color, Urine YELLOW YELLOW   APPearance HAZY (A) CLEAR   Specific Gravity, Urine 1.014 1.005 - 1.030   pH 6.0 5.0 - 8.0   Glucose, UA NEGATIVE NEGATIVE mg/dL   Hgb urine dipstick NEGATIVE NEGATIVE   Bilirubin Urine NEGATIVE NEGATIVE   Ketones, ur NEGATIVE NEGATIVE mg/dL   Protein, ur NEGATIVE NEGATIVE mg/dL   Nitrite NEGATIVE NEGATIVE   Leukocytes,Ua SMALL (A) NEGATIVE   RBC / HPF 0-5 0 - 5 RBC/hpf   WBC, UA 6-10 0 - 5 WBC/hpf   Bacteria, UA RARE (A) NONE SEEN   Squamous Epithelial / HPF 11-20 0 - 5 /HPF   Mucus PRESENT   Rapid urine drug screen (hospital performed)     Status: None   Collection Time: 12/12/22  9:57 PM  Result Value Ref Range   Opiates NONE DETECTED NONE  DETECTED   Cocaine NONE DETECTED NONE DETECTED   Benzodiazepines NONE DETECTED NONE DETECTED   Amphetamines NONE DETECTED NONE DETECTED   Tetrahydrocannabinol NONE DETECTED NONE DETECTED   Barbiturates NONE DETECTED NONE DETECTED  Protein / creatinine ratio, urine     Status: None   Collection Time: 12/12/22  9:57 PM  Result Value Ref Range   Creatinine, Urine 108 mg/dL   Total Protein, Urine 9 mg/dL   Protein Creatinine Ratio 0.08 0.00 - 0.15 mg/mg[Cre]  CBC     Status: Abnormal   Collection Time: 12/12/22 10:45 PM  Result Value Ref Range   WBC 10.9 (H) 4.0 - 10.5 K/uL   RBC 4.43 3.87 -  5.11 MIL/uL   Hemoglobin 12.2 12.0 - 15.0 g/dL   HCT 60.1 09.3 - 23.5 %   MCV 84.7 80.0 - 100.0 fL   MCH 27.5 26.0 - 34.0 pg   MCHC 32.5 30.0 - 36.0 g/dL   RDW 57.3 22.0 - 25.4 %   Platelets 302 150 - 400 K/uL   nRBC 0.0 0.0 - 0.2 %  Comprehensive metabolic panel     Status: Abnormal   Collection Time: 12/12/22 10:45 PM  Result Value Ref Range   Sodium 134 (L) 135 - 145 mmol/L   Potassium 3.9 3.5 - 5.1 mmol/L   Chloride 107 98 - 111 mmol/L   CO2 19 (L) 22 - 32 mmol/L   Glucose, Bld 91 70 - 99 mg/dL   BUN 7 6 - 20 mg/dL   Creatinine, Ser 2.70 0.44 - 1.00 mg/dL   Calcium 9.1 8.9 - 62.3 mg/dL   Total Protein 6.1 (L) 6.5 - 8.1 g/dL   Albumin 2.5 (L) 3.5 - 5.0 g/dL   AST 16 15 - 41 U/L   ALT 10 0 - 44 U/L   Alkaline Phosphatase 140 (H) 38 - 126 U/L   Total Bilirubin 0.3 <1.2 mg/dL   GFR, Estimated >76 >28 mL/min   Anion gap 8 5 - 15      EKG interpretation: HR 70. Sinus arrhythmia, axis, intervals. No ST changes  IMAGING ECHOCARDIOGRAM COMPLETE  Result Date: 12/07/2022    ECHOCARDIOGRAM REPORT   Patient Name:   Emily Golden Date of Exam: 12/06/2022 Medical Rec #:  [id number]   Height:       67.0 in Accession #:    [id number]  Weight:       306.4 lb Date of Birth:  2000-04-28  BSA:          2.424 m Patient Age:    22 years    BP:           114/86 mmHg Patient Gender: F           HR:            99 bpm. Exam Location:  [facility] Procedure: 2D Echo, Cardiac Doppler and Color Doppler Indications:    R06.02 SOB; R60.0 Lower extremity edema  History:        Patient has no prior history of Echocardiogram examinations.                 Signs/Symptoms:Shortness of Breath and Edema; Risk                 Factors:Former Smoker. Patient denies chest pain. She does have                 SOB with leg edema. Patient is [redacted] weeks pregnant, fetus has                 Tetralogy of Fallot.  Sonographer:    Carlos American RVT, RDCS (AE), RDMS Referring Phys: [id number] KARDIE TOBB  Sonographer Comments: Patient is obese. Image acquisition challenging due to patient body habitus and pendelous, large breasts. IMPRESSIONS  1. Left ventricular ejection fraction, by estimation, is 55 to 60%. The left ventricle has normal function. The left ventricle has no regional wall motion abnormalities. Left ventricular diastolic parameters were normal.  2. Right ventricular systolic function is normal. The right ventricular size is normal. Tricuspid regurgitation signal is inadequate for assessing PA pressure.  3. The mitral valve is normal in structure. No evidence of mitral  valve regurgitation. No evidence of mitral stenosis.  4. The aortic valve has an indeterminant number of cusps. Aortic valve regurgitation is not visualized. No aortic stenosis is present.  5. The inferior vena cava is normal in size with greater than 50% respiratory variability, suggesting right atrial pressure of 3 mmHg. FINDINGS  Left Ventricle: Left ventricular ejection fraction, by estimation, is 55 to 60%. The left ventricle has normal function. The left ventricle has no regional wall motion abnormalities. The left ventricular internal cavity size was normal in size. There is  no left ventricular hypertrophy. Left ventricular diastolic parameters were normal. Right Ventricle: The right ventricular size is normal. No increase in right ventricular wall thickness. Right  ventricular systolic function is normal. Tricuspid regurgitation signal is inadequate for assessing PA pressure. Left Atrium: Left atrial size was normal in size. Right Atrium: Right atrial size was normal in size. Pericardium: There is no evidence of pericardial effusion. Mitral Valve: The mitral valve is normal in structure. No evidence of mitral valve regurgitation. No evidence of mitral valve stenosis. Tricuspid Valve: The tricuspid valve is normal in structure. Tricuspid valve regurgitation is not demonstrated. No evidence of tricuspid stenosis. Aortic Valve: The aortic valve has an indeterminant number of cusps. Aortic valve regurgitation is not visualized. No aortic stenosis is present. Aortic valve mean gradient measures 4.0 mmHg. Aortic valve peak gradient measures 7.3 mmHg. Aortic valve area, by VTI measures 1.56 cm. Pulmonic Valve: The pulmonic valve was normal in structure. Pulmonic valve regurgitation is mild. No evidence of pulmonic stenosis. Aorta: The aortic root is normal in size and structure. Venous: The inferior vena cava is normal in size with greater than 50% respiratory variability, suggesting right atrial pressure of 3 mmHg. IAS/Shunts: No atrial level shunt detected by color flow Doppler.  LEFT VENTRICLE PLAX 2D LVIDd:         4.10 cm   Diastology LVIDs:         2.70 cm   LV e' medial:    7.26 cm/s LV PW:         0.90 cm   LV E/e' medial:  14.7 LV IVS:        1.00 cm   LV e' lateral:   13.40 cm/s LVOT diam:     1.90 cm   LV E/e' lateral: 8.0 LV SV:         41 LV SV Index:   17 LVOT Area:     2.84 cm  RIGHT VENTRICLE RV S prime:     16.40 cm/s TAPSE (M-mode): 1.9 cm LEFT ATRIUM             Index        RIGHT ATRIUM           Index LA diam:        3.20 cm 1.32 cm/m   RA Area:     10.20 cm LA Vol (A2C):   54.6 ml 22.52 ml/m  RA Volume:   17.20 ml  7.10 ml/m LA Vol (A4C):   32.1 ml 13.24 ml/m LA Biplane Vol: 44.7 ml 18.44 ml/m  AORTIC VALVE                    PULMONIC VALVE AV Area  (Vmax):    1.90 cm     PV Vmax:          1.03 m/s AV Area (Vmean):   1.74 cm     PV Peak grad:  4.2 mmHg AV Area (VTI):     1.56 cm     PR End Diast Vel: 3.02 msec AV Vmax:           135.00 cm/s AV Vmean:          97.100 cm/s AV VTI:            0.262 m AV Peak Grad:      7.3 mmHg AV Mean Grad:      4.0 mmHg LVOT Vmax:         90.70 cm/s LVOT Vmean:        59.700 cm/s LVOT VTI:          0.144 m LVOT/AV VTI ratio: 0.55  AORTA Ao Root diam: 2.70 cm Ao Asc diam:  2.90 cm Ao Arch diam: 2.8 cm MITRAL VALVE MV Area (PHT): 5.79 cm     SHUNTS MV Decel Time: 131 msec     Systemic VTI:  0.14 m MV E velocity: 107.00 cm/s  Systemic Diam: 1.90 cm MV A velocity: 84.60 cm/s MV E/A ratio:  1.26 Julien Nordmann MD Electronically signed by Julien Nordmann MD Signature Date/Time: 12/07/2022/5:22:03 PM    Final     MAU Management/MDM: Orders Placed This Encounter  Procedures  . CBC  . Comprehensive metabolic panel  . Urinalysis, Routine w reflex microscopic -Urine, Clean Catch  . Rapid urine drug screen (hospital performed)  . Protein / creatinine ratio, urine  . Orthostatic vital signs  . EKG 12-Lead    Meds ordered this encounter  Medications  . acetaminophen (TYLENOL) tablet 1,000 mg  . diphenhydrAMINE (BENADRYL) capsule 25 mg  . promethazine (PHENERGAN) tablet 25 mg  . cyclobenzaprine (FLEXERIL) tablet 10 mg  . lactated ringers bolus 1,000 mL     Available prenatal records reviewed.  Patient presents with severe headache and near syncopal event. Suspect vasovagal in nature by history. Will obtain work-up: CBC, CMP, UA, ethanol, UDS, orthostats, EKG. Additionally treat headache with above medications. Will also obtain P:C to assess for atypical preE.  ***  ASSESSMENT 1. Supervision of other normal pregnancy, antepartum     PLAN Discharge home with strict return precautions. Allergies as of 12/12/2022       Reactions   Other Shortness Of Breath   Dust, Reports smells,perfumes,aerosols HX  asthma   Latex Rash     Med Rec must be completed prior to using this [facility]***        Wylene Simmer, MD OB Fellow 12/12/2022  11:53 PM

## 2022-12-12 NOTE — MAU Note (Signed)
Pt says she has had H/A since Friday- 6/10 Now - 10/10 Meds yesterday - Tyl- but no relief. Today - no meds for H/A Says vision dark  at 6pm- unsure if she passed out.  Then home - happened again . Called nurse - told to come here - on way - husband stopped at parking lot grocery store - . EMS - checked BP- slight high , blood sugar- NL. Now- head hurts on forehead,  behind eyes feels pressure , sometimes blurry vision. No epigastric pain. Both ears hurt.

## 2022-12-12 NOTE — MAU Provider Note (Signed)
MAU Provider Note  Chief Complaint: Headache   Event Date/Time   First Provider Initiated Contact with Patient 12/12/22 2220      SUBJECTIVE HPI: Emily Golden is a 22 y.o. G4P0030 at [redacted]w[redacted]d by LMP who presents to maternity admissions reporting severe HA, possible syncopal event. Pregnancy c/b bipolar 2, obesity, tobacco use, fetal tetralogy of Fallot. Receives [facility] with [facility].  Patient notes feeling fine earlier in the day. Went to a birthday party and started to feel lightheaded. Remembers vision darkening. Did not fall. Family notes her face started to turn purple and her lips were pale. Has had a 10/10 headache since. Mostly in temples. Sensitive to light/noise. Denies hx of migraines. Has passed out before with gallbladder pain early in pregnancy. Has seen OB cards and had normal echo. Notes increased congestion, frontal headaches for past few days as well.  +FM. Denies regular ctx, VB, LOF.   EMS noted slightly elevated BP.  HPI  Past Medical History:  Diagnosis Date   Allergy    Anxiety    Asthma    Bipolar 1 disorder (HCC)    Gallstones    Past Surgical History:  Procedure Laterality Date   compound fracture right arm Right    NASAL RECONSTRUCTION     Social History   Socioeconomic History   Marital status: Married    Spouse name: Not on file   Number of children: Not on file   Years of education: Not on file   Highest education level: Not on file  Occupational History   Not on file  Tobacco Use   Smoking status: Former    Current packs/day: 0.00    Types: Cigarettes    Quit date: 04/09/2018    Years since quitting: 4.6    Passive exposure: Past   Smokeless tobacco: Never   Tobacco comments:    Stopped smoking 3 months before she got pregnant  Vaping Use   Vaping status: Former  Substance and Sexual Activity   Alcohol use: Not Currently   Drug use: Not Currently   Sexual activity: Yes  Other Topics Concern   Not on file  Social History Narrative   Is in  10th grade at [employer]   Social Determinants of Health   Financial Resource Strain: Not on file  Food Insecurity: No Food Insecurity (09/22/2021)   Hunger Vital Sign    Worried About Running Out of Food in the Last Year: Never true    Ran Out of Food in the Last Year: Never true  Transportation Needs: No Transportation Needs (09/22/2021)   PRAPARE - Administrator, Civil Service (Medical): No    Lack of Transportation (Non-Medical): No  Physical Activity: Not on file  Stress: Not on file  Social Connections: Not on file  Intimate Partner Violence: Not on file   No current facility-administered medications on file prior to encounter.   Current Outpatient Medications on File Prior to Encounter  Medication Sig Dispense Refill   acetaminophen (TYLENOL) 500 MG tablet Take 2 tablets (1,000 mg total) by mouth every 6 (six) hours as needed for mild pain.     hydrOXYzine (ATARAX) 25 MG tablet Take 2 tablets (50 mg total) by mouth 3 (three) times daily. 60 tablet 2   Prenatal Vit-Fe Fumarate-FA (MULTIVITAMIN-PRENATAL) 27-0.8 MG TABS tablet Take 1 tablet by mouth daily at 12 noon.     propranolol (INDERAL) 10 MG tablet Take 1 tablet (10 mg total) by mouth at  bedtime. 90 tablet 3   ALBUTEROL IN Inhale 1-2 puffs into the lungs as needed.     beclomethasone (QVAR) 80 MCG/ACT inhaler Inhale 2 puffs into the lungs daily as needed (for shortenss of breath).     clotrimazole-betamethasone (LOTRISONE) cream Apply 1 Application topically 2 (two) times daily. (Patient not taking: Reported on 12/12/2022) 30 g 0   EPINEPHrine 0.3 mg/0.3 mL IJ SOAJ injection Inject 0.3 mLs into the muscle as needed. (Patient not taking: Reported on 12/12/2022)     nystatin-triamcinolone ointment (MYCOLOG) Apply 1 Application topically 2 (two) times daily as needed. (Patient not taking: Reported on 12/12/2022) 30 g 1   terconazole (TERAZOL 7) 0.4 % vaginal cream Place 1 applicator vaginally at  bedtime. (Patient not taking: Reported on 12/12/2022) 45 g 0   Allergies  Allergen Reactions   Other Shortness Of Breath    Dust, Reports smells,perfumes,aerosols HX asthma    Latex Rash    ROS:  Pertinent positives/negatives listed above.  I have reviewed patient's Past Medical Hx, Surgical Hx, Family Hx, Social Hx, medications and allergies.   Physical Exam  Patient Vitals for the past 24 hrs:  BP Temp Temp src Pulse Resp SpO2 Height Weight  12/12/22 2230 (!) 113/55 -- -- 94 -- 100 % -- --  12/12/22 2206 125/77 97.9 F (36.6 C) Oral 85 16 -- 5' 7.5" (1.715 m) (!) 140 kg   Constitutional: Well-developed, well-nourished female. Appears uncomfortable Cardiovascular: normal rate, regular rhythm, no m/r/g Respiratory: normal effort, CTAB GI: Abd soft, non-tender, gravid MS: Extremities nontender, trace edema, normal ROM Neurologic: Alert and oriented x 4. No gross deficits  FHT:  Baseline 120 , moderate variability, accelerations present, no decelerations Contractions: none  LAB RESULTS Results for orders placed or performed during the hospital encounter of 12/12/22 (from the past 24 hour(s))  Urinalysis, Routine w reflex microscopic -Urine, Clean Catch     Status: Abnormal   Collection Time: 12/12/22  9:57 PM  Result Value Ref Range   Color, Urine YELLOW YELLOW   APPearance HAZY (A) CLEAR   Specific Gravity, Urine 1.014 1.005 - 1.030   pH 6.0 5.0 - 8.0   Glucose, UA NEGATIVE NEGATIVE mg/dL   Hgb urine dipstick NEGATIVE NEGATIVE   Bilirubin Urine NEGATIVE NEGATIVE   Ketones, ur NEGATIVE NEGATIVE mg/dL   Protein, ur NEGATIVE NEGATIVE mg/dL   Nitrite NEGATIVE NEGATIVE   Leukocytes,Ua SMALL (A) NEGATIVE   RBC / HPF 0-5 0 - 5 RBC/hpf   WBC, UA 6-10 0 - 5 WBC/hpf   Bacteria, UA RARE (A) NONE SEEN   Squamous Epithelial / HPF 11-20 0 - 5 /HPF   Mucus PRESENT   Rapid urine drug screen (hospital performed)     Status: None   Collection Time: 12/12/22  9:57 PM  Result Value  Ref Range   Opiates NONE DETECTED NONE DETECTED   Cocaine NONE DETECTED NONE DETECTED   Benzodiazepines NONE DETECTED NONE DETECTED   Amphetamines NONE DETECTED NONE DETECTED   Tetrahydrocannabinol NONE DETECTED NONE DETECTED   Barbiturates NONE DETECTED NONE DETECTED  Protein / creatinine ratio, urine     Status: None   Collection Time: 12/12/22  9:57 PM  Result Value Ref Range   Creatinine, Urine 108 mg/dL   Total Protein, Urine 9 mg/dL   Protein Creatinine Ratio 0.08 0.00 - 0.15 mg/mg[Cre]  CBC     Status: Abnormal   Collection Time: 12/12/22 10:45 PM  Result Value Ref Range   WBC 10.9 (  H) 4.0 - 10.5 K/uL   RBC 4.43 3.87 - 5.11 MIL/uL   Hemoglobin 12.2 12.0 - 15.0 g/dL   HCT 82.9 56.2 - 13.0 %   MCV 84.7 80.0 - 100.0 fL   MCH 27.5 26.0 - 34.0 pg   MCHC 32.5 30.0 - 36.0 g/dL   RDW 86.5 78.4 - 69.6 %   Platelets 302 150 - 400 K/uL   nRBC 0.0 0.0 - 0.2 %  Comprehensive metabolic panel     Status: Abnormal   Collection Time: 12/12/22 10:45 PM  Result Value Ref Range   Sodium 134 (L) 135 - 145 mmol/L   Potassium 3.9 3.5 - 5.1 mmol/L   Chloride 107 98 - 111 mmol/L   CO2 19 (L) 22 - 32 mmol/L   Glucose, Bld 91 70 - 99 mg/dL   BUN 7 6 - 20 mg/dL   Creatinine, Ser 2.95 0.44 - 1.00 mg/dL   Calcium 9.1 8.9 - 28.4 mg/dL   Total Protein 6.1 (L) 6.5 - 8.1 g/dL   Albumin 2.5 (L) 3.5 - 5.0 g/dL   AST 16 15 - 41 U/L   ALT 10 0 - 44 U/L   Alkaline Phosphatase 140 (H) 38 - 126 U/L   Total Bilirubin 0.3 <1.2 mg/dL   GFR, Estimated >13 >24 mL/min   Anion gap 8 5 - 15      EKG interpretation: HR 70. Sinus arrhythmia, axis, intervals. No ST changes  IMAGING ECHOCARDIOGRAM COMPLETE  Result Date: 12/07/2022    ECHOCARDIOGRAM REPORT   Patient Name:   TYJAH MULCARE Date of Exam: 12/06/2022 Medical Rec #:  [id number]   Height:       67.0 in Accession #:    [id number]  Weight:       306.4 lb Date of Birth:  03-12-2000  BSA:          2.424 m Patient Age:    22 years    BP:           114/86  mmHg Patient Gender: F           HR:           99 bpm. Exam Location:  [place] Procedure: 2D Echo, Cardiac Doppler and Color Doppler Indications:    R06.02 SOB; R60.0 Lower extremity edema  History:        Patient has no prior history of Echocardiogram examinations.                 Signs/Symptoms:Shortness of Breath and Edema; Risk                 Factors:Former Smoker. Patient denies chest pain. She does have                 SOB with leg edema. Patient is [redacted] weeks pregnant, fetus has                 Tetralogy of Fallot.  Sonographer:    Carlos American RVT, RDCS (AE), RDMS Referring Phys: [id number] KARDIE TOBB  Sonographer Comments: Patient is obese. Image acquisition challenging due to patient body habitus and pendelous, large breasts. IMPRESSIONS  1. Left ventricular ejection fraction, by estimation, is 55 to 60%. The left ventricle has normal function. The left ventricle has no regional wall motion abnormalities. Left ventricular diastolic parameters were normal.  2. Right ventricular systolic function is normal. The right ventricular size is normal. Tricuspid regurgitation signal is inadequate for assessing PA pressure.  3.  The mitral valve is normal in structure. No evidence of mitral valve regurgitation. No evidence of mitral stenosis.  4. The aortic valve has an indeterminant number of cusps. Aortic valve regurgitation is not visualized. No aortic stenosis is present.  5. The inferior vena cava is normal in size with greater than 50% respiratory variability, suggesting right atrial pressure of 3 mmHg. FINDINGS  Left Ventricle: Left ventricular ejection fraction, by estimation, is 55 to 60%. The left ventricle has normal function. The left ventricle has no regional wall motion abnormalities. The left ventricular internal cavity size was normal in size. There is  no left ventricular hypertrophy. Left ventricular diastolic parameters were normal. Right Ventricle: The right ventricular size is normal. No increase  in right ventricular wall thickness. Right ventricular systolic function is normal. Tricuspid regurgitation signal is inadequate for assessing PA pressure. Left Atrium: Left atrial size was normal in size. Right Atrium: Right atrial size was normal in size. Pericardium: There is no evidence of pericardial effusion. Mitral Valve: The mitral valve is normal in structure. No evidence of mitral valve regurgitation. No evidence of mitral valve stenosis. Tricuspid Valve: The tricuspid valve is normal in structure. Tricuspid valve regurgitation is not demonstrated. No evidence of tricuspid stenosis. Aortic Valve: The aortic valve has an indeterminant number of cusps. Aortic valve regurgitation is not visualized. No aortic stenosis is present. Aortic valve mean gradient measures 4.0 mmHg. Aortic valve peak gradient measures 7.3 mmHg. Aortic valve area, by VTI measures 1.56 cm. Pulmonic Valve: The pulmonic valve was normal in structure. Pulmonic valve regurgitation is mild. No evidence of pulmonic stenosis. Aorta: The aortic root is normal in size and structure. Venous: The inferior vena cava is normal in size with greater than 50% respiratory variability, suggesting right atrial pressure of 3 mmHg. IAS/Shunts: No atrial level shunt detected by color flow Doppler.  LEFT VENTRICLE PLAX 2D LVIDd:         4.10 cm   Diastology LVIDs:         2.70 cm   LV e' medial:    7.26 cm/s LV PW:         0.90 cm   LV E/e' medial:  14.7 LV IVS:        1.00 cm   LV e' lateral:   13.40 cm/s LVOT diam:     1.90 cm   LV E/e' lateral: 8.0 LV SV:         41 LV SV Index:   17 LVOT Area:     2.84 cm  RIGHT VENTRICLE RV S prime:     16.40 cm/s TAPSE (M-mode): 1.9 cm LEFT ATRIUM             Index        RIGHT ATRIUM           Index LA diam:        3.20 cm 1.32 cm/m   RA Area:     10.20 cm LA Vol (A2C):   54.6 ml 22.52 ml/m  RA Volume:   17.20 ml  7.10 ml/m LA Vol (A4C):   32.1 ml 13.24 ml/m LA Biplane Vol: 44.7 ml 18.44 ml/m  AORTIC VALVE                     PULMONIC VALVE AV Area (Vmax):    1.90 cm     PV Vmax:          1.03 m/s AV Area (Vmean):  1.74 cm     PV Peak grad:     4.2 mmHg AV Area (VTI):     1.56 cm     PR End Diast Vel: 3.02 msec AV Vmax:           135.00 cm/s AV Vmean:          97.100 cm/s AV VTI:            0.262 m AV Peak Grad:      7.3 mmHg AV Mean Grad:      4.0 mmHg LVOT Vmax:         90.70 cm/s LVOT Vmean:        59.700 cm/s LVOT VTI:          0.144 m LVOT/AV VTI ratio: 0.55  AORTA Ao Root diam: 2.70 cm Ao Asc diam:  2.90 cm Ao Arch diam: 2.8 cm MITRAL VALVE MV Area (PHT): 5.79 cm     SHUNTS MV Decel Time: 131 msec     Systemic VTI:  0.14 m MV E velocity: 107.00 cm/s  Systemic Diam: 1.90 cm MV A velocity: 84.60 cm/s MV E/A ratio:  1.26 Julien Nordmann MD Electronically signed by Julien Nordmann MD Signature Date/Time: 12/07/2022/5:22:03 PM    Final     MAU Management/MDM: Orders Placed This Encounter  Procedures   CBC   Comprehensive metabolic panel   Urinalysis, Routine w reflex microscopic -Urine, Clean Catch   Rapid urine drug screen (hospital performed)   Protein / creatinine ratio, urine   Orthostatic vital signs   EKG 12-Lead    Meds ordered this encounter  Medications   acetaminophen (TYLENOL) tablet 1,000 mg   diphenhydrAMINE (BENADRYL) capsule 25 mg   promethazine (PHENERGAN) tablet 25 mg   cyclobenzaprine (FLEXERIL) tablet 10 mg   lactated ringers bolus 1,000 mL     Available prenatal records reviewed.  Patient presents with severe headache and near syncopal event. Suspect vasovagal in nature by history. Will obtain work-up: CBC, CMP, UA, ethanol, UDS, orthostats, EKG. Additionally treat headache with above medications. Will also obtain P:C to assess for atypical preE.  0017: Patient's headache markedly improved to 5/10. Discussed that lab results, EKG are reassuring, and that overall picture is consistent with vasovagal syncope. Urine is indicative of early UTI and also has possible beginnings of  sinusitis. Will treat both with augmentin. Urine culture pending. Babe's baseline decreased to 100-105 on monitoring, otherwise excellent variability and accels. Dr. Vergie Living called to review strip.  1610: Update from Dr. Vergie Living. Spoke to MFM who recommends extended monitoring and possible IOL given fetal cardiac abnormalities. He will facilitate transfer to [facility] where patient is scheduled to deliver.  ASSESSMENT 1. Vasovagal syncope   2. Acute non intractable tension-type headache   3. Fetal bradycardia, antepartum condition or complication   4. 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 [redacted] weeks gestation of pregnancy     PLAN Transfer to [facility].  Wylene Simmer, MD OB Fellow 12/13/2022  12:39 AM

## 2022-12-13 ENCOUNTER — Encounter (HOSPITAL_COMMUNITY): Payer: Self-pay | Admitting: Obstetrics and Gynecology

## 2022-12-13 DIAGNOSIS — O99353 Diseases of the nervous system complicating pregnancy, third trimester: Secondary | ICD-10-CM | POA: Diagnosis not present

## 2022-12-13 DIAGNOSIS — O35BXX0 Maternal care for other (suspected) fetal abnormality and damage, fetal cardiac anomalies, not applicable or unspecified: Secondary | ICD-10-CM | POA: Diagnosis not present

## 2022-12-13 DIAGNOSIS — G44209 Tension-type headache, unspecified, not intractable: Secondary | ICD-10-CM | POA: Diagnosis present

## 2022-12-13 DIAGNOSIS — Z3A38 38 weeks gestation of pregnancy: Secondary | ICD-10-CM | POA: Diagnosis not present

## 2022-12-13 DIAGNOSIS — R55 Syncope and collapse: Secondary | ICD-10-CM | POA: Diagnosis not present

## 2022-12-13 DIAGNOSIS — O368330 Maternal care for abnormalities of the fetal heart rate or rhythm, third trimester, not applicable or unspecified: Secondary | ICD-10-CM | POA: Diagnosis not present

## 2022-12-13 MED ORDER — AMOXICILLIN-POT CLAVULANATE 875-125 MG PO TABS: 1.0000 | ORAL_TABLET | Freq: Two times a day (BID) | ORAL | 0 refills | Status: DC

## 2022-12-13 MED ORDER — AMOXICILLIN-POT CLAVULANATE 875-125 MG PO TABS
1.0000 | ORAL_TABLET | Freq: Once | ORAL | Status: DC
  Filled 2022-12-13: qty 1

## 2022-12-13 NOTE — Progress Notes (Signed)
Report called to [facility] triage RN at [facility].

## 2022-12-13 NOTE — Progress Notes (Signed)
Pt transported out by [facility].

## 2022-12-14 LAB — CULTURE, OB URINE
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
## 2023-01-03 ENCOUNTER — Encounter: Payer: Self-pay | Admitting: Family Medicine

## 2023-01-16 ENCOUNTER — Other Ambulatory Visit: Payer: Self-pay

## 2023-01-16 ENCOUNTER — Ambulatory Visit: Payer: MEDICAID | Admitting: Obstetrics and Gynecology

## 2023-01-16 ENCOUNTER — Encounter: Payer: Self-pay | Admitting: Obstetrics and Gynecology

## 2023-01-16 VITALS — BP 116/78 | HR 69 | Wt 299.4 lb

## 2023-01-16 DIAGNOSIS — L0591 Pilonidal cyst without abscess: Secondary | ICD-10-CM | POA: Diagnosis not present

## 2023-01-16 NOTE — Progress Notes (Signed)
   CC: back mass Subjective:    Patient ID: Emily Golden, female    DOB: 12/27/2000, 23 y.o.   MRN: [id number]  HPI 23 yo H5E8968 seen for discussion of lower back mass.  PT delivered vaginally at [facility] due to fetal heart defect.  Pt did receive epidural which seemed uncomplicated.  She delivered on 12/20/22.  Per pt on 12/29/22 she noted a new onset lower back mass.  It was soft but tender to the point standing and sometimes walking was uncomfortable. Pt has to lie on her side to be comfortable.  Pt denies drainage and erythema.  The mass was larger previously but then decreased in size.   Review of Systems     Objective:   Physical Exam Vitals:   01/16/23 1545  BP: 116/78  Pulse: 69   3.5 x 5 cm nonpigmented soft mass in central lower back above the gluteal fold.  No erythema.  Mild tenderness on palpation.      Assessment & Plan:   1. Pilonidal cyst without abscess (Primary) I do not believe the mass is related to any delivery trauma or epidural.  Mass is c/w pilonidal cyst/abscess.  Will refer to general surgery for further management/treatment.  OTC pain control as needed. - Ambulatory referral to General Surgery    Jerilynn DELENA Buddle, MD Faculty Attending, [facility]

## 2023-01-17 ENCOUNTER — Telehealth: Payer: Self-pay

## 2023-01-17 NOTE — Telephone Encounter (Signed)
 [facility], spoke with Careshia to see if they received Referral for Pt, they advised they did not. They asked if it could be faxed, so sent request to front desk. She advised once they receive request that they will call Pt directly to make appointment.

## 2023-01-25 ENCOUNTER — Ambulatory Visit: Payer: MEDICAID | Admitting: Family Medicine

## 2023-02-11 ENCOUNTER — Other Ambulatory Visit: Payer: Self-pay

## 2023-02-11 ENCOUNTER — Emergency Department (HOSPITAL_COMMUNITY): Payer: MEDICAID

## 2023-02-11 ENCOUNTER — Emergency Department (HOSPITAL_COMMUNITY)
Admission: EM | Admit: 2023-02-11 | Discharge: 2023-02-11 | Disposition: A | Discharge status: Home / Self Care | Payer: MEDICAID | Attending: Emergency Medicine | Admitting: Emergency Medicine

## 2023-02-11 ENCOUNTER — Encounter (HOSPITAL_COMMUNITY): Payer: Self-pay | Admitting: *Deleted

## 2023-02-11 DIAGNOSIS — R110 Nausea: Secondary | ICD-10-CM | POA: Diagnosis not present

## 2023-02-11 DIAGNOSIS — R42 Dizziness and giddiness: Secondary | ICD-10-CM | POA: Diagnosis not present

## 2023-02-11 DIAGNOSIS — R0602 Shortness of breath: Secondary | ICD-10-CM | POA: Insufficient documentation

## 2023-02-11 DIAGNOSIS — R079 Chest pain, unspecified: Secondary | ICD-10-CM

## 2023-02-11 HISTORY — DX: Supraventricular tachycardia, unspecified: I47.10

## 2023-02-11 LAB — HEPATIC FUNCTION PANEL
ALT: 19 U/L (ref 0–44)
AST: 24 U/L (ref 15–41)
Albumin: 3 g/dL — ABNORMAL LOW (ref 3.5–5.0)
Alkaline Phosphatase: 102 U/L (ref 38–126)
Bilirubin, Direct: 0.2 mg/dL (ref 0.0–0.2)
Indirect Bilirubin: 0.4 mg/dL (ref 0.3–0.9)
Total Bilirubin: 0.6 mg/dL (ref 0.0–1.2)
Total Protein: 6.2 g/dL — ABNORMAL LOW (ref 6.5–8.1)

## 2023-02-11 LAB — URINALYSIS, ROUTINE W REFLEX MICROSCOPIC
Bacteria, UA: NONE SEEN
Bilirubin Urine: NEGATIVE
Glucose, UA: NEGATIVE mg/dL
Hgb urine dipstick: NEGATIVE
Ketones, ur: NEGATIVE mg/dL
Nitrite: NEGATIVE
Protein, ur: NEGATIVE mg/dL
Specific Gravity, Urine: 1.017 (ref 1.005–1.030)
pH: 6 (ref 5.0–8.0)

## 2023-02-11 LAB — CBC
HCT: 38.3 % (ref 36.0–46.0)
Hemoglobin: 11.9 g/dL — ABNORMAL LOW (ref 12.0–15.0)
MCH: 26.2 pg (ref 26.0–34.0)
MCHC: 31.1 g/dL (ref 30.0–36.0)
MCV: 84.4 fL (ref 80.0–100.0)
Platelets: 296 10*3/uL (ref 150–400)
RBC: 4.54 MIL/uL (ref 3.87–5.11)
RDW: 13.3 % (ref 11.5–15.5)
WBC: 8.3 10*3/uL (ref 4.0–10.5)
nRBC: 0 % (ref 0.0–0.2)

## 2023-02-11 LAB — TROPONIN I (HIGH SENSITIVITY)
Troponin I (High Sensitivity): <2 ng/L (ref <18)
Troponin I (High Sensitivity): <2 ng/L (ref <18)

## 2023-02-11 LAB — BASIC METABOLIC PANEL
Anion gap: 9 (ref 5–15)
BUN: 16 mg/dL (ref 6–20)
CO2: 24 mmol/L (ref 22–32)
Calcium: 9.1 mg/dL (ref 8.9–10.3)
Chloride: 107 mmol/L (ref 98–111)
Creatinine, Ser: 0.68 mg/dL (ref 0.44–1.00)
GFR, Estimated: >60 mL/min (ref >60)
Glucose, Bld: 80 mg/dL (ref 70–99)
Potassium: 4 mmol/L (ref 3.5–5.1)
Sodium: 140 mmol/L (ref 135–145)

## 2023-02-11 LAB — HCG, SERUM, QUALITATIVE: Preg, Serum: NEGATIVE

## 2023-02-11 LAB — D-DIMER, QUANTITATIVE: D-Dimer, Quant: 0.81 ug{FEU}/mL — ABNORMAL HIGH (ref 0.00–0.50)

## 2023-02-11 MED ORDER — IOHEXOL 350 MG/ML SOLN
75.0000 mL | Freq: Once | INTRAVENOUS | Status: AC | PRN
  Administered 2023-02-11: 75 mL via INTRAVENOUS

## 2023-02-11 NOTE — ED Provider Notes (Signed)
 [facility] DEPT [facility] Emergency Department Provider Note MRN:  [id number]  Arrival date & time: 02/12/23     Chief Complaint   Chest Pain   History of Present Illness   Emily Golden is a 23 y.o. year-old female presents to the ED with chief complaint of chest pain.  She states that she had 3 episodes, 1900, 2100, 0100.  States that symptoms lasted about 15-30 minutes.  Denies cough or fever.  States that she felt short of breath and had nausea and dizziness.  States that she still feels a little lightheaded and had some pressure on her chest.   Hx of SVT, diagnosed 7 weeks ago after she had a baby (vaginal delivery), no troubles with delivery.  Son has Marine [employer].  She delivered at [facility].  Takes propranolol .  History provided by patient.   Review of Systems  Pertinent positive and negative review of systems noted in HPI.    Physical Exam   Vitals:   02/11/23 0700 02/11/23 0739  BP: 128/63 136/60  Pulse: (!) 56 (!) 54  Resp: (!) 21 18  Temp:  98.2 F (36.8 C)  SpO2: 100% 100%    CONSTITUTIONAL:  Non toxic-appearing, NAD NEURO:  Alert and oriented x 3, CN 3-12 grossly intact EYES:  eyes equal and reactive ENT/NECK:  Supple, no stridor  CARDIO:  normal rate, regular rhythm, appears well-perfused  PULM:  No respiratory distress, CTAB GI/GU:  non-distended,  MSK/SPINE:  No gross deformities, no edema, moves all extremities  SKIN:  no rash, atraumatic   *Additional and/or pertinent findings included in MDM below  Diagnostic and Interventional Summary    EKG Interpretation Date/Time:  Saturday February 11 2023 02:40:00 EST Ventricular Rate:  73 PR Interval:  164 QRS Duration:  76 QT Interval:  368 QTC Calculation: 405 R Axis:   21  Text Interpretation: Normal sinus rhythm with sinus arrhythmia Possible Anterior infarct , age undetermined Abnormal ECG When compared with ECG of 12-Dec-2022 23:21, No significant change since last tracing Confirmed by  Jerrol Agent ([id number]) on 02/11/2023 3:48:24 AM       Labs Reviewed  CBC - Abnormal; Notable for the following components:      Result Value   Hemoglobin 11.9 (*)    All other components within normal limits  D-DIMER, QUANTITATIVE - Abnormal; Notable for the following components:   D-Dimer, Quant 0.81 (*)    All other components within normal limits  HEPATIC FUNCTION PANEL - Abnormal; Notable for the following components:   Total Protein 6.2 (*)    Albumin 3.0 (*)    All other components within normal limits  URINALYSIS, ROUTINE W REFLEX MICROSCOPIC - Abnormal; Notable for the following components:   APPearance HAZY (*)    Leukocytes,Ua SMALL (*)    All other components within normal limits  BASIC METABOLIC PANEL  HCG, SERUM, QUALITATIVE  TROPONIN I (HIGH SENSITIVITY)  TROPONIN I (HIGH SENSITIVITY)    CT Angio Chest PE W and/or Wo Contrast  Final Result    DG Chest 2 View  Final Result      Medications  iohexol  (OMNIPAQUE ) 350 MG/ML injection 75 mL (75 mLs Intravenous Contrast Given 02/11/23 0529)     Procedures  /  Critical Care Procedures  ED Course and Medical Decision Making  I have reviewed the triage vital signs, the nursing notes, and pertinent available records from the EMR.  Social Determinants Affecting Complexity of Care: Patient has no clinically significant social determinants affecting this  chief complaint..   ED Course: Clinical Course as of 02/12/23 2201  Sat Feb 11, 2023  0617 Urinalysis, Routine w reflex microscopic -Urine, Clean Catch(!) No protein [RB]  0617 Hepatic function panel(!) Normal LFTs [RB]  0618 D-dimer, quantitative(!) D-dimer is slightly elevated, will check PE study given that patient is on propranolol  and could have decreased heart rate because of this. [RB]  0618 Basic metabolic panel No significant electrolyte derangement [RB]  0618 Troponin I (High Sensitivity) Troponin is negative.  Doubt ACS. [RB]  0700 Low risk chest pain.  On propanolol, 3 episodes of cp beginning last night lasting 15-53mins. No active chest pain. Seems anxious. PE Study negative --> discharge home.  [CG]    Clinical Course User Index [CG] Ruthell Lonni FALCON, PA-C [RB] Vicky Charleston, PA-C    Medical Decision Making Patient here with 3 brief episodes of chest pain that occurred last night.  Each lasted for 15-30 minutes and then resolved.  No associated trauma.  No cough or fever.  Trops are negative, doubt ACS.  D-dimer was slightly elevated.  PE study pending, but anticipate that it will be negative and patient would then be able to go home and follow-up with PCP.  Amount and/or Complexity of Data Reviewed Labs: ordered. Decision-making details documented in ED Course. Radiology: ordered.  Risk Prescription drug management.         Consultants: No consultations were needed in caring for this patient.   Treatment and Plan: Patient signed out to oncoming team, who will follow-up on CT imaging.    Final Clinical Impressions(s) / ED Diagnoses     ICD-10-CM   1. Chest pain, unspecified type  R07.9       ED Discharge Orders     None         Discharge Instructions Discussed with and Provided to Patient:     Discharge Instructions      It was a pleasure taking part in your care.  As we discussed, you workup is reassuring.  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  Please have additional CT scan performed in 3 months to ensure stability of the stomach remnant.  Follow-up with your PCP.  Please return to the ED with any new or worsening symptoms.       Vicky Charleston, PA-C 02/12/23 2201    Jerrol Agent, MD 02/12/23 [phone]

## 2023-02-11 NOTE — ED Triage Notes (Signed)
 The pt has a hx of svt 7 months ago when she delivered her son  she has been placed on inderal    tonight the chest pain and rapid heart rate around 1900  2100 and at 0100am   that came and went  lmp 2 days ago

## 2023-02-11 NOTE — ED Notes (Signed)
 Pt states unable to urinate at this time.

## 2023-02-11 NOTE — ED Provider Notes (Signed)
  Physical Exam  BP 128/63 (BP Location: Right Arm)   Pulse (!) 56   Temp 98 F (36.7 C) (Oral)   Resp (!) 21   Ht 5' 7 (1.702 m)   Wt 135.8 kg   LMP 02/09/2023   SpO2 100%   Breastfeeding Unknown   BMI 46.89 kg/m   Physical Exam Vitals and nursing note reviewed.  Constitutional:      General: She is not in acute distress.    Appearance: She is not toxic-appearing.  HENT:     Head: Normocephalic and atraumatic.  Pulmonary:     Effort: No respiratory distress.  Skin:    Coloration: Skin is not jaundiced or pale.  Neurological:     Mental Status: She is alert and oriented to person, place, and time.  Psychiatric:        Behavior: Behavior normal.     Procedures  Procedures  ED Course / MDM   Clinical Course as of 02/11/23 0726  Sat Feb 11, 2023  0617 Urinalysis, Routine w reflex microscopic -Urine, Clean Catch(!) No protein [RB]  0617 Hepatic function panel(!) Normal LFTs [RB]  0618 D-dimer, quantitative(!) D-dimer is slightly elevated, will check PE study given that patient is on propranolol  and could have decreased heart rate because of this. [RB]  0618 Basic metabolic panel No significant electrolyte derangement [RB]  0618 Troponin I (High Sensitivity) Troponin is negative.  Doubt ACS. [RB]  0700 Low risk chest pain. On propanolol, 3 episodes of cp beginning last night lasting 15-58mins. No active chest pain. Seems anxious. PE Study negative --> discharge home.  [CG]    Clinical Course User Index [CG] Ruthell Lonni FALCON, PA-C [RB] Vicky Charleston, PA-C   Medical Decision Making Amount and/or Complexity of Data Reviewed Labs: ordered. Decision-making details documented in ED Course. Radiology: ordered.  Risk Prescription drug management.   23 year old female signed out to me at shift change pending CT angiogram to ensure no PE.  Please see HPI for further details.  In short, 23 year old with the episodes of chest pain.  Patient takes propranolol .   Denies any active chest pain.  Signed out to me pending imaging.  CTA negative for PE.  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  Will have patient follow-up with her PCP.  Discharged in stable condition.     Ruthell Lonni FALCON, PA-C 02/11/23 0729    Towana Ozell BROCKS, MD 02/11/23 1745

## 2023-02-11 NOTE — Discharge Instructions (Addendum)
 It was a pleasure taking part in your care.  As we discussed, you workup is reassuring.  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  Please have additional CT scan performed in 3 months to ensure stability of the stomach remnant.  Follow-up with your PCP.  Please return to the ED with any new or worsening symptoms.

## 2023-02-24 ENCOUNTER — Other Ambulatory Visit: Payer: Self-pay | Admitting: Nurse Practitioner

## 2023-02-24 DIAGNOSIS — Q892 Congenital malformations of other endocrine glands: Secondary | ICD-10-CM

## 2023-03-09 ENCOUNTER — Ambulatory Visit: Payer: MEDICAID | Admitting: Cardiology

## 2023-04-02 ENCOUNTER — Other Ambulatory Visit: Payer: Self-pay

## 2023-04-02 ENCOUNTER — Encounter (HOSPITAL_COMMUNITY): Payer: Self-pay

## 2023-04-02 ENCOUNTER — Emergency Department (HOSPITAL_COMMUNITY)
Admission: EM | Admit: 2023-04-02 | Discharge: 2023-04-02 | Disposition: A | Discharge status: Home / Self Care | Payer: MEDICAID | Attending: Emergency Medicine | Admitting: Emergency Medicine

## 2023-04-02 DIAGNOSIS — Z91040 Latex allergy status: Secondary | ICD-10-CM | POA: Insufficient documentation

## 2023-04-02 DIAGNOSIS — R102 Pelvic and perineal pain: Secondary | ICD-10-CM | POA: Diagnosis present

## 2023-04-02 LAB — CBC WITH DIFFERENTIAL/PLATELET
Abs Immature Granulocytes: 0.04 10*3/uL (ref 0.00–0.07)
Basophils Absolute: 0 10*3/uL (ref 0.0–0.1)
Basophils Relative: 1 %
Eosinophils Absolute: 0.1 10*3/uL (ref 0.0–0.5)
Eosinophils Relative: 1 %
HCT: 39.9 % (ref 36.0–46.0)
Hemoglobin: 12.4 g/dL (ref 12.0–15.0)
Immature Granulocytes: 1 %
Lymphocytes Relative: 24 %
Lymphs Abs: 1.9 10*3/uL (ref 0.7–4.0)
MCH: 25.2 pg — ABNORMAL LOW (ref 26.0–34.0)
MCHC: 31.1 g/dL (ref 30.0–36.0)
MCV: 80.9 fL (ref 80.0–100.0)
Monocytes Absolute: 0.4 10*3/uL (ref 0.1–1.0)
Monocytes Relative: 5 %
Neutro Abs: 5.5 10*3/uL (ref 1.7–7.7)
Neutrophils Relative %: 68 %
Platelets: 266 10*3/uL (ref 150–400)
RBC: 4.93 MIL/uL (ref 3.87–5.11)
RDW: 13.3 % (ref 11.5–15.5)
WBC: 7.9 10*3/uL (ref 4.0–10.5)
nRBC: 0 % (ref 0.0–0.2)

## 2023-04-02 LAB — URINALYSIS, ROUTINE W REFLEX MICROSCOPIC
Bilirubin Urine: NEGATIVE
Glucose, UA: NEGATIVE mg/dL
Hgb urine dipstick: NEGATIVE
Ketones, ur: NEGATIVE mg/dL
Nitrite: NEGATIVE
Protein, ur: NEGATIVE mg/dL
Specific Gravity, Urine: 1.011 (ref 1.005–1.030)
pH: 7 (ref 5.0–8.0)

## 2023-04-02 LAB — WET PREP, GENITAL
Clue Cells Wet Prep HPF POC: NONE SEEN
Sperm: NONE SEEN
Trich, Wet Prep: NONE SEEN
WBC, Wet Prep HPF POC: >=10 — AB (ref <10)
Yeast Wet Prep HPF POC: NONE SEEN

## 2023-04-02 LAB — HCG, SERUM, QUALITATIVE: Preg, Serum: NEGATIVE

## 2023-04-02 LAB — COMPREHENSIVE METABOLIC PANEL WITH GFR
ALT: 45 U/L — ABNORMAL HIGH (ref 0–44)
AST: 31 U/L (ref 15–41)
Albumin: 3.5 g/dL (ref 3.5–5.0)
Alkaline Phosphatase: 92 U/L (ref 38–126)
Anion gap: 8 (ref 5–15)
BUN: 8 mg/dL (ref 6–20)
CO2: 22 mmol/L (ref 22–32)
Calcium: 9.2 mg/dL (ref 8.9–10.3)
Chloride: 108 mmol/L (ref 98–111)
Creatinine, Ser: 0.54 mg/dL (ref 0.44–1.00)
GFR, Estimated: >60 mL/min (ref >60)
Glucose, Bld: 88 mg/dL (ref 70–99)
Potassium: 4 mmol/L (ref 3.5–5.1)
Sodium: 138 mmol/L (ref 135–145)
Total Bilirubin: 0.5 mg/dL (ref 0.0–1.2)
Total Protein: 7.1 g/dL (ref 6.5–8.1)

## 2023-04-02 LAB — LIPASE, BLOOD: Lipase: 24 U/L (ref 11–51)

## 2023-04-02 MED ORDER — ACETAMINOPHEN 500 MG PO TABS: 1000.0000 mg | ORAL_TABLET | Freq: Once | ORAL | Status: DC

## 2023-04-02 NOTE — ED Triage Notes (Signed)
 Pt c/o pelvic pain and nausea started last night. LMP 03/01/23. Pt denies vaginal discharge.

## 2023-04-02 NOTE — ED Provider Notes (Signed)
 [facility] EM[facility]ENC[facility] DEPART[facility] AT [facility] Provider Note   CSN: [id number] Arrival date & time: 04/02/23  1310     History  No chief complaint on file.   Emily Golden is a 23 y.o. female with history of bipolar disorder, cholecystectomy, presents with concern for pelvic pain that started yesterday.  States the pain is worse in the left lower pelvis.  She denies any back pain, fever or chills, dysuria, hematuria, or increased frequency.  She does note some episodes of nausea and vomiting over the past couple days. Reports her bowel movements are loose, but this is normal for her as she had her gallbladder taken out.  HPI     Home Medications Prior to Admission medications   Medication Sig Start Date End Date Taking? Authorizing Provider  acetaminophen (TYLENOL) 500 MG tablet Take 2 tablets (1,000 mg total) by mouth every 6 (six) hours as needed for mild pain. 09/02/22   Henrene Dodge, MD  ALBUTEROL IN Inhale 1-2 puffs into the lungs as needed.    [provider]  amoxicillin-clavulanate (AUGMENTIN) 875-125 MG tablet Take 1 tablet by mouth 2 (two) times daily. 12/13/22   Joanne Gavel, MD  beclomethasone (QVAR) 80 MCG/ACT inhaler Inhale 2 puffs into the lungs daily as needed (for shortenss of breath).    [provider]  hydrOXYzine (ATARAX) 25 MG tablet Take 2 tablets (50 mg total) by mouth 3 (three) times daily. 10/11/22   Carlynn Herald, CNM  Prenatal Vit-Fe Fumarate-FA (MULTIVITAMIN-PRENATAL) 27-0.8 MG TABS tablet Take 1 tablet by mouth daily at 12 noon.    [provider]  propranolol (INDERAL) 10 MG tablet Take 1 tablet (10 mg total) by mouth at bedtime. 12/05/22   Tobb, Lavona Mound, DO      Allergies    Other and Latex    Review of Systems   Review of Systems  Genitourinary:  Positive for pelvic pain.    Physical Exam Updated Vital Signs BP 123/70 (BP Location: Left Arm)   Pulse 99   Temp 98.4 F (36.9 C) (Oral)   Resp 16    Ht 5\' 7"  (1.702 m)   Wt 135.8 kg   SpO2 99%   BMI 46.89 kg/m  Physical Exam Vitals and nursing note reviewed. Exam conducted with a chaperone present.  Constitutional:      General: She is not in acute distress.    Appearance: She is well-developed. She is obese.     Comments: Moves around comfortably in the stretcher. No vomiting  HENT:     Head: Normocephalic and atraumatic.  Eyes:     Conjunctiva/sclera: Conjunctivae normal.  Cardiovascular:     Rate and Rhythm: Normal rate and regular rhythm.     Heart sounds: No murmur heard. Pulmonary:     Effort: Pulmonary effort is normal. No respiratory distress.     Breath sounds: Normal breath sounds.  Abdominal:     Palpations: Abdomen is soft.     Tenderness: There is no abdominal tenderness. There is no right CVA tenderness or left CVA tenderness.  Genitourinary:    Comments: RN chaperone Leamon Arnt present for pelvic exam  Vaginal canal and cervix healthy appearing.  No significant vaginal discharge.  No blood in the vaginal vault.  Cervix closed and non friable, no cervical motion tenderness Musculoskeletal:        General: No swelling.     Cervical back: Neck supple.  Skin:    General: Skin is warm  and dry.     Capillary Refill: Capillary refill takes less than 2 seconds.  Neurological:     Mental Status: She is alert.  Psychiatric:        Mood and Affect: Mood normal.     ED Results / Procedures / Treatments   Labs (all labs ordered are listed, but only abnormal results are displayed) Labs Reviewed  WET PREP, GENITAL - Abnormal; Notable for the following components:      Result Value   WBC, Wet Prep HPF POC >=10 (*)    All other components within normal limits  URINALYSIS, ROUTINE W REFLEX MICROSCOPIC - Abnormal; Notable for the following components:   Color, Urine STRAW (*)    Leukocytes,Ua SMALL (*)    Bacteria, UA RARE (*)    All other components within normal limits  CBC WITH DIFFERENTIAL/PLATELET -  Abnormal; Notable for the following components:   MCH 25.2 (*)    All other components within normal limits  COMPREHENSIVE METABOLIC PANEL WITH GFR - Abnormal; Notable for the following components:   ALT 45 (*)    All other components within normal limits  HCG, SERUM, QUALITATIVE  LIPASE, BLOOD  GC/CHLAMYDIA PROBE AMP ([place]) NOT AT [facility]    EKG None  Radiology No results found.  Procedures Procedures    Medications Ordered in ED Medications  acetaminophen (TYLENOL) tablet 1,000 mg (has no administration in time range)    ED Course/ Medical Decision Making/ A&P                                 Medical Decision Making Amount and/or Complexity of Data Reviewed Labs: ordered.  Risk OTC drugs.     Differential diagnosis includes but is not limited to Cholelithiasis, cholangitis, choledocholithiasis, peptic ulcer, gastritis, gastroenteritis, appendicitis, IBS, IBD, DKA, nephrolithiasis, UTI, pyelonephritis, pancreatitis, diverticulitis, mesenteric ischemia, abdominal aortic aneurysm, small bowel obstruction, volvulus, ovarian torsion and pregnancy related concerns in females of childbearing age    ED Course:  Patient is well-appearing, stable vital signs.  Reports pelvic pain, but abdomen is soft and nontender on my exam.  No rebound or guarding.  No active vomiting.  Denying any urinary symptoms.  On pelvic exam, vaginal canal and cervix healthy appearing, no cervical motion tenderness. I Ordered, and personally interpreted labs.  The pertinent results include:   CBC without leukocytosis CMP without any electrolyte abnormalities.  LFTs and creatinine within normal limits Lipase within normal limits GC pending Wet prep without yeast, trichomoniasis, or clue cells seen. Urinalysis with small amount of leukocytes and squamous epithelial cells present.  No nitrates. Pregnancy negative Given abdomen is soft and non-tender, stable vitals, and labs unremarkable, low  concern for any acute intra-abdominal pathology such as appendicitis, abscess, etc. that would require CT scan. Low concern for ovarian torsion given patient very comfortable appearing and pain ongoing for over a day. Although leukocytes present on urinalysis, it does appear to be a unclean catch.  Patient denying any urinary symptoms or back pain, low concern for UTI or pyelonephritis at this time. She denies concern for STIs will no treat prophylactically for any STIs at this time. No cervical motion tenderness or concern for PID.   Stable and appropriate for discharge home at this time  Impression: Pelvic pain  Disposition:  The patient was discharged home with instructions to follow up with OBGYN or PCP within the next week if symptoms not  improving. Tylenol and ibuprofen as needed for pain Return precautions given.   This chart was dictated using voice recognition software, Dragon. Despite the best efforts of this provider to proofread and correct errors, errors may still occur which can change documentation meaning.          Final Clinical Impression(s) / ED Diagnoses Final diagnoses:  Pelvic pain    Rx / DC Orders ED Discharge Orders     None         Arabella Merles, Cordelia Poche 04/02/23 1734    Lonell Grandchild, MD 04/02/23 2037

## 2023-04-02 NOTE — Discharge Instructions (Addendum)
 Your blood counts, electrolytes, kidney, liver, and pancreas labs are normal today.  Your urine did not show any signs of infection.  Your pregnancy test was negative.  You tested negative for BV, trichomoniasis, and yeast.  The cause of your pelvic pain is unclear.  Please follow-up with your PCP or OB/GYN if your pain continues to persist.  You may take up to 1000mg  of tylenol every 6 hours as needed for pain.  Do not take more then 4g per day.  You may use up to 600mg  ibuprofen every 6 hours as needed for pain.  Do not exceed 2.4g of ibuprofen per day.  Return to the ER for any severe worsening of your abdominal pain, vomiting, fevers, any other new or concerning symptoms.

## 2023-04-03 LAB — GC/CHLAMYDIA PROBE AMP (~~LOC~~) NOT AT ARMC
Chlamydia: NEGATIVE
Comment: NEGATIVE
Comment: NORMAL
Neisseria Gonorrhea: NEGATIVE

## 2023-06-29 ENCOUNTER — Encounter (HOSPITAL_COMMUNITY): Payer: Self-pay | Admitting: Obstetrics and Gynecology

## 2023-06-29 ENCOUNTER — Other Ambulatory Visit: Payer: Self-pay

## 2023-06-29 ENCOUNTER — Inpatient Hospital Stay (HOSPITAL_COMMUNITY)
Admission: AD | Admit: 2023-06-29 | Discharge: 2023-06-29 | Disposition: A | Discharge status: Home / Self Care | Payer: MEDICAID | Attending: Obstetrics and Gynecology | Admitting: Obstetrics and Gynecology

## 2023-06-29 DIAGNOSIS — F17200 Nicotine dependence, unspecified, uncomplicated: Secondary | ICD-10-CM

## 2023-06-29 DIAGNOSIS — O10911 Unspecified pre-existing hypertension complicating pregnancy, first trimester: Secondary | ICD-10-CM | POA: Diagnosis not present

## 2023-06-29 DIAGNOSIS — R519 Headache, unspecified: Secondary | ICD-10-CM

## 2023-06-29 DIAGNOSIS — Z3A01 Less than 8 weeks gestation of pregnancy: Secondary | ICD-10-CM | POA: Diagnosis not present

## 2023-06-29 DIAGNOSIS — O26891 Other specified pregnancy related conditions, first trimester: Secondary | ICD-10-CM | POA: Diagnosis not present

## 2023-06-29 DIAGNOSIS — O10011 Pre-existing essential hypertension complicating pregnancy, first trimester: Secondary | ICD-10-CM | POA: Insufficient documentation

## 2023-06-29 DIAGNOSIS — O10919 Unspecified pre-existing hypertension complicating pregnancy, unspecified trimester: Secondary | ICD-10-CM

## 2023-06-29 DIAGNOSIS — R42 Dizziness and giddiness: Secondary | ICD-10-CM | POA: Diagnosis present

## 2023-06-29 DIAGNOSIS — R030 Elevated blood-pressure reading, without diagnosis of hypertension: Secondary | ICD-10-CM | POA: Diagnosis present

## 2023-06-29 MED ORDER — ACETAMINOPHEN-CAFFEINE 500-65 MG PO TABS
2.0000 | ORAL_TABLET | Freq: Once | ORAL | Status: AC
  Administered 2023-06-29: 2 via ORAL
  Filled 2023-06-29: qty 2

## 2023-06-29 NOTE — MAU Provider Note (Signed)
 Chief Complaint: Headache and Hypertension   Event Date/Time   First Provider Initiated Contact with Patient 06/29/23 0207        SUBJECTIVE HPI: Emily Golden is a 23 y.o. G6P1030 at [redacted]w[redacted]d by LMP who presents to maternity admissions reporting headache with elevated blood pressure readings at home.  Has a history of preeclampsia with pregnancy.  . She denies vaginal bleeding, vaginal itching/burning, urinary symptoms, h/a, dizziness, n/v, or fever/chills.   Pregnancy is being followed by [facility] in .  Last HCG was 603.  Has not had US  yet.   Headache  This is a new problem. The current episode started today. The problem occurs constantly. The quality of the pain is described as aching. Pertinent negatives include no blurred vision, coughing or dizziness. She has tried nothing for the symptoms. Her past medical history is significant for hypertension.  Hypertension This is a chronic problem. Associated symptoms include headaches. Pertinent negatives include no blurred vision or chest pain. There are no associated agents to hypertension. Past treatments include nothing. There are no compliance problems.    RN Note: Emily Golden is a 23 y.o. at Unknown here in MAU reporting: checked blood pressure 4x - all elevated. Having pressure HA, feeling dizzy, and feet are swollen. Has not taken medication for HA.  Onset of complaint: 0040 Pain score: 4  Past Medical History:  Diagnosis Date   Allergy    Anxiety    Asthma    Bipolar 1 disorder (HCC)    Gallstones    SVT (supraventricular tachycardia) (HCC)    Past Surgical History:  Procedure Laterality Date   compound fracture right arm Right    NASAL RECONSTRUCTION     Social History   Socioeconomic History   Marital status: Married    Spouse name: Not on file   Number of children: Not on file   Years of education: Not on file   Highest education level: Not on file  Occupational History   Not on file  Tobacco Use   Smoking  status: Former    Current packs/day: 0.00    Types: Cigarettes    Quit date: 04/09/2018    Years since quitting: 5.2    Passive exposure: Past   Smokeless tobacco: Never   Tobacco comments:    Stopped smoking 3 months before she got pregnant  Vaping Use   Vaping status: Former  Substance and Sexual Activity   Alcohol use: Not Currently   Drug use: Not Currently   Sexual activity: Yes  Other Topics Concern   Not on file  Social History Narrative   Is in 10th grade at [employer]   Social Drivers of Health   Financial Resource Strain: Not on file  Food Insecurity: No Food Insecurity (09/22/2021)   Hunger Vital Sign    Worried About Running Out of Food in the Last Year: Never true    Ran Out of Food in the Last Year: Never true  Transportation Needs: No Transportation Needs (09/22/2021)   PRAPARE - Administrator, Civil Service (Medical): No    Lack of Transportation (Non-Medical): No  Physical Activity: Not on file  Stress: Not on file  Social Connections: Not on file  Intimate Partner Violence: Not on file   No current facility-administered medications on file prior to encounter.   Current Outpatient Medications on File Prior to Encounter  Medication Sig Dispense Refill   acetaminophen  (TYLENOL ) 500 MG tablet Take 2 tablets (1,000  mg total) by mouth every 6 (six) hours as needed for mild pain.     ALBUTEROL  IN Inhale 1-2 puffs into the lungs as needed.     amoxicillin -clavulanate (AUGMENTIN ) 875-125 MG tablet Take 1 tablet by mouth 2 (two) times daily. 10 tablet 0   beclomethasone (QVAR ) 80 MCG/ACT inhaler Inhale 2 puffs into the lungs daily as needed (for shortenss of breath).     hydrOXYzine  (ATARAX ) 25 MG tablet Take 2 tablets (50 mg total) by mouth 3 (three) times daily. 60 tablet 2   Prenatal Vit-Fe Fumarate-FA (MULTIVITAMIN-PRENATAL) 27-0.8 MG TABS tablet Take 1 tablet by mouth daily at 12 noon.     propranolol  (INDERAL ) 10 MG tablet Take 1  tablet (10 mg total) by mouth at bedtime. 90 tablet 3   Allergies  Allergen Reactions   Other Shortness Of Breath    Dust, Reports smells,perfumes,aerosols HX asthma    Latex Rash    I have reviewed patient's Past Medical Hx, Surgical Hx, Family Hx, Social Hx, medications and allergies.   ROS:  Review of Systems  Eyes:  Negative for blurred vision.  Respiratory:  Negative for cough.   Cardiovascular:  Negative for chest pain.  Neurological:  Positive for headaches. Negative for dizziness.   Review of Systems  Other systems negative   Physical Exam  Physical Exam Patient Vitals for the past 24 hrs:  BP Temp Temp src Pulse Resp SpO2 Height Weight  06/29/23 0153 (!) 140/79 98.7 F (37.1 C) Oral 82 16 100 % 5' 7.5 (1.715 m) (!) 145.8 kg   Vitals:   06/29/23 0153 06/29/23 0258  BP: (!) 140/79 117/72    Constitutional: Well-developed, well-nourished female in no acute distress.  Cardiovascular: normal rate Respiratory: normal effort GI: Abd soft, non-tender.  MS: Extremities nontender, Trace edema, normal ROM Neurologic: Alert and oriented x 4.   LAB RESULTS No results found for this or any previous visit (from the past 24 hours).   IMAGING No results found.  MAU Management/MDM: I have reviewed the triage vital signs and the nursing notes.   Pertinent labs & imaging results that were available during my care of the patient were reviewed by me and considered in my medical decision making (see chart for details).      I have reviewed her medical records including past results, notes and treatments. Medical, Surgical, and family history were reviewed.  Medications and recent lab tests were reviewed   Treatments in MAU included Excedrin Tension for headache.    ASSESSMENT Pregnancy at [redacted]w[redacted]d by LMP Hypertension, likely chronic Headache  PLAN Discharge home Recommend close followup with OB provider  Pt stable at time of discharge. Encouraged to return here if  she develops worsening of symptoms, increase in pain, fever, or other concerning symptoms.    Earnie Pouch CNM, MSN Certified Nurse-Midwife 06/29/2023  2:07 AM

## 2023-06-29 NOTE — MAU Note (Signed)
 Emily Golden is a 23 y.o. at Unknown here in MAU reporting: checked blood pressure 4x - all elevated. Having pressure HA, feeling dizzy, and feet are swollen. Has not taken medication for HA.   LMP: 5/12 Onset of complaint: 0040 Pain score: 4 Vitals:   06/29/23 0153  BP: (!) 140/79  Pulse: 82  Resp: 16  Temp: 98.7 F (37.1 C)  SpO2: 100%     FHT: NA  Lab orders placed from triage: none

## 2023-07-19 LAB — OB RESULTS CONSOLE HGB/HCT, BLOOD
HCT: 37 (ref 29–41)
Hemoglobin: 12.6

## 2023-07-19 LAB — HEPATITIS C ANTIBODY: HCV Ab: NEGATIVE

## 2023-07-19 LAB — OB RESULTS CONSOLE GC/CHLAMYDIA
Chlamydia: NEGATIVE
Neisseria Gonorrhea: NEGATIVE

## 2023-07-19 LAB — OB RESULTS CONSOLE ANTIBODY SCREEN: Antibody Screen: NEGATIVE

## 2023-07-19 LAB — OB RESULTS CONSOLE ABO/RH: RH Type: POSITIVE

## 2023-07-19 LAB — OB RESULTS CONSOLE PLATELET COUNT: Platelets: 246

## 2023-07-19 LAB — OB RESULTS CONSOLE HIV ANTIBODY (ROUTINE TESTING): HIV: NONREACTIVE

## 2023-07-19 LAB — OB RESULTS CONSOLE HEPATITIS B SURFACE ANTIGEN: Hepatitis B Surface Ag: NEGATIVE

## 2023-07-19 NOTE — Progress Notes (Signed)
 Patient is a [id number] at  9 weeks 2days Patient has some nausea for the past few days, oral antiemetic previously sent. Precautions given. Patient denies vaginal bleeding.  cfDNA ([facility]) screening discussed, including possibility of confirmatory testing in the event of a positive screen. Patient desires, will collect at net appointment.  MFM referral for fetal echo later in pregnancy, patient's son has had tetralogy of fallot. Patient states understanding.  Prenatal labs, cultures and Pap collected.  Precautions given, RTC 4 weeks.

## 2023-07-20 LAB — OB RESULTS CONSOLE VARICELLA ZOSTER ANTIBODY, IGG: Varicella: NON-IMMUNE/NOT IMMUNE

## 2023-07-20 LAB — OB RESULTS CONSOLE RPR: RPR: NONREACTIVE

## 2023-07-20 LAB — OB RESULTS CONSOLE RUBELLA ANTIBODY, IGM: Rubella: IMMUNE

## 2023-07-25 LAB — CYTOLOGY - PAP: Pap: NEGATIVE

## 2023-08-16 DIAGNOSIS — Z8279 Family history of other congenital malformations, deformations and chromosomal abnormalities: Secondary | ICD-10-CM | POA: Insufficient documentation

## 2023-08-16 NOTE — Progress Notes (Signed)
 Elisheva A Gruszka [id number] at [redacted]w[redacted]d for ROB. Estimated Date of Delivery: 02/19/24 BP 124/84   Wt (!) 139 kg (307 lb 6.4 oz)   LMP 05/15/2023   BMI 47.44 kg/m   She is doing well, denies vaginal bleeding, LOF, abdominal pain.   Genetics- NIPT today, for AFP at next visit  -Previous child with Tetrology of Fallot (G4) Delivered at [facility], surgery ~81mo, doing well, meeting milestones. MFM referral placed For fetal echo this pregnancy  -Obesity- [place] BMI 47.35 For serial growths at 28w Weekly BPPs at 34w  -Short Interval pregnancy- This was a un unplanned but desired pregnancy. Discussed optimal birth spacing.  Return in about 4 weeks (around 09/13/2023) for ROB.

## 2023-08-22 LAB — PANORAMA PRENATAL TEST FULL PANEL:PANORAMA TEST PLUS 5 ADDITIONAL MICRODELETIONS: FETAL FRACTION: 5

## 2023-08-31 ENCOUNTER — Other Ambulatory Visit: Payer: Self-pay

## 2023-08-31 ENCOUNTER — Encounter (HOSPITAL_COMMUNITY): Payer: Self-pay | Admitting: Obstetrics & Gynecology

## 2023-08-31 ENCOUNTER — Inpatient Hospital Stay (HOSPITAL_COMMUNITY)
Admission: AD | Admit: 2023-08-31 | Discharge: 2023-08-31 | Disposition: A | Discharge status: Home / Self Care | Payer: MEDICAID | Attending: Obstetrics & Gynecology | Admitting: Obstetrics & Gynecology

## 2023-08-31 ENCOUNTER — Inpatient Hospital Stay (HOSPITAL_COMMUNITY): Payer: MEDICAID

## 2023-08-31 DIAGNOSIS — Z3A15 15 weeks gestation of pregnancy: Secondary | ICD-10-CM

## 2023-08-31 DIAGNOSIS — O2342 Unspecified infection of urinary tract in pregnancy, second trimester: Secondary | ICD-10-CM

## 2023-08-31 LAB — CBC WITH DIFFERENTIAL/PLATELET
Abs Immature Granulocytes: 0.03 K/uL (ref 0.00–0.07)
Basophils Absolute: 0 K/uL (ref 0.0–0.1)
Basophils Relative: 0 %
Eosinophils Absolute: 0.1 K/uL (ref 0.0–0.5)
Eosinophils Relative: 1 %
HCT: 37 % (ref 36.0–46.0)
Hemoglobin: 12.1 g/dL (ref 12.0–15.0)
Immature Granulocytes: 1 %
Lymphocytes Relative: 25 %
Lymphs Abs: 1.5 K/uL (ref 0.7–4.0)
MCH: 26.4 pg (ref 26.0–34.0)
MCHC: 32.7 g/dL (ref 30.0–36.0)
MCV: 80.8 fL (ref 80.0–100.0)
Monocytes Absolute: 0.5 K/uL (ref 0.1–1.0)
Monocytes Relative: 8 %
Neutro Abs: 3.9 K/uL (ref 1.7–7.7)
Neutrophils Relative %: 65 %
Platelets: 237 K/uL (ref 150–400)
RBC: 4.58 MIL/uL (ref 3.87–5.11)
RDW: 14.3 % (ref 11.5–15.5)
WBC: 6 K/uL (ref 4.0–10.5)
nRBC: 0 % (ref 0.0–0.2)

## 2023-08-31 LAB — URINALYSIS, ROUTINE W REFLEX MICROSCOPIC
Bilirubin Urine: NEGATIVE
Glucose, UA: NEGATIVE mg/dL
Hgb urine dipstick: NEGATIVE
Ketones, ur: NEGATIVE mg/dL
Nitrite: NEGATIVE
Protein, ur: 30 mg/dL — AB
Specific Gravity, Urine: 1.028 (ref 1.005–1.030)
pH: 5 (ref 5.0–8.0)

## 2023-08-31 LAB — COMPREHENSIVE METABOLIC PANEL WITH GFR
ALT: 25 U/L (ref 0–44)
AST: 22 U/L (ref 15–41)
Albumin: 2.8 g/dL — ABNORMAL LOW (ref 3.5–5.0)
Alkaline Phosphatase: 86 U/L (ref 38–126)
Anion gap: 9 (ref 5–15)
BUN: <5 mg/dL — ABNORMAL LOW (ref 6–20)
CO2: 18 mmol/L — ABNORMAL LOW (ref 22–32)
Calcium: 9.3 mg/dL (ref 8.9–10.3)
Chloride: 109 mmol/L (ref 98–111)
Creatinine, Ser: 0.53 mg/dL (ref 0.44–1.00)
GFR, Estimated: >60 mL/min (ref >60)
Glucose, Bld: 82 mg/dL (ref 70–99)
Potassium: 3.6 mmol/L (ref 3.5–5.1)
Sodium: 136 mmol/L (ref 135–145)
Total Bilirubin: 0.5 mg/dL (ref 0.0–1.2)
Total Protein: 6.4 g/dL — ABNORMAL LOW (ref 6.5–8.1)

## 2023-08-31 MED ORDER — ONDANSETRON HCL 4 MG/2ML IJ SOLN
4.0000 mg | Freq: Once | INTRAMUSCULAR | Status: AC
  Administered 2023-08-31: 4 mg via INTRAVENOUS
  Filled 2023-08-31: qty 2

## 2023-08-31 MED ORDER — OXYCODONE HCL 5 MG PO TABS: 5.0000 mg | ORAL_TABLET | Freq: Four times a day (QID) | ORAL | 0 refills | Status: DC | PRN

## 2023-08-31 MED ORDER — CEFADROXIL 500 MG PO CAPS: 500.0000 mg | ORAL_CAPSULE | Freq: Two times a day (BID) | ORAL | 0 refills | Status: DC

## 2023-08-31 MED ORDER — SODIUM CHLORIDE 0.9 % IV SOLN
1.0000 g | Freq: Once | INTRAVENOUS | Status: AC
  Administered 2023-08-31: 1 g via INTRAVENOUS
  Filled 2023-08-31: qty 10

## 2023-08-31 MED ORDER — LACTATED RINGERS IV BOLUS
1000.0000 mL | Freq: Once | INTRAVENOUS | Status: AC
  Administered 2023-08-31: 1000 mL via INTRAVENOUS

## 2023-08-31 MED ORDER — ONDANSETRON 4 MG PO TBDP: 4.0000 mg | ORAL_TABLET | Freq: Four times a day (QID) | ORAL | 0 refills | Status: DC | PRN

## 2023-08-31 MED ORDER — OXYCODONE HCL 5 MG PO TABS
5.0000 mg | ORAL_TABLET | Freq: Once | ORAL | Status: AC
  Administered 2023-08-31: 5 mg via ORAL
  Filled 2023-08-31: qty 1

## 2023-08-31 NOTE — MAU Note (Cosign Needed)
 Maternal Assessment Unit Provider Note  Subjective: [name] is a 23 y.o. [phone] pregnant female at [redacted]w[redacted]d who presents to MAU today with complaint of UTI and worsening back pain.   Symptoms started over a week ago with some dysuria, left back/flank pain, suprapubic pressure.  She waited a week, trying home remedies before reaching out to her OB.  Her OB started her on cephalexin which she started taking 2 days ago.  She reports persistently worse back pain with nausea which is why she presented to the ER today.  Pain is 6-7/10 in the left low back wrapping around flank. Achy quality. Not improved with acetaminophen  1000mg  which she took at 3 am.   No fevers.  No personal history or family history of nephrolithiasis. No blood in urine, no vaginal bleeding, no abnormal vaginal discharge.  Receives care at [facility]. Prenatal records reviewed.  Pertinent items noted in HPI and remainder of comprehensive ROS otherwise negative.   Objective: BP 129/70 (BP Location: Right Arm)   Pulse 89   Temp 98.5 F (36.9 C) (Oral)   Resp 19   Ht 5' 7.5 (1.715 m)   Wt (!) 138.8 kg   LMP 05/15/2023   SpO2 98%   BMI 47.20 kg/m  FHT: 142 bpm  Physical Exam Vitals reviewed.  Constitutional:      General: She is not in acute distress.    Appearance: She is well-developed. She is not diaphoretic.  Eyes:     General: No scleral icterus. Cardiovascular:     Rate and Rhythm: Normal rate and regular rhythm.     Heart sounds: No murmur heard.    No gallop.  Pulmonary:     Effort: Pulmonary effort is normal. No respiratory distress.     Breath sounds: Normal breath sounds.  Abdominal:     General: There is no distension.     Palpations: Abdomen is soft.     Tenderness: There is no abdominal tenderness. There is no right CVA tenderness, left CVA tenderness, guarding or rebound.  Skin:    General: Skin is warm and dry.  Neurological:     General: No focal deficit present.     Mental  Status: She is alert.  Psychiatric:        Behavior: Behavior normal.     MDM: Moderate risk  MAU Course:  Time: 0600 VSS and normal upon presentation Patient evaluated, 1 L LR bolus, CBC/CMP, UA, Zofran  4mg  had all been ordered in triage.   Ordered oxycodone  5mg  for pain and renal US  ordered.   Time: 0700 Patient was re-evaluated, pain is improved. US  results pending. Discussed case with Dr. Eveline. VS normal, no WBC, no CVA TTP. Tolerating PO intake. No cx available from outside facility ([facility]), will send cx even though likely not helpful given on 2 days of abx. No indication for inpatient treatment or switching abx at this time, Rocephin  1g IV ordered. Supportive medication--zofran , oxycodone  ordered for outpatient.   Time: 0830 US  renal normal. Patient's abx switched per pt preference for BID dosing to cephadroxil. Discharge after receives Rocephin .  Patient care transferred to Dr. Steffan Rover.  Assessment Medical screening exam complete    ICD-10-CM   1. Urinary tract infection in mother during second trimester of pregnancy  O23.42        Plan Discharge from MAU in stable condition with strict return precautions Follow up at [facility] as scheduled for ongoing prenatal care, sooner if no improvement of symptoms  Allergies as of 08/31/2023       Reactions   Other Shortness Of Breath   Dust, Reports smells,perfumes,aerosols HX asthma   Latex Rash        Medication List     STOP taking these medications    amoxicillin -clavulanate 875-125 MG tablet Commonly known as: AUGMENTIN        TAKE these medications    acetaminophen  500 MG tablet Commonly known as: TYLENOL  Take 2 tablets (1,000 mg total) by mouth every 6 (six) hours as needed for mild pain.   ALBUTEROL  IN Inhale 1-2 puffs into the lungs as needed.   beclomethasone 80 MCG/ACT inhaler Commonly known as: QVAR  Inhale 2 puffs into the lungs daily as needed (for shortenss of breath).    hydrOXYzine  25 MG tablet Commonly known as: ATARAX  Take 2 tablets (50 mg total) by mouth 3 (three) times daily.   multivitamin-prenatal 27-0.8 MG Tabs tablet Take 1 tablet by mouth daily at 12 noon.   ondansetron  4 MG disintegrating tablet Commonly known as: ZOFRAN -ODT Take 1 tablet (4 mg total) by mouth every 6 (six) hours as needed for nausea or vomiting.   oxyCODONE  5 MG immediate release tablet Commonly known as: Oxy IR/ROXICODONE  Take 1 tablet (5 mg total) by mouth every 6 (six) hours as needed for severe pain (pain score 7-10) or breakthrough pain.   propranolol  10 MG tablet Commonly known as: INDERAL  Take 1 tablet (10 mg total) by mouth at bedtime.        Trudy Leeroy NOVAK, MD 08/31/2023 8:25 AM

## 2023-08-31 NOTE — MAU Note (Signed)
 Emily Golden is a 23 y.o. at [redacted]w[redacted]d here in MAU reporting: thought she had a UTI for a week - tried at home remedies but did not have relief. Was seen at her OB office and was given antibiotics - started first dose on Tuesday - now having increase pain in her back that radiates towards her left side and nausea. Reports pressure during urination as well. Denies VB or LOF. Took extra strength tylenol  around 0300.   LMP: NA Onset of complaint: on going  Pain score: 7 Vitals:   08/31/23 0444  BP: 129/70  Pulse: 89  Resp: 19  Temp: 98.5 F (36.9 C)  SpO2: 98%     FHT: 142  Lab orders placed from triage: UA

## 2023-08-31 NOTE — Discharge Instructions (Signed)
 It was a privilege to be part of your care team today. Congratulations on your pregnancy! You were diagnosed with a UTI in pregnancy. I prescribed Zofran  for nausea and oxycodone  for severe pain that is not controlled with acetaminophen . Continue taking your cephalexin antibiotics until the course is completed. You were given a dose of IV ceftriaxone  in the ER as well to treat your UTI.  If you need to change antibiotics based on the urine culture we sent out today, we will notify you of this via MyChart. The urine culture result usually takes 1-2 days.  Drink lots of water in addition to the medications.  If you develop fevers, worst pain not controlled with pain medication, blood in your urine, intractable vomiting please return to the ER. If you are not improving over the next couple days, please follow up with you OB-Gyn. Otherwise, you can follow up at your next regularly scheduled prenatal visit.  Dr. Trudy

## 2023-09-01 ENCOUNTER — Ambulatory Visit: Payer: Self-pay

## 2023-09-01 LAB — CULTURE, OB URINE

## 2023-09-26 ENCOUNTER — Ambulatory Visit (HOSPITAL_COMMUNITY)
Admission: EM | Admit: 2023-09-26 | Discharge: 2023-09-26 | Disposition: A | Discharge status: Home / Self Care | Payer: MEDICAID | Attending: Psychiatry | Admitting: Psychiatry

## 2023-09-26 DIAGNOSIS — F410 Panic disorder [episodic paroxysmal anxiety] without agoraphobia: Secondary | ICD-10-CM | POA: Insufficient documentation

## 2023-09-26 DIAGNOSIS — Z560 Unemployment, unspecified: Secondary | ICD-10-CM | POA: Insufficient documentation

## 2023-09-26 DIAGNOSIS — O99342 Other mental disorders complicating pregnancy, second trimester: Secondary | ICD-10-CM | POA: Insufficient documentation

## 2023-09-26 DIAGNOSIS — F4310 Post-traumatic stress disorder, unspecified: Secondary | ICD-10-CM | POA: Insufficient documentation

## 2023-09-26 DIAGNOSIS — Z3A18 18 weeks gestation of pregnancy: Secondary | ICD-10-CM | POA: Insufficient documentation

## 2023-09-26 NOTE — Discharge Instructions (Addendum)
 Below are some resources and a list of providers you can reach out to for medication management. You also have the option to come upstairs tomorrow at 6:50 AM as a walk-in to be seen. This can be a helpful way to get care started quickly if you're available.  [email or web address] / [facility] [address] [place], [place] [address] [phone]        Safety Plan  PT's NAME will reach out to her husband, mother, call 911 or call mobile crisis, or go to nearest emergency room if condition worsens or if suicidal thoughts become active Patients' will follow up with outpatient psychiatric services (therapy/medication management).  The suicide prevention education provided includes the following: Suicide risk factors Suicide prevention and interventions National Suicide Hotline telephone number [facility] assessment telephone number [facility] Emergency Assistance [address] and/or Residential Mobile Crisis Unit telephone number Remove weapons (e.g., guns, rifles, knives), all items previously/currently identified as safety concern.          9.    Remove drugs/medications (over the counter, prescriptions, illicit drugs), all items previously/currently identified as a safety concern.    Discharge Instructions:  Get help right away if: You have thoughts about hurting yourself or others. Get help right away if you feel like you may hurt yourself or others, or have thoughts about taking your own life. Go to your nearest emergency room or: Call 911. Call the National Suicide Prevention Lifeline at 312-499-9857 or 988 in the [place] This is open 24 hours a day. If you're a Veteran: Call 988 and press 1. This is open 24 hours a day. Text the [employer] at 531-708-2601. Summary Mental health is not just the absence of mental illness. It involves understanding your emotions and behaviors, and taking steps to manage them in a  healthy way. If you have symptoms of mental or emotional distress, get help from family, friends, a health care provider, or a mental health professional. Practice good mental health behaviors such as stress management skills, self-calming skills, exercise, healthy sleeping and eating, and supportive relationships. This information is not intended to replace advice given to you by your health care provider. Make sure you discuss any questions you have with your health care provider including your heart rate that was elevated this visit.
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
## 2023-09-26 NOTE — ED Notes (Signed)
 Patient discharged home. AVS given and reviewed with patient, no questions at this time. Facility contact number reviewed in case of future questions. Patient belongings returned complete and intact. Writer walked patient out to lobby. Safety maintained.
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
## 2023-09-27 ENCOUNTER — Encounter (HOSPITAL_COMMUNITY): Payer: Self-pay | Admitting: Physician Assistant

## 2023-09-27 ENCOUNTER — Ambulatory Visit (HOSPITAL_COMMUNITY): Payer: MEDICAID | Admitting: Physician Assistant

## 2023-09-27 VITALS — BP 123/68 | HR 92 | Temp 98.1°F | Ht 67.5 in | Wt 309.0 lb

## 2023-09-27 DIAGNOSIS — F3181 Bipolar II disorder: Secondary | ICD-10-CM | POA: Diagnosis not present

## 2023-09-27 DIAGNOSIS — F4310 Post-traumatic stress disorder, unspecified: Secondary | ICD-10-CM | POA: Diagnosis not present

## 2023-09-27 DIAGNOSIS — F411 Generalized anxiety disorder: Secondary | ICD-10-CM | POA: Diagnosis not present

## 2023-09-27 MED ORDER — ARIPIPRAZOLE 5 MG PO TABS: 5.0000 mg | ORAL_TABLET | Freq: Every day | ORAL | 1 refills | Status: DC

## 2023-09-27 MED ORDER — BUSPIRONE HCL 7.5 MG PO TABS: 7.5000 mg | ORAL_TABLET | Freq: Two times a day (BID) | ORAL | 1 refills | Status: DC
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
## 2023-10-09 ENCOUNTER — Other Ambulatory Visit: Payer: Self-pay

## 2023-10-09 ENCOUNTER — Inpatient Hospital Stay (HOSPITAL_COMMUNITY)
Admission: AD | Admit: 2023-10-09 | Discharge: 2023-10-09 | Disposition: A | Discharge status: Home / Self Care | Payer: MEDICAID | Attending: Obstetrics and Gynecology | Admitting: Obstetrics and Gynecology

## 2023-10-09 ENCOUNTER — Encounter (HOSPITAL_COMMUNITY): Payer: Self-pay | Admitting: Obstetrics & Gynecology

## 2023-10-09 DIAGNOSIS — R42 Dizziness and giddiness: Secondary | ICD-10-CM | POA: Diagnosis present

## 2023-10-09 DIAGNOSIS — O09292 Supervision of pregnancy with other poor reproductive or obstetric history, second trimester: Secondary | ICD-10-CM | POA: Insufficient documentation

## 2023-10-09 DIAGNOSIS — O26892 Other specified pregnancy related conditions, second trimester: Secondary | ICD-10-CM

## 2023-10-09 DIAGNOSIS — O99282 Endocrine, nutritional and metabolic diseases complicating pregnancy, second trimester: Secondary | ICD-10-CM | POA: Diagnosis not present

## 2023-10-09 DIAGNOSIS — E876 Hypokalemia: Secondary | ICD-10-CM | POA: Diagnosis not present

## 2023-10-09 DIAGNOSIS — Z3A21 21 weeks gestation of pregnancy: Secondary | ICD-10-CM

## 2023-10-09 DIAGNOSIS — R030 Elevated blood-pressure reading, without diagnosis of hypertension: Secondary | ICD-10-CM | POA: Diagnosis not present

## 2023-10-09 DIAGNOSIS — R519 Headache, unspecified: Secondary | ICD-10-CM | POA: Diagnosis present

## 2023-10-09 HISTORY — DX: Bipolar II disorder: F31.81

## 2023-10-09 HISTORY — DX: Post-traumatic stress disorder, unspecified: F43.10

## 2023-10-09 LAB — CBC
HCT: 34.7 % — ABNORMAL LOW (ref 36.0–46.0)
Hemoglobin: 11.3 g/dL — ABNORMAL LOW (ref 12.0–15.0)
MCH: 27.4 pg (ref 26.0–34.0)
MCHC: 32.6 g/dL (ref 30.0–36.0)
MCV: 84 fL (ref 80.0–100.0)
Platelets: 241 K/uL (ref 150–400)
RBC: 4.13 MIL/uL (ref 3.87–5.11)
RDW: 15 % (ref 11.5–15.5)
WBC: 8.5 K/uL (ref 4.0–10.5)
nRBC: 0 % (ref 0.0–0.2)

## 2023-10-09 LAB — URINALYSIS, ROUTINE W REFLEX MICROSCOPIC
Bilirubin Urine: NEGATIVE
Glucose, UA: NEGATIVE mg/dL
Hgb urine dipstick: NEGATIVE
Ketones, ur: 5 mg/dL — AB
Nitrite: NEGATIVE
Protein, ur: NEGATIVE mg/dL
Specific Gravity, Urine: 1.027 (ref 1.005–1.030)
pH: 5 (ref 5.0–8.0)

## 2023-10-09 LAB — COMPREHENSIVE METABOLIC PANEL WITH GFR
ALT: 16 U/L (ref 0–44)
AST: 16 U/L (ref 15–41)
Albumin: 2.7 g/dL — ABNORMAL LOW (ref 3.5–5.0)
Alkaline Phosphatase: 73 U/L (ref 38–126)
Anion gap: 10 (ref 5–15)
BUN: <5 mg/dL — ABNORMAL LOW (ref 6–20)
CO2: 19 mmol/L — ABNORMAL LOW (ref 22–32)
Calcium: 8.6 mg/dL — ABNORMAL LOW (ref 8.9–10.3)
Chloride: 109 mmol/L (ref 98–111)
Creatinine, Ser: 0.52 mg/dL (ref 0.44–1.00)
GFR, Estimated: >60 mL/min (ref >60)
Glucose, Bld: 80 mg/dL (ref 70–99)
Potassium: 3.3 mmol/L — ABNORMAL LOW (ref 3.5–5.1)
Sodium: 138 mmol/L (ref 135–145)
Total Bilirubin: 0.5 mg/dL (ref 0.0–1.2)
Total Protein: 6 g/dL — ABNORMAL LOW (ref 6.5–8.1)

## 2023-10-09 LAB — PROTEIN / CREATININE RATIO, URINE
Creatinine, Urine: 203 mg/dL
Protein Creatinine Ratio: 0.07 mg/mg{creat} (ref 0.00–0.15)
Total Protein, Urine: 14 mg/dL

## 2023-10-09 LAB — GLUCOSE, CAPILLARY: Glucose-Capillary: 80 mg/dL (ref 70–99)

## 2023-10-09 MED ORDER — POTASSIUM CHLORIDE CRYS ER 20 MEQ PO TBCR: 20.0000 meq | EXTENDED_RELEASE_TABLET | Freq: Two times a day (BID) | ORAL | 0 refills | Status: DC

## 2023-10-09 MED ORDER — ACETAMINOPHEN-CAFFEINE 500-65 MG PO TABS
2.0000 | ORAL_TABLET | Freq: Once | ORAL | Status: AC
  Administered 2023-10-09: 2 via ORAL
  Filled 2023-10-09: qty 2

## 2023-10-09 NOTE — MAU Note (Signed)
..  Emily Golden is a 23 y.o. at [redacted]w[redacted]d here in MAU reporting: She stopped by the local fire department because she was feeling unwell for a BP check. She has not felt good since 0400 this morning. She had floaters in her vision, swelling in her BLE, and a headache. When she had it checked at the FD her BP was 158/88. She reports that on her way in she felt very dizzy and her peripheral vision was going black. Patient denies chest pain, but does feel like her heart is racing. Denies VB or LOF.   Pain score: 4 Vitals:   10/09/23 1656  BP: 137/82  Pulse: (!) 121  Resp: 16  Temp: 98.5 F (36.9 C)  SpO2: 100%     FHT:137 Lab orders placed from triage:  ua, EKG, CBG
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
## 2023-10-09 NOTE — MAU Provider Note (Signed)
 Chief Complaint:  Hypertension, Headache, and Dizziness   HPI    Emily Golden is a 23 y.o. [id number] at [redacted]w[redacted]d who presents to maternity admissions reporting that she is here for her blood pressure.  Patient states she has not felt well since 4 AM this morning and she had gone to her local fire department for blood pressure check which was elevated at 158/88.  She denies any history of hypertension thus far this pregnancy although she had preeclampsia with her prior pregnancy  Patient states she also has a headache that she is rating 3 out of 10 on a pain scale and has not taken any medication for resolve.  She also reports that she has had some visual changes but denies any shortness of breath, chest pain and offers no OB complaints at this time.  Patient denies vaginal bleeding, leaking of fluid, and feels fetal movements.   Pregnancy Course: [facility]   Past Medical History:  Diagnosis Date   Allergy    Anxiety    Asthma    Bipolar 2 disorder (HCC)    Gallstones    PTSD (post-traumatic stress disorder)    SVT (supraventricular tachycardia)    OB History  Gravida Para Term Preterm AB Living  6 1 1  0 4 1  SAB IAB Ectopic Multiple Live Births  4 0 0  1    # Outcome Date GA Lbr Len/2nd Weight Sex Type Anes PTL Lv  6 Current           5 Term 12/20/22 [redacted]w[redacted]d 01:11 / 00:25 3340 g M Vag-Spont EPI  LIV  4 SAB 10/04/21 [redacted]w[redacted]d   U         Birth Comments: SAB managed with cytotec   3 SAB 2022          2 SAB         ND  1 SAB            Past Surgical History:  Procedure Laterality Date   CHOLECYSTECTOMY     compound fracture right arm Right    NASAL RECONSTRUCTION     Family History  Problem Relation Age of Onset   Diabetes Maternal Grandmother    Hypertension Maternal Grandmother    Hyperlipidemia Maternal Grandmother    Heart disease Maternal Grandfather    Cancer Maternal Grandfather    Diabetes Maternal Grandfather    Heart failure Neg Hx    Social History   Tobacco Use    Smoking status: Former    Current packs/day: 0.00    Types: Cigarettes    Quit date: 04/09/2018    Years since quitting: 5.5    Passive exposure: Past   Smokeless tobacco: Never   Tobacco comments:    Stopped smoking 3 months before she got pregnant  Vaping Use   Vaping status: Former  Substance Use Topics   Alcohol use: Not Currently   Drug use: Not Currently   Allergies  Allergen Reactions   Other Shortness Of Breath    Dust, Reports smells,perfumes,aerosols HX asthma    Latex Rash   Medications Prior to Admission  Medication Sig Dispense Refill Last Dose/Taking   ARIPiprazole  (ABILIFY ) 5 MG tablet Take 1 tablet (5 mg total) by mouth daily. 30 tablet 1 10/09/2023   busPIRone  (BUSPAR ) 7.5 MG tablet Take 1 tablet (7.5 mg total) by mouth 2 (two) times daily. 60 tablet 1 10/09/2023   ondansetron  (ZOFRAN -ODT) 4 MG disintegrating tablet Take 1 tablet (4 mg total) by  mouth every 6 (six) hours as needed for nausea or vomiting. 10 tablet 0 Past Week   Prenatal Vit-Fe Fumarate-FA (MULTIVITAMIN-PRENATAL) 27-0.8 MG TABS tablet Take 1 tablet by mouth daily at 12 noon.   10/08/2023   propranolol  (INDERAL ) 10 MG tablet Take 1 tablet (10 mg total) by mouth at bedtime. 90 tablet 3 Past Week   acetaminophen  (TYLENOL ) 500 MG tablet Take 2 tablets (1,000 mg total) by mouth every 6 (six) hours as needed for mild pain.      ALBUTEROL  IN Inhale 1-2 puffs into the lungs as needed.   More than a month   beclomethasone (QVAR ) 80 MCG/ACT inhaler Inhale 2 puffs into the lungs daily as needed (for shortenss of breath).   More than a month   cefadroxil  (DURICEF) 500 MG capsule Take 1 capsule (500 mg total) by mouth 2 (two) times daily. 10 capsule 0 More than a month   hydrOXYzine  (ATARAX ) 25 MG tablet Take 2 tablets (50 mg total) by mouth 3 (three) times daily. 60 tablet 2 More than a month   oxyCODONE  (OXY IR/ROXICODONE ) 5 MG immediate release tablet Take 1 tablet (5 mg total) by mouth every 6 (six) hours as  needed for severe pain (pain score 7-10) or breakthrough pain. 8 tablet 0 More than a month    I have reviewed patient's Past Medical Hx, Surgical Hx, Family Hx, Social Hx, medications and allergies.   ROS  Pertinent items noted in HPI and remainder of comprehensive ROS otherwise negative.   PHYSICAL EXAM  Patient Vitals for the past 24 hrs:  BP Temp Temp src Pulse Resp SpO2  10/09/23 1900 (!) 88/69 -- -- (!) 101 -- --  10/09/23 1846 (!) 103/45 -- -- 90 -- --  10/09/23 1830 118/64 -- -- 89 -- --  10/09/23 1818 108/64 -- -- 95 -- --  10/09/23 1809 (!) 125/92 -- -- (!) 165 -- --  10/09/23 1656 137/82 98.5 F (36.9 C) Oral (!) 121 16 100 %    Constitutional: Well-developed, obese female in no acute distress.  Cardiovascular: tachycardia,  warm and well-perfused Respiratory: normal effort, no problems with respiration noted GI: Abd soft, non-tender, no ruq pain, limited exam due to increased body habitus MS: Extremities nontender, no edema, normal ROM Neurologic: Alert and oriented x 4.  GU: no CVA tenderness     Fetal Tracing: 137 Via Doppler   Labs: Results for orders placed or performed during the hospital encounter of 10/09/23 (from the past 24 hours)  Glucose, capillary     Status: None   Collection Time: 10/09/23  5:13 PM  Result Value Ref Range   Glucose-Capillary 80 70 - 99 mg/dL  Protein / creatinine ratio, urine     Status: None   Collection Time: 10/09/23  5:33 PM  Result Value Ref Range   Creatinine, Urine 203 mg/dL   Total Protein, Urine 14 mg/dL   Protein Creatinine Ratio 0.07 0.00 - 0.15 mg/mg[Cre]  CBC     Status: Abnormal   Collection Time: 10/09/23  6:00 PM  Result Value Ref Range   WBC 8.5 4.0 - 10.5 K/uL   RBC 4.13 3.87 - 5.11 MIL/uL   Hemoglobin 11.3 (L) 12.0 - 15.0 g/dL   HCT 65.2 (L) 63.9 - 53.9 %   MCV 84.0 80.0 - 100.0 fL   MCH 27.4 26.0 - 34.0 pg   MCHC 32.6 30.0 - 36.0 g/dL   RDW 84.9 88.4 - 84.4 %   Platelets 241 150 -  400 K/uL   nRBC 0.0  0.0 - 0.2 %  Comprehensive metabolic panel     Status: Abnormal   Collection Time: 10/09/23  6:00 PM  Result Value Ref Range   Sodium 138 135 - 145 mmol/L   Potassium 3.3 (L) 3.5 - 5.1 mmol/L   Chloride 109 98 - 111 mmol/L   CO2 19 (L) 22 - 32 mmol/L   Glucose, Bld 80 70 - 99 mg/dL   BUN <5 (L) 6 - 20 mg/dL   Creatinine, Ser 9.47 0.44 - 1.00 mg/dL   Calcium 8.6 (L) 8.9 - 10.3 mg/dL   Total Protein 6.0 (L) 6.5 - 8.1 g/dL   Albumin 2.7 (L) 3.5 - 5.0 g/dL   AST 16 15 - 41 U/L   ALT 16 0 - 44 U/L   Alkaline Phosphatase 73 38 - 126 U/L   Total Bilirubin 0.5 0.0 - 1.2 mg/dL   GFR, Estimated >39 >39 mL/min   Anion gap 10 5 - 15  Urinalysis, Routine w reflex microscopic -Urine, Clean Catch     Status: Abnormal   Collection Time: 10/09/23  6:25 PM  Result Value Ref Range   Color, Urine AMBER (A) YELLOW   APPearance CLOUDY (A) CLEAR   Specific Gravity, Urine 1.027 1.005 - 1.030   pH 5.0 5.0 - 8.0   Glucose, UA NEGATIVE NEGATIVE mg/dL   Hgb urine dipstick NEGATIVE NEGATIVE   Bilirubin Urine NEGATIVE NEGATIVE   Ketones, ur 5 (A) NEGATIVE mg/dL   Protein, ur NEGATIVE NEGATIVE mg/dL   Nitrite NEGATIVE NEGATIVE   Leukocytes,Ua SMALL (A) NEGATIVE   RBC / HPF 0-5 0 - 5 RBC/hpf   WBC, UA 11-20 0 - 5 WBC/hpf   Bacteria, UA FEW (A) NONE SEEN   Squamous Epithelial / HPF 11-20 0 - 5 /HPF   Mucus PRESENT     Imaging:  No results found.  MDM & MAU COURSE  MDM:  HIGH  CBC: NM CMP: Potassium at 3.3 ( Will plan for Discharge RX for supplementation) P/C Ratio: 0.07 UA: no evidence of UTI BP Monitoring: Normotensive /hypotensive with one outlier of 125/92  EKG: NSR  Excedrin for HA: patient reports HA resolve with Excedrin  No evidence of hypertensive disorder at this time  Differential Diagnosis of Hypertension in pregnancy can be preexisting chronic hypertension, Gestational hypertension, preeclampsia, preeclampsia with Severe features, Chronic hypertension with superimposed  Preeclampsia, HELLP Syndrome, all of these conditions can lead to Eclampsia, pulmonary edema, IUGR, and/or placenta abruption. Precise diagnosis is often challenging. High clinical suspicion is warranted given the increase in maternal and fetal-neonatal risks associated with preeclampsia.   MAU Course: Orders Placed This Encounter  Procedures   Urinalysis, Routine w reflex microscopic -Urine, Clean Catch   Glucose, capillary   CBC   Comprehensive metabolic panel   Protein / creatinine ratio, urine   Measure blood pressure   ED EKG   Discharge patient Discharge disposition: 01-Home or Self Care; Discharge patient date: 10/09/2023   Meds ordered this encounter  Medications   acetaminophen -caffeine  (EXCEDRIN TENSION HEADACHE) 500-65 MG per tablet 2 tablet   potassium chloride SA (KLOR-CON M) 20 MEQ tablet    Sig: Take 1 tablet (20 mEq total) by mouth 2 (two) times daily for 3 days.    Dispense:  6 tablet    Refill:  0    Supervising Provider:   PRATT, TANYA S [[id number]]    I have reviewed the patient chart and performed the physical exam .  I have ordered & interpreted the lab results and reviewed them with the patient Medications ordered as stated above/ below.  A/P as described below.  Counseling and education provided and patient agreeable  with plan as described below. Verbalized understanding.    ASSESSMENT   1. Elevated blood pressure reading   2. Nonintractable headache, unspecified chronicity pattern, unspecified headache type   3. [redacted] weeks gestation of pregnancy   4. Hypokalemia     PLAN  Discharge home in stable condition with return precautions.   Follow up with OB as scheduled  List of safe medications in pregnancy provided   See AVS for full description of information given to the patient including both verbal and written. Patient verbalized understanding and agrees with the plan as described above.      Allergies as of 10/09/2023       Reactions   Other Shortness  Of Breath   Dust, Reports smells,perfumes,aerosols HX asthma   Latex Rash        Medication List     TAKE these medications    acetaminophen  500 MG tablet Commonly known as: TYLENOL  Take 2 tablets (1,000 mg total) by mouth every 6 (six) hours as needed for mild pain.   ALBUTEROL  IN Inhale 1-2 puffs into the lungs as needed.   ARIPiprazole  5 MG tablet Commonly known as: Abilify  Take 1 tablet (5 mg total) by mouth daily.   beclomethasone 80 MCG/ACT inhaler Commonly known as: QVAR  Inhale 2 puffs into the lungs daily as needed (for shortenss of breath).   busPIRone  7.5 MG tablet Commonly known as: BUSPAR  Take 1 tablet (7.5 mg total) by mouth 2 (two) times daily.   cefadroxil  500 MG capsule Commonly known as: DURICEF Take 1 capsule (500 mg total) by mouth 2 (two) times daily.   hydrOXYzine  25 MG tablet Commonly known as: ATARAX  Take 2 tablets (50 mg total) by mouth 3 (three) times daily.   multivitamin-prenatal 27-0.8 MG Tabs tablet Take 1 tablet by mouth daily at 12 noon.   ondansetron  4 MG disintegrating tablet Commonly known as: ZOFRAN -ODT Take 1 tablet (4 mg total) by mouth every 6 (six) hours as needed for nausea or vomiting.   oxyCODONE  5 MG immediate release tablet Commonly known as: Oxy IR/ROXICODONE  Take 1 tablet (5 mg total) by mouth every 6 (six) hours as needed for severe pain (pain score 7-10) or breakthrough pain.   potassium chloride SA 20 MEQ tablet Commonly known as: KLOR-CON M Take 1 tablet (20 mEq total) by mouth 2 (two) times daily for 3 days.   propranolol  10 MG tablet Commonly known as: INDERAL  Take 1 tablet (10 mg total) by mouth at bedtime.        Olam Dalton, MSN, [facility], [facility] for [facility]    This chart was dictated using voice recognition software, Dragon. Despite the best efforts of this provider to proofread and correct errors, errors may still occur which can change documentation  meaning.

## 2023-10-09 NOTE — Discharge Instructions (Signed)
[place] for [employer] at [employer] [facility]    Phone: [place] for [employer] at [place]   Phone: [place] for [employer] at [place]  Phone: [place] for [employer] at [facility] [employer]  Phone: [place] for [employer] at [place]  Phone: [place] for [place] at [facility]   Phone: [facility]       Phone: [phone]  [facility]    Phone: [phone]   [facility]    Phone: [phone]  [facility]    Phone: [place]    Phone: [phone]  [place] [facility]       Phone: [phone]   [place] [facility]  Phone: [place]    Phone: [phone]  [facility]   Phone: [phone]  [facility]      Phone: [phone]  [facility]    Phone: [phone]

## 2023-11-02 DIAGNOSIS — L0591 Pilonidal cyst without abscess: Secondary | ICD-10-CM | POA: Insufficient documentation

## 2023-11-06 ENCOUNTER — Telehealth: Payer: Self-pay

## 2023-11-06 NOTE — Telephone Encounter (Signed)
 Patient called RN line regarding having swelling in hand and feet. Would like a call back.  Rosaline, RN

## 2023-11-10 NOTE — Telephone Encounter (Signed)
 Pt states swelling has improved, she is no longer concerned.  She denies any headache, visual disturbances, or chest pain.  Advised pt of MAU precautions and address if urgent symptoms occur.  She verbalized understanding and had no further questions at this time.    [place], RN

## 2023-11-11 ENCOUNTER — Encounter (HOSPITAL_COMMUNITY): Payer: Self-pay | Admitting: Family Medicine

## 2023-11-11 ENCOUNTER — Inpatient Hospital Stay (HOSPITAL_COMMUNITY)
Admission: AD | Admit: 2023-11-11 | Discharge: 2023-11-11 | Disposition: A | Discharge status: Home / Self Care | Payer: MEDICAID | Attending: Family Medicine | Admitting: Family Medicine

## 2023-11-11 DIAGNOSIS — K089 Disorder of teeth and supporting structures, unspecified: Secondary | ICD-10-CM | POA: Diagnosis not present

## 2023-11-11 DIAGNOSIS — K047 Periapical abscess without sinus: Secondary | ICD-10-CM | POA: Diagnosis not present

## 2023-11-11 DIAGNOSIS — O26891 Other specified pregnancy related conditions, first trimester: Secondary | ICD-10-CM | POA: Diagnosis not present

## 2023-11-11 DIAGNOSIS — Z91040 Latex allergy status: Secondary | ICD-10-CM | POA: Diagnosis not present

## 2023-11-11 DIAGNOSIS — O26892 Other specified pregnancy related conditions, second trimester: Secondary | ICD-10-CM | POA: Insufficient documentation

## 2023-11-11 DIAGNOSIS — Z3A25 25 weeks gestation of pregnancy: Secondary | ICD-10-CM | POA: Diagnosis not present

## 2023-11-11 DIAGNOSIS — Z3492 Encounter for supervision of normal pregnancy, unspecified, second trimester: Secondary | ICD-10-CM

## 2023-11-11 DIAGNOSIS — G8929 Other chronic pain: Secondary | ICD-10-CM | POA: Diagnosis not present

## 2023-11-11 MED ORDER — OXYCODONE HCL 5 MG PO TABS
10.0000 mg | ORAL_TABLET | Freq: Once | ORAL | Status: AC
  Administered 2023-11-11: 10 mg via ORAL
  Filled 2023-11-11: qty 2

## 2023-11-11 MED ORDER — AMOXICILLIN-POT CLAVULANATE 875-125 MG PO TABS: 1.0000 | ORAL_TABLET | Freq: Two times a day (BID) | ORAL | 0 refills | Status: DC

## 2023-11-11 MED ORDER — HYDROCODONE-ACETAMINOPHEN 5-325 MG PO TABS: 2.0000 | ORAL_TABLET | ORAL | 0 refills | Status: DC | PRN

## 2023-11-11 NOTE — MAU Note (Signed)
..  Emily Golden is a 23 y.o. at [redacted]w[redacted]d here in MAU reporting: Left upper and lower tooth pain, reports she had a failed root canal on upper molar and has a crack in the lower molar.  Last time she saw the dentist was beginning of 2024. Reports  Took extra strength tylenol  and oragel but none have helped.  Denies abdominal pain, vaginal bleeding, leaking of fluid. Reports a decrease in fetal movement in the last 2 days.  Pain score: 8/10 Vitals:   11/11/23 2049  BP: 123/83  Pulse: (!) 111  Resp: 19  Temp: 98.2 F (36.8 C)  SpO2: 100%     FHT: 145 Lab orders placed from triage:

## 2023-11-11 NOTE — Discharge Instructions (Addendum)
                                                   Dentists Accepting Pregnant Patients  [facility]) [facility] [address] [place]oon [phone] Up to 21yo; 2nd trimester only Language line available Fax referral to [phone] Medicaid, sliding scale  [facility] [address]/ [phone] OR  [address] F/ [phone] Up to 22yo only Language line available  Medicaid, [place] Health Choice, dental insurance  [facility] [place] [address] [place] [phone] Up to 21yo only Language line available  Medicaid, [place] Health Choice, sliding scale, dental insurance  [facility] [address] [phone] Up to 21yo only Language line available  Medicaid, [place] Health Choice, sliding scale  [facility] [place] [address] [phone] Up to 21yo only Language line available  Medicaid, [place] Health Choice, dental insurance  [place] [facility] [address] [phone] Up to 21yo only Language line available  Medicaid, [place] Health Choice, dental insurance, sliding scale  [facility] [place] [address] [phone] Up to 22yo only Language line available  Medicaid, [place] Health Choice, dental insurance          [email or web address]  [email or web address]

## 2023-11-11 NOTE — MAU Provider Note (Signed)
 Chief Complaint:  Dental Pain   HPI   None     Emily Golden is a 23 y.o. H3E8958 at [redacted]w[redacted]d who presents to maternity admissions reporting serve tooth pain. She reports a botched root canal pain is throbbing in nature. 9/10. She also reports a bottom tooth that broke with her last pregnancy  Was seen recently for the tooth pain about 2 weeks ago. She reports being prescribed an antibiotic for the infection. Started with an A  Last took 1g tylenol  3 hours ago.  She reports she had decreased fetal movement today. Reports good fetal movement now.  She reports she has been having difficulty eating due to pain. Has been drinking with a straw to manage drinking.   She reports she only has Medicaid and her dentist stopped accepting medicaid.   [facility]  Past Medical History:  Diagnosis Date  . Allergy   . Anxiety   . Asthma   . Bipolar 2 disorder (HCC)   . Gallstones   . PTSD (post-traumatic stress disorder)   . SVT (supraventricular tachycardia)    OB History  Gravida Para Term Preterm AB Living  6 1 1  0 4 1  SAB IAB Ectopic Multiple Live Births  4 0 0  1    # Outcome Date GA Lbr Len/2nd Weight Sex Type Anes PTL Lv  6 Current           5 Term 12/20/22 105w4d 01:11 / 00:25 3340 g M Vag-Spont EPI  LIV  4 SAB 10/04/21 [redacted]w[redacted]d   U         Birth Comments: SAB managed with cytotec   3 SAB 2022          2 SAB         ND  1 SAB            Past Surgical History:  Procedure Laterality Date  . CHOLECYSTECTOMY    . compound fracture right arm Right   . NASAL RECONSTRUCTION     Family History  Problem Relation Age of Onset  . Diabetes Maternal Grandmother   . Hypertension Maternal Grandmother   . Hyperlipidemia Maternal Grandmother   . Heart disease Maternal Grandfather   . Cancer Maternal Grandfather   . Diabetes Maternal Grandfather   . Heart failure Neg Hx    Social History   Tobacco Use  . Smoking status: Former    Current packs/day: 0.00    Types:  Cigarettes    Quit date: 04/09/2018    Years since quitting: 5.5    Passive exposure: Past  . Smokeless tobacco: Never  . Tobacco comments:    Stopped smoking 3 months before she got pregnant  Vaping Use  . Vaping status: Former  Substance Use Topics  . Alcohol use: Not Currently  . Drug use: Not Currently   Allergies  Allergen Reactions  . Other Shortness Of Breath    Dust, Reports smells,perfumes,aerosols HX asthma   . Latex Rash   Medications Prior to Admission  Medication Sig Dispense Refill Last Dose/Taking  . acetaminophen  (TYLENOL ) 500 MG tablet Take 2 tablets (1,000 mg total) by mouth every 6 (six) hours as needed for mild pain.     . ALBUTEROL  IN Inhale 1-2 puffs into the lungs as needed.     . ARIPiprazole  (ABILIFY ) 5 MG tablet Take 1 tablet (5 mg total) by mouth daily. 30 tablet 1   . beclomethasone (QVAR ) 80 MCG/ACT inhaler Inhale 2 puffs  into the lungs daily as needed (for shortenss of breath).     . busPIRone  (BUSPAR ) 7.5 MG tablet Take 1 tablet (7.5 mg total) by mouth 2 (two) times daily. 60 tablet 1   . cefadroxil  (DURICEF) 500 MG capsule Take 1 capsule (500 mg total) by mouth 2 (two) times daily. 10 capsule 0   . hydrOXYzine  (ATARAX ) 25 MG tablet Take 2 tablets (50 mg total) by mouth 3 (three) times daily. 60 tablet 2   . ondansetron  (ZOFRAN -ODT) 4 MG disintegrating tablet Take 1 tablet (4 mg total) by mouth every 6 (six) hours as needed for nausea or vomiting. 10 tablet 0   . oxyCODONE  (OXY IR/ROXICODONE ) 5 MG immediate release tablet Take 1 tablet (5 mg total) by mouth every 6 (six) hours as needed for severe pain (pain score 7-10) or breakthrough pain. 8 tablet 0   . potassium chloride SA (KLOR-CON M) 20 MEQ tablet Take 1 tablet (20 mEq total) by mouth 2 (two) times daily for 3 days. 6 tablet 0   . Prenatal Vit-Fe Fumarate-FA (MULTIVITAMIN-PRENATAL) 27-0.8 MG TABS tablet Take 1 tablet by mouth daily at 12 noon.     . propranolol  (INDERAL ) 10 MG tablet Take 1  tablet (10 mg total) by mouth at bedtime. 90 tablet 3     I have reviewed patient's Past Medical Hx, Surgical Hx, Family Hx, Social Hx, medications and allergies.   ROS  Pertinent items noted in HPI and remainder of comprehensive ROS otherwise negative.   PHYSICAL EXAM  Patient Vitals for the past 24 hrs:  BP Temp Temp src Pulse Resp SpO2 Height Weight  11/11/23 2049 123/83 98.2 F (36.8 C) Oral (!) 111 19 100 % 5' 7.5 (1.715 m) (!) 145.2 kg    Physical Exam      Fetal Tracing: Baseline: *** Variability: Accelerations:  Decelerations: [place]oco:    Labs: No results found for this or any previous visit (from the past 24 hours).  Imaging:  No results found.  MDM & MAU COURSE  MDM:  MAU Course: No orders of the defined types were placed in this encounter.  No orders of the defined types were placed in this encounter.   ASSESSMENT  No diagnosis found.  PLAN  Discharge home in stable condition.  ***    Allergies as of 11/11/2023       Reactions   Other Shortness Of Breath   Dust, Reports smells,perfumes,aerosols HX asthma   Latex Rash     Med Rec must be completed prior to using this SMARTLINK***       Camie Rote, MSN, CNM 11/11/2023 9:05 PM  Certified Nurse Midwife, [facility]

## 2023-11-14 ENCOUNTER — Encounter (HOSPITAL_COMMUNITY): Payer: Self-pay | Admitting: Obstetrics & Gynecology

## 2023-11-14 ENCOUNTER — Inpatient Hospital Stay (HOSPITAL_COMMUNITY): Payer: MEDICAID

## 2023-11-14 ENCOUNTER — Inpatient Hospital Stay (HOSPITAL_COMMUNITY)
Admission: AD | Admit: 2023-11-14 | Discharge: 2023-11-14 | Disposition: A | Discharge status: Home / Self Care | Payer: MEDICAID | Attending: Obstetrics & Gynecology | Admitting: Obstetrics & Gynecology

## 2023-11-14 DIAGNOSIS — O26892 Other specified pregnancy related conditions, second trimester: Secondary | ICD-10-CM

## 2023-11-14 DIAGNOSIS — R109 Unspecified abdominal pain: Secondary | ICD-10-CM

## 2023-11-14 DIAGNOSIS — N939 Abnormal uterine and vaginal bleeding, unspecified: Secondary | ICD-10-CM

## 2023-11-14 DIAGNOSIS — O4692 Antepartum hemorrhage, unspecified, second trimester: Secondary | ICD-10-CM | POA: Diagnosis not present

## 2023-11-14 DIAGNOSIS — Z3A26 26 weeks gestation of pregnancy: Secondary | ICD-10-CM | POA: Diagnosis not present

## 2023-11-14 DIAGNOSIS — O26852 Spotting complicating pregnancy, second trimester: Secondary | ICD-10-CM | POA: Diagnosis not present

## 2023-11-14 DIAGNOSIS — R102 Pelvic and perineal pain unspecified side: Secondary | ICD-10-CM | POA: Diagnosis not present

## 2023-11-14 DIAGNOSIS — O26899 Other specified pregnancy related conditions, unspecified trimester: Secondary | ICD-10-CM

## 2023-11-14 LAB — CBC
HCT: 34.7 % — ABNORMAL LOW (ref 36.0–46.0)
Hemoglobin: 11.4 g/dL — ABNORMAL LOW (ref 12.0–15.0)
MCH: 27.7 pg (ref 26.0–34.0)
MCHC: 32.9 g/dL (ref 30.0–36.0)
MCV: 84.4 fL (ref 80.0–100.0)
Platelets: 279 K/uL (ref 150–400)
RBC: 4.11 MIL/uL (ref 3.87–5.11)
RDW: 14.6 % (ref 11.5–15.5)
WBC: 9.6 K/uL (ref 4.0–10.5)
nRBC: 0 % (ref 0.0–0.2)

## 2023-11-14 LAB — URINALYSIS, ROUTINE W REFLEX MICROSCOPIC
Bilirubin Urine: NEGATIVE
Glucose, UA: NEGATIVE mg/dL
Hgb urine dipstick: NEGATIVE
Ketones, ur: 5 mg/dL — AB
Nitrite: NEGATIVE
Protein, ur: 30 mg/dL — AB
Specific Gravity, Urine: 1.027 (ref 1.005–1.030)
pH: 6 (ref 5.0–8.0)

## 2023-11-14 LAB — WET PREP, GENITAL
Clue Cells Wet Prep HPF POC: NONE SEEN
Sperm: NONE SEEN
Trich, Wet Prep: NONE SEEN
WBC, Wet Prep HPF POC: <10 (ref <10)
Yeast Wet Prep HPF POC: NONE SEEN

## 2023-11-14 MED ORDER — ACETAMINOPHEN 500 MG PO TABS
1000.0000 mg | ORAL_TABLET | Freq: Once | ORAL | Status: AC
  Administered 2023-11-14: 1000 mg via ORAL
  Filled 2023-11-14: qty 2

## 2023-11-14 MED ORDER — CYCLOBENZAPRINE HCL 5 MG PO TABS
10.0000 mg | ORAL_TABLET | Freq: Once | ORAL | Status: AC
  Administered 2023-11-14: 10 mg via ORAL
  Filled 2023-11-14: qty 2

## 2023-11-14 NOTE — MAU Note (Signed)
 Emily Golden is a 23 y.o. at [redacted]w[redacted]d here in MAU reporting about 1000 when I wiped there was small amt blood on tissue. About an hour ago when I wiped there was more than before and a pea-sized blood clot. Having period-like cramps in abdomen and back. Stabbing pain in L lateral side. Reports good FM and no LOF   LMP: na Onset of complaint: today Pain score: 4 Vitals:   11/14/23 2136 11/14/23 2137  BP:  118/74  Pulse: (!) 122   Resp: 19   Temp: 98.3 F (36.8 C)   SpO2: 100%      FHT: 145  Lab orders placed from triage: orders placed by provider

## 2023-11-14 NOTE — MAU Provider Note (Signed)
 History     [id number]  Arrival date and time: 11/14/23 2120    Chief Complaint  Patient presents with   Abdominal Pain   Vaginal Bleeding     HPI Emily Golden is a 23 y.o. at [redacted]w[redacted]d who receives care at [facility] who presents for vaginal bleeding and left sided abdominal pain. It started this morning. She noticed a little blood when she wiped but then no more bleeding all day until this evening when she noticed a pea sized clot on the toilet paper. Last sexual activity was 4 days ago. Denies dizziness, near syncope. Denies dysuria, increased frequency or urgency. Denies LOF. Endorses mild cramping and good FM.        Past Medical History:  Diagnosis Date   Allergy    Anxiety    Asthma    Bipolar 2 disorder (HCC)    Gallstones    PTSD (post-traumatic stress disorder)    SVT (supraventricular tachycardia)     Past Surgical History:  Procedure Laterality Date   CHOLECYSTECTOMY     compound fracture right arm Right    NASAL RECONSTRUCTION      Family History  Problem Relation Age of Onset   Diabetes Maternal Grandmother    Hypertension Maternal Grandmother    Hyperlipidemia Maternal Grandmother    Heart disease Maternal Grandfather    Cancer Maternal Grandfather    Diabetes Maternal Grandfather    Heart failure Neg Hx     Social History   Socioeconomic History   Marital status: Married    Spouse name: Not on file   Number of children: Not on file   Years of education: Not on file   Highest education level: Not on file  Occupational History   Not on file  Tobacco Use   Smoking status: Former    Current packs/day: 0.00    Types: Cigarettes    Quit date: 04/09/2018    Years since quitting: 5.6    Passive exposure: Past   Smokeless tobacco: Never   Tobacco comments:    Stopped smoking 3 months before she got pregnant  Vaping Use   Vaping status: Former  Substance and Sexual Activity   Alcohol use: Not Currently   Drug use: Not Currently    Sexual activity: Not Currently    Comment: last ic 11/10/23  Other Topics Concern   Not on file  Social History Narrative   Is in 10th grade at [employer]   Social Drivers of Health   Financial Resource Strain: Not on file  Food Insecurity: Low Risk  (11/02/2023)   Received from [facility]   Hunger Vital Sign    Within the past 12 months, you worried that your food would run out before you got money to buy more: Never true    Within the past 12 months, the food you bought just didn't last and you didn't have money to get more. : Never true  Recent Concern: Food Insecurity - Food Insecurity Present (10/09/2023)   Hunger Vital Sign    Worried About Running Out of Food in the Last Year: Sometimes true    Ran Out of Food in the Last Year: Sometimes true  Transportation Needs: No Transportation Needs (11/02/2023)   Received from [facility]    In the past 12 months, has lack of reliable transportation kept you from medical appointments, meetings, work or from getting things needed for daily living? : No  Physical  Activity: Not on file  Stress: Not on file  Social Connections: Not on file  Intimate Partner Violence: Not At Risk (10/09/2023)   Humiliation, Afraid, Rape, and Kick questionnaire    Fear of Current or Ex-Partner: No    Emotionally Abused: No    Physically Abused: No    Sexually Abused: No    Allergies  Allergen Reactions   Other Shortness Of Breath    Dust, Reports smells,perfumes,aerosols HX asthma    Latex Rash    No current facility-administered medications on file prior to encounter.   Current Outpatient Medications on File Prior to Encounter  Medication Sig Dispense Refill   acetaminophen  (TYLENOL ) 500 MG tablet Take 2 tablets (1,000 mg total) by mouth every 6 (six) hours as needed for mild pain.     ALBUTEROL  IN Inhale 1-2 puffs into the lungs as needed.     ARIPiprazole  (ABILIFY ) 5 MG tablet Take 1 tablet (5 mg  total) by mouth daily. 30 tablet 1   beclomethasone (QVAR ) 80 MCG/ACT inhaler Inhale 2 puffs into the lungs daily as needed (for shortenss of breath).     busPIRone  (BUSPAR ) 7.5 MG tablet Take 1 tablet (7.5 mg total) by mouth 2 (two) times daily. 60 tablet 1   hydrOXYzine  (ATARAX ) 25 MG tablet Take 2 tablets (50 mg total) by mouth 3 (three) times daily. 60 tablet 2   ondansetron  (ZOFRAN -ODT) 4 MG disintegrating tablet Take 1 tablet (4 mg total) by mouth every 6 (six) hours as needed for nausea or vomiting. 10 tablet 0   potassium chloride SA (KLOR-CON M) 20 MEQ tablet Take 1 tablet (20 mEq total) by mouth 2 (two) times daily for 3 days. 6 tablet 0   Prenatal Vit-Fe Fumarate-FA (MULTIVITAMIN-PRENATAL) 27-0.8 MG TABS tablet Take 1 tablet by mouth daily at 12 noon.     propranolol  (INDERAL ) 10 MG tablet Take 1 tablet (10 mg total) by mouth at bedtime. 90 tablet 3    Pertinent positives and negative per HPI, all others reviewed and negative  Physical Exam   BP 118/74   Pulse (!) 122   Temp 98.3 F (36.8 C)   Resp 19   Ht 5' 7.5 (1.715 m)   Wt (!) 145.2 kg   LMP 05/15/2023   SpO2 100%   BMI 49.38 kg/m   Patient Vitals for the past 24 hrs:  BP Temp Pulse Resp SpO2 Height Weight  11/14/23 2137 118/74 -- -- -- -- -- --  11/14/23 2136 -- 98.3 F (36.8 C) (!) 122 19 100 % 5' 7.5 (1.715 m) (!) 145.2 kg    Physical Exam Vitals and nursing note reviewed. Exam conducted with a chaperone present.  Constitutional:      Appearance: She is well-developed.  HENT:     Head: Normocephalic and atraumatic.     Mouth/Throat:     Mouth: Mucous membranes are moist.  Eyes:     Extraocular Movements: Extraocular movements intact.  Cardiovascular:     Rate and Rhythm: Normal rate and regular rhythm.  Pulmonary:     Effort: Pulmonary effort is normal.  Abdominal:     Palpations: Abdomen is soft.     Tenderness: There is no abdominal tenderness.  Genitourinary:    Vagina: Normal. No bleeding.      Cervix: Discharge present. No cervical bleeding.     Comments: Cervix appears closed, mild mucous discharge from cervical os. No evidence of bleeding.  Skin:    Capillary Refill: Capillary refill takes  less than 2 seconds.  Neurological:     General: No focal deficit present.     Mental Status: She is alert.    FHT Baseline: 135 bpm Variability: Good {> 6 bpm) Accelerations: Reactive Decelerations: Absent Uterine activity: None  Labs Results for orders placed or performed during the hospital encounter of 11/14/23 (from the past 24 hours)  Wet prep, genital     Status: None   Collection Time: 11/14/23  9:27 PM   Specimen: Vaginal  Result Value Ref Range   Yeast Wet Prep HPF POC NONE SEEN NONE SEEN   Trich, Wet Prep NONE SEEN NONE SEEN   Clue Cells Wet Prep HPF POC NONE SEEN NONE SEEN   WBC, Wet Prep HPF POC <10 <10   Sperm NONE SEEN   Urinalysis, Routine w reflex microscopic -Urine, Clean Catch     Status: Abnormal   Collection Time: 11/14/23  9:48 PM  Result Value Ref Range   Color, Urine AMBER (A) YELLOW   APPearance HAZY (A) CLEAR   Specific Gravity, Urine 1.027 1.005 - 1.030   pH 6.0 5.0 - 8.0   Glucose, UA NEGATIVE NEGATIVE mg/dL   Hgb urine dipstick NEGATIVE NEGATIVE   Bilirubin Urine NEGATIVE NEGATIVE   Ketones, ur 5 (A) NEGATIVE mg/dL   Protein, ur 30 (A) NEGATIVE mg/dL   Nitrite NEGATIVE NEGATIVE   Leukocytes,Ua LARGE (A) NEGATIVE   RBC / HPF 0-5 0 - 5 RBC/hpf   WBC, UA 11-20 0 - 5 WBC/hpf   Bacteria, UA RARE (A) NONE SEEN   Squamous Epithelial / HPF 21-50 0 - 5 /HPF   Mucus PRESENT   CBC     Status: Abnormal   Collection Time: 11/14/23  9:58 PM  Result Value Ref Range   WBC 9.6 4.0 - 10.5 K/uL   RBC 4.11 3.87 - 5.11 MIL/uL   Hemoglobin 11.4 (L) 12.0 - 15.0 g/dL   HCT 65.2 (L) 63.9 - 53.9 %   MCV 84.4 80.0 - 100.0 fL   MCH 27.7 26.0 - 34.0 pg   MCHC 32.9 30.0 - 36.0 g/dL   RDW 85.3 88.4 - 84.4 %   Platelets 279 150 - 400 K/uL   nRBC 0.0 0.0 - 0.2  %    Imaging No results found.  MAU Course  Procedures  Lab Orders         Wet prep, genital         Urinalysis, Routine w reflex microscopic -Urine, Clean Catch         CBC    Meds ordered this encounter  Medications   acetaminophen  (TYLENOL ) tablet 1,000 mg   cyclobenzaprine  (FLEXERIL ) tablet 10 mg    Imaging Orders         US  MFM OB LIMITED     MDM Moderate (Level 3-4)  Assessment and Plan  Abdominal cramping affecting pregnancy  Vaginal bleeding  [redacted] weeks gestation of pregnancy  #FWB: NST: Reactive  Natika Wilborn is a 23 y.o. at [redacted]w[redacted]d who receives care at [facility] who presents for vaginal bleeding and left sided abdominal pain.  -Last sexual activity 4 days ago. -CBC with no evidence of acute bleed.  -No bleeding appreciated on SSE -U/A & wet prep not indicative of infectious process -Limited US  shows no evidence of abruption -Considerable pain relief with tylenol  and ibuprofen   -Stable to discharge home -Encouraged to follow up with regular OB provider.  -All questions answered, anticipatory guidance and detailed return precautions  provided.     Piero Mustard L Marchell Froman, MD/MHA 11/14/23 11:27 PM  Allergies as of 11/14/2023       Reactions   Other Shortness Of Breath   Dust, Reports smells,perfumes,aerosols HX asthma   Latex Rash        Medication List     STOP taking these medications    potassium chloride SA 20 MEQ tablet Commonly known as: KLOR-CON M       TAKE these medications    acetaminophen  500 MG tablet Commonly known as: TYLENOL  Take 2 tablets (1,000 mg total) by mouth every 6 (six) hours as needed for mild pain.   ALBUTEROL  IN Inhale 1-2 puffs into the lungs as needed.   amoxicillin -clavulanate 875-125 MG tablet Commonly known as: AUGMENTIN  Take 1 tablet by mouth every 12 (twelve) hours.   ARIPiprazole  5 MG tablet Commonly known as: Abilify  Take 1 tablet (5 mg total) by mouth daily.   beclomethasone 80  MCG/ACT inhaler Commonly known as: QVAR  Inhale 2 puffs into the lungs daily as needed (for shortenss of breath).   busPIRone  7.5 MG tablet Commonly known as: BUSPAR  Take 1 tablet (7.5 mg total) by mouth 2 (two) times daily.   HYDROcodone -acetaminophen  5-325 MG tablet Commonly known as: NORCO/VICODIN Take 2 tablets by mouth every 4 (four) hours as needed.   hydrOXYzine  25 MG tablet Commonly known as: ATARAX  Take 2 tablets (50 mg total) by mouth 3 (three) times daily.   multivitamin-prenatal 27-0.8 MG Tabs tablet Take 1 tablet by mouth daily at 12 noon.   ondansetron  4 MG disintegrating tablet Commonly known as: ZOFRAN -ODT Take 1 tablet (4 mg total) by mouth every 6 (six) hours as needed for nausea or vomiting.   propranolol  10 MG tablet Commonly known as: INDERAL  Take 1 tablet (10 mg total) by mouth at bedtime.

## 2023-11-15 ENCOUNTER — Encounter (HOSPITAL_COMMUNITY): Payer: MEDICAID | Admitting: Physician Assistant

## 2023-11-21 ENCOUNTER — Encounter (HOSPITAL_COMMUNITY): Payer: Self-pay | Admitting: Obstetrics and Gynecology

## 2023-11-21 ENCOUNTER — Encounter: Payer: MEDICAID | Admitting: Family Medicine

## 2023-11-21 ENCOUNTER — Other Ambulatory Visit: Payer: Self-pay

## 2023-11-21 ENCOUNTER — Inpatient Hospital Stay (HOSPITAL_COMMUNITY)
Admission: AD | Admit: 2023-11-21 | Discharge: 2023-11-21 | Disposition: A | Discharge status: Home / Self Care | Payer: MEDICAID | Attending: Family Medicine | Admitting: Family Medicine

## 2023-11-21 DIAGNOSIS — J45909 Unspecified asthma, uncomplicated: Secondary | ICD-10-CM | POA: Insufficient documentation

## 2023-11-21 DIAGNOSIS — J101 Influenza due to other identified influenza virus with other respiratory manifestations: Secondary | ICD-10-CM | POA: Insufficient documentation

## 2023-11-21 DIAGNOSIS — O99512 Diseases of the respiratory system complicating pregnancy, second trimester: Secondary | ICD-10-CM | POA: Insufficient documentation

## 2023-11-21 DIAGNOSIS — Z3A27 27 weeks gestation of pregnancy: Secondary | ICD-10-CM | POA: Diagnosis not present

## 2023-11-21 DIAGNOSIS — J069 Acute upper respiratory infection, unspecified: Secondary | ICD-10-CM

## 2023-11-21 DIAGNOSIS — R062 Wheezing: Secondary | ICD-10-CM | POA: Diagnosis present

## 2023-11-21 DIAGNOSIS — Z3689 Encounter for other specified antenatal screening: Secondary | ICD-10-CM | POA: Insufficient documentation

## 2023-11-21 DIAGNOSIS — R519 Headache, unspecified: Secondary | ICD-10-CM | POA: Diagnosis present

## 2023-11-21 DIAGNOSIS — Z8709 Personal history of other diseases of the respiratory system: Secondary | ICD-10-CM

## 2023-11-21 DIAGNOSIS — R059 Cough, unspecified: Secondary | ICD-10-CM | POA: Diagnosis present

## 2023-11-21 LAB — RESP PANEL BY RT-PCR (RSV, FLU A&B, COVID)  RVPGX2
Influenza A by PCR: POSITIVE — AB
Influenza B by PCR: NEGATIVE
Resp Syncytial Virus by PCR: NEGATIVE
SARS Coronavirus 2 by RT PCR: NEGATIVE

## 2023-11-21 LAB — URINALYSIS, ROUTINE W REFLEX MICROSCOPIC
Bilirubin Urine: NEGATIVE
Glucose, UA: NEGATIVE mg/dL
Ketones, ur: NEGATIVE mg/dL
Nitrite: NEGATIVE
Protein, ur: NEGATIVE mg/dL
Specific Gravity, Urine: 1.009 (ref 1.005–1.030)
pH: 7 (ref 5.0–8.0)

## 2023-11-21 MED ORDER — ACETAMINOPHEN 500 MG PO TABS
1000.0000 mg | ORAL_TABLET | Freq: Once | ORAL | Status: AC
  Administered 2023-11-21: 1000 mg via ORAL
  Filled 2023-11-21: qty 2

## 2023-11-21 MED ORDER — IPRATROPIUM-ALBUTEROL 0.5-2.5 (3) MG/3ML IN SOLN
3.0000 mL | Freq: Once | RESPIRATORY_TRACT | Status: AC
  Administered 2023-11-21: 3 mL via RESPIRATORY_TRACT
  Filled 2023-11-21: qty 3

## 2023-11-21 MED ORDER — OSELTAMIVIR PHOSPHATE 75 MG PO CAPS: 75.0000 mg | ORAL_CAPSULE | Freq: Two times a day (BID) | ORAL | 0 refills | Status: AC

## 2023-11-21 NOTE — MAU Provider Note (Signed)
 History     CSN: [id number]  Arrival date and time: 11/21/23 0120   Event Date/Time   First Provider Initiated Contact with Patient 11/21/23 0153      Chief Complaint  Patient presents with   Headache   Shortness of Breath   Fever   Chest Pain   Headache  Associated symptoms include coughing, a fever and a sore throat. Pertinent negatives include no abdominal pain or rhinorrhea.  Shortness of Breath Associated symptoms include chest pain, a fever, headaches and a sore throat. Pertinent negatives include no abdominal pain or rhinorrhea.  Fever  Associated symptoms include chest pain, congestion, coughing, headaches and a sore throat. Pertinent negatives include no abdominal pain or urinary pain.  Chest Pain  Associated symptoms include a cough, a fever, headaches and shortness of breath. Pertinent negatives include no abdominal pain.   Emily Golden is a 23 y.o. at [redacted]w[redacted]d who receives care at [facility] but is transferring to [facility] who presents for infectious symptoms including fever, SOB, headache. Patient has history of asthma and is on controller med and has albuterol  at home. She has not been taking her controller medications. She used her albuterol  at about noon.   Symptoms started 11/15 with sore throat. She is experiencing a wet cough that is non-productive and having body aches/myalgias.  Took Tylenol  at 1900.   Exposed to a friends husband that had had flu like sx. Her 24 month old is currently in [facility] with a high fever.   OB History     Gravida  6   Para  1   Term  1   Preterm  0   AB  4   Living  1      SAB  4   IAB  0   Ectopic  0   Multiple      Live Births  1           Past Medical History:  Diagnosis Date   Allergy    Anxiety    Asthma    Bipolar 2 disorder (HCC)    Gallstones    PTSD (post-traumatic stress disorder)    SVT (supraventricular tachycardia)     Past Surgical History:  Procedure Laterality Date    CHOLECYSTECTOMY     compound fracture right arm Right    NASAL RECONSTRUCTION      Family History  Problem Relation Age of Onset   Diabetes Maternal Grandmother    Hypertension Maternal Grandmother    Hyperlipidemia Maternal Grandmother    Heart disease Maternal Grandfather    Cancer Maternal Grandfather    Diabetes Maternal Grandfather    Heart failure Neg Hx     Social History   Tobacco Use   Smoking status: Former    Current packs/day: 0.00    Types: Cigarettes    Quit date: 04/09/2018    Years since quitting: 5.6    Passive exposure: Past   Smokeless tobacco: Never   Tobacco comments:    Stopped smoking 3 months before she got pregnant  Vaping Use   Vaping status: Former  Substance Use Topics   Alcohol use: Not Currently   Drug use: Not Currently    Allergies:  Allergies  Allergen Reactions   Other Shortness Of Breath    Dust, Reports smells,perfumes,aerosols HX asthma    Latex Rash    Medications Prior to Admission  Medication Sig Dispense Refill Last Dose/Taking   ALBUTEROL  IN Inhale 1-2 puffs into  the lungs as needed.   11/21/2023 Morning   Prenatal Vit-Fe Fumarate-FA (MULTIVITAMIN-PRENATAL) 27-0.8 MG TABS tablet Take 1 tablet by mouth daily at 12 noon.   Past Week   acetaminophen  (TYLENOL ) 500 MG tablet Take 2 tablets (1,000 mg total) by mouth every 6 (six) hours as needed for mild pain.      amoxicillin -clavulanate (AUGMENTIN ) 875-125 MG tablet Take 1 tablet by mouth every 12 (twelve) hours. 14 tablet 0    ARIPiprazole  (ABILIFY ) 5 MG tablet Take 1 tablet (5 mg total) by mouth daily. 30 tablet 1    beclomethasone (QVAR ) 80 MCG/ACT inhaler Inhale 2 puffs into the lungs daily as needed (for shortenss of breath).      busPIRone  (BUSPAR ) 7.5 MG tablet Take 1 tablet (7.5 mg total) by mouth 2 (two) times daily. 60 tablet 1    HYDROcodone -acetaminophen  (NORCO/VICODIN) 5-325 MG tablet Take 2 tablets by mouth every 4 (four) hours as needed. 20 tablet 0     hydrOXYzine  (ATARAX ) 25 MG tablet Take 2 tablets (50 mg total) by mouth 3 (three) times daily. 60 tablet 2    ondansetron  (ZOFRAN -ODT) 4 MG disintegrating tablet Take 1 tablet (4 mg total) by mouth every 6 (six) hours as needed for nausea or vomiting. 10 tablet 0    propranolol  (INDERAL ) 10 MG tablet Take 1 tablet (10 mg total) by mouth at bedtime. 90 tablet 3     Review of Systems  Constitutional:  Positive for chills and fever. Negative for activity change and appetite change.  HENT:  Positive for congestion and sore throat. Negative for rhinorrhea.   Respiratory:  Positive for cough, chest tightness and shortness of breath.   Cardiovascular:  Positive for chest pain.  Gastrointestinal:  Negative for abdominal pain.  Genitourinary:  Negative for dysuria and vaginal bleeding.  Musculoskeletal:  Positive for myalgias. Negative for arthralgias.  Neurological:  Positive for headaches.  Hematological:  Negative for adenopathy.   Physical Exam   Blood pressure 134/75, pulse (!) 121, temperature 99.4 F (37.4 C), temperature source Oral, resp. rate (!) 24, height 5' 7.5 (1.715 m), weight (!) 142.9 kg, last menstrual period 05/15/2023, SpO2 97%, unknown if currently breastfeeding.  Physical Exam Vitals and nursing note reviewed.  Constitutional:      General: She is not in acute distress.    Appearance: She is well-developed. She is ill-appearing.     Comments: Pregnant female. Speaking in full sentences  HENT:     Head: Normocephalic and atraumatic.  Eyes:     General: No scleral icterus.    Conjunctiva/sclera: Conjunctivae normal.  Cardiovascular:     Rate and Rhythm: Normal rate.  Pulmonary:     Effort: Pulmonary effort is normal.     Breath sounds: Decreased breath sounds (poor air movement in all lung fields) and wheezing present.  Chest:     Chest wall: No tenderness.  Abdominal:     Palpations: Abdomen is soft.     Tenderness: There is no abdominal tenderness. There is no  guarding or rebound.     Comments: Gravid  Genitourinary:    Vagina: Normal.  Musculoskeletal:        General: Normal range of motion.     Cervical back: Normal range of motion and neck supple.  Skin:    General: Skin is warm and dry.     Findings: No rash.  Neurological:     Mental Status: She is alert and oriented to person, place, and time.  MAU Course  Procedures NST: 135/moderate/+accels (10x10) and no decels  MDM- Moderate  Fever/Cough in the setting of likely viral illness at [redacted] weeks pregnant.  Ddx includes flu, RSV, COVID but could be other respiratory virus. Less likely PNA given sx duration. Exam s/f of restricted air movement but pulse ox is WNL. Ordered neb treatment  Orders Placed This Encounter  Procedures   Resp panel by RT-PCR (RSV, Flu A&B, Covid) Anterior Nasal Swab   Urinalysis, Routine w reflex microscopic -Urine, Clean Catch   Airborne and Contact precautions   ED EKG   Discharge patient Discharge disposition: 01-Home or Self Care; Discharge patient date: 11/21/2023   Meds ordered this encounter  Medications   ipratropium-albuterol  (DUONEB) 0.5-2.5 (3) MG/3ML nebulizer solution 3 mL   acetaminophen  (TYLENOL ) tablet 1,000 mg   oseltamivir (TAMIFLU) 75 MG capsule    Sig: Take 1 capsule (75 mg total) by mouth every 12 (twelve) hours for 5 days.    Dispense:  10 capsule    Refill:  0    EKG reviewed and was NSR, no concerns for STEMI  4:18 AM s/p breathing treatment and feeling much better. Informed about Flu A status. Discussed option for Tamiflu and patient would like to take this.  She is feeling improved and ready for discharge.    Assessment and Plan   1. Influenza A   2. Viral upper respiratory illness   3. History of asthma   4. [redacted] weeks gestation of pregnancy   5. NST (non-stress test) reactive     Discharged, stable condition to home Messaged clinic about rescheduling her appt Rx for Tamiflu sent to pharmacy Reviewed symptomatic  treatment  Future Appointments  Date Time Provider Department Center  11/21/2023  9:15 AM Fredirick Glenys RAMAN, MD [facility]     Suzen Octave [facility] 11/21/2023, 4:23 AM

## 2023-11-21 NOTE — MAU Note (Signed)
 Emily Golden is a 23 y.o. at [redacted]w[redacted]d here in MAU reporting: Saturday evening started having sore throat - throughout the night started having chest pain stabbing, dry coughs, HA, wheezing. Today having a wet cough, feeling weak, body aches, and SOB. Not having the stabbing pain as much. Took tylenol  at 1900. Also reports a fever. +FM  Onset of complaint: Saturday evening  Pain score: 4 -  chest; 7 - HA; 3 - body aches Vitals:   11/21/23 0138  BP: (!) 141/77  Pulse: 96  Resp: (!) 24  Temp: 99.4 F (37.4 C)  SpO2: 95%     FHT: 140  Lab orders placed from triage: UA; covid/flu swab

## 2023-11-21 NOTE — Discharge Instructions (Signed)
 You were seen for symptoms of a viral upper respiratory infection. You testing showed you have Influenza A  Your EKG was reassuring  You needed a breathing treatment while here at the MAU. This improved your ability to breath. You should continue to use your albuterol  inhaler every 4-6 hours PRN for trouble breathing. It is also important to use your steroid inhaler as this will also improve your symptoms.   For influenza, you were prescribed Tamiflu which can help lessen the duration and severe of symptoms.  You can take Tylenol  as need for fever and body aches. It is important to stay hydrated and drink plenty of water  Return to the MAU if - your illness is improving and then suddenly you development symptoms again, particularly a fever as this could be a sign of a bacterial infection - trouble breathing that is not resolved with your inhaler  I have sent a message to the clinic that you will not be there for your appointment and to reschedule for 1-2 weeks to allow you time to recover.

## 2023-11-29 ENCOUNTER — Ambulatory Visit: Payer: MEDICAID | Admitting: Family Medicine

## 2023-11-29 ENCOUNTER — Other Ambulatory Visit: Payer: Self-pay

## 2023-11-29 VITALS — BP 119/74 | HR 109 | Wt 313.2 lb

## 2023-11-29 DIAGNOSIS — Z3A28 28 weeks gestation of pregnancy: Secondary | ICD-10-CM

## 2023-11-29 DIAGNOSIS — O09299 Supervision of pregnancy with other poor reproductive or obstetric history, unspecified trimester: Secondary | ICD-10-CM | POA: Insufficient documentation

## 2023-11-29 DIAGNOSIS — Z8279 Family history of other congenital malformations, deformations and chromosomal abnormalities: Secondary | ICD-10-CM

## 2023-11-29 DIAGNOSIS — O0993 Supervision of high risk pregnancy, unspecified, third trimester: Secondary | ICD-10-CM | POA: Diagnosis not present

## 2023-11-29 DIAGNOSIS — F411 Generalized anxiety disorder: Secondary | ICD-10-CM

## 2023-11-29 DIAGNOSIS — O09293 Supervision of pregnancy with other poor reproductive or obstetric history, third trimester: Secondary | ICD-10-CM

## 2023-12-01 NOTE — Progress Notes (Signed)
 Subjective:   Emily Golden is a 23 y.o. [id number] at [redacted]w[redacted]d by LMP being seen today for her first obstetrical visit.  Her obstetrical history is significant for pre-eclampsia. Patient does intend to breast feed. Pregnancy history fully reviewed.  Patient reports no bleeding, no contractions, no cramping, and no leaking.  HISTORY: OB History  Gravida Para Term Preterm AB Living  6 1 1  0 4 1  SAB IAB Ectopic Multiple Live Births  4 0 0 0 1    # Outcome Date GA Lbr Len/2nd Weight Sex Type Anes PTL Lv  6 Current           5 Term 12/20/22 [redacted]w[redacted]d 01:11 / 00:25 7 lb 5.8 oz (3.34 kg) M Vag-Spont EPI  LIV     Name: Emily Golden     Apgar1: 8  Apgar5: 9  4 SAB 10/04/21 [redacted]w[redacted]d   U         Birth Comments: SAB managed with cytotec   3 SAB 2022          2 SAB         ND  1 SAB            Last pap smear was  07/25/23 and was normal Past Medical History:  Diagnosis Date   Allergy    Anxiety    Asthma    Bipolar 2 disorder (HCC)    Gallstones    PTSD (post-traumatic stress disorder)    SVT (supraventricular tachycardia)    Past Surgical History:  Procedure Laterality Date   CHOLECYSTECTOMY     compound fracture right arm Right    NASAL RECONSTRUCTION     Family History  Problem Relation Age of Onset   Diabetes Maternal Grandmother    Hypertension Maternal Grandmother    Hyperlipidemia Maternal Grandmother    Heart disease Maternal Grandfather    Cancer Maternal Grandfather    Diabetes Maternal Grandfather    Heart failure Neg Hx    Social History   Tobacco Use   Smoking status: Former    Current packs/day: 0.00    Types: Cigarettes    Quit date: 04/09/2018    Years since quitting: 5.6    Passive exposure: Past   Smokeless tobacco: Never   Tobacco comments:    Stopped smoking 3 months before she got pregnant  Vaping Use   Vaping status: Former  Substance Use Topics   Alcohol use: Not Currently   Drug use: Not Currently   Allergies  Allergen Reactions   Other  Shortness Of Breath    Dust, Reports smells,perfumes,aerosols HX asthma    Latex Rash   Current Outpatient Medications on File Prior to Visit  Medication Sig Dispense Refill   acetaminophen  (TYLENOL ) 500 MG tablet Take 2 tablets (1,000 mg total) by mouth every 6 (six) hours as needed for mild pain.     ALBUTEROL  IN Inhale 1-2 puffs into the lungs as needed.     ARIPiprazole  (ABILIFY ) 5 MG tablet Take 1 tablet (5 mg total) by mouth daily. 30 tablet 1   beclomethasone (QVAR ) 80 MCG/ACT inhaler Inhale 2 puffs into the lungs daily as needed (for shortenss of breath).     busPIRone  (BUSPAR ) 7.5 MG tablet Take 1 tablet (7.5 mg total) by mouth 2 (two) times daily. 60 tablet 1   Prenatal Vit-Fe Fumarate-FA (MULTIVITAMIN-PRENATAL) 27-0.8 MG TABS tablet Take 1 tablet by mouth daily at 12 noon.     propranolol  (INDERAL ) 10 MG  tablet Take 1 tablet (10 mg total) by mouth at bedtime. 90 tablet 3   amoxicillin -clavulanate (AUGMENTIN ) 875-125 MG tablet Take 1 tablet by mouth every 12 (twelve) hours. 14 tablet 0   HYDROcodone -acetaminophen  (NORCO/VICODIN) 5-325 MG tablet Take 2 tablets by mouth every 4 (four) hours as needed. (Patient not taking: Reported on 11/29/2023) 20 tablet 0   hydrOXYzine  (ATARAX ) 25 MG tablet Take 2 tablets (50 mg total) by mouth 3 (three) times daily. (Patient not taking: Reported on 11/29/2023) 60 tablet 2   ondansetron  (ZOFRAN -ODT) 4 MG disintegrating tablet Take 1 tablet (4 mg total) by mouth every 6 (six) hours as needed for nausea or vomiting. (Patient not taking: Reported on 11/29/2023) 10 tablet 0   No current facility-administered medications on file prior to visit.     Exam   Vitals:   11/29/23 1016  BP: 119/74  Pulse: (!) 109  Weight: (!) 313 lb 3.2 oz (142.1 kg)   Fetal Heart Rate (bpm): 135  System: General: well-developed, well-nourished female in no acute distress   Skin: normal coloration and turgor, no rashes   Neurologic: oriented, normal, negative,  normal mood   Extremities: normal strength, tone, and muscle mass, ROM of all joints is normal   HEENT PERRLA, extraocular movement intact and sclera clear, anicteric   Mouth/Teeth mucous membranes moist, pharynx normal without lesions and dental hygiene good   Neck supple and no masses   Cardiovascular: regular rate and rhythm   Respiratory:  no respiratory distress, normal breath sounds   Abdomen: soft, non-tender; bowel sounds normal; no masses,  no organomegaly     Assessment:   Pregnancy: [id number] Patient Active Problem List   Diagnosis Date Noted   Supervision of high risk pregnancy in third trimester 11/29/2023   Hx of preeclampsia, prior pregnancy, currently pregnant 11/29/2023   Pilonidal cyst 11/02/2023   Family history of congenital heart defect 08/16/2023   Maternal varicella, non-immune 11/08/2022   Hx Fetal tetralogy of Fallot affecting antepartum care of mother 10/07/2022   Symptomatic cholelithiasis 08/28/2022   Obesity in pregnancy 07/11/2022   Irritable bowel syndrome 05/26/2022   Grief 03/07/2022   Bipolar 2 disorder (HCC) 07/02/2021   GAD (generalized anxiety disorder) 07/02/2021   ADHD 07/02/2021   Tobacco use disorder 07/02/2021   Alcohol consumption binge drinking 07/02/2021   Suicide attempt by drug ingestion (HCC) 06/09/2018   PTSD (post-traumatic stress disorder)      Plan:  1. Supervision of high risk pregnancy in third trimester FHR BP appropriate today - US  MFM OB DETAIL +14 WK; Future  2. Hx of preeclampsia, prior pregnancy, currently pregnant BP within normal limits today Patient not instructed to take aspirin daily previously.  Discussed that may not be benefit from initiating aspirin at that time but told her to consider it.  If she does start we will start on 162 mg daily.  Continue to monitor for symptoms  3. Family history of tetralogy of Fallot Already had fetal echo which showed no abnormalities  4. GAD (generalized anxiety  disorder) Currently on BuSpar  and Abilify .  Follows with psychiatry.  5. [redacted] weeks gestation of pregnancy (Primary)    Initial labs drawn. Continue prenatal vitamins. Genetic Screening discussed, NIPS: results reviewed. Ultrasound discussed; fetal anatomic survey: ordered. Problem list reviewed and updated. The nature of [facility] with multiple MDs and other Advanced Practice Providers was explained to patient; also emphasized that residents, students are part of our team. Routine obstetric  precautions reviewed. No follow-ups on file.

## 2023-12-04 ENCOUNTER — Telehealth: Payer: Self-pay

## 2023-12-04 ENCOUNTER — Encounter: Payer: Self-pay | Admitting: Emergency Medicine

## 2023-12-04 ENCOUNTER — Encounter: Payer: Self-pay | Admitting: Family Medicine

## 2023-12-04 ENCOUNTER — Ambulatory Visit
Admission: EM | Admit: 2023-12-04 | Discharge: 2023-12-04 | Disposition: A | Discharge status: Home / Self Care | Payer: MEDICAID

## 2023-12-04 ENCOUNTER — Other Ambulatory Visit (HOSPITAL_COMMUNITY): Payer: Self-pay | Admitting: Physician Assistant

## 2023-12-04 ENCOUNTER — Telehealth: Payer: MEDICAID | Admitting: Emergency Medicine

## 2023-12-04 DIAGNOSIS — K0889 Other specified disorders of teeth and supporting structures: Secondary | ICD-10-CM

## 2023-12-04 DIAGNOSIS — K047 Periapical abscess without sinus: Secondary | ICD-10-CM | POA: Diagnosis not present

## 2023-12-04 DIAGNOSIS — F3181 Bipolar II disorder: Secondary | ICD-10-CM

## 2023-12-04 MED ORDER — PENICILLIN V POTASSIUM 500 MG PO TABS: 500.0000 mg | ORAL_TABLET | Freq: Three times a day (TID) | ORAL | 0 refills | Status: DC

## 2023-12-04 MED ORDER — HYDROCODONE-ACETAMINOPHEN 5-325 MG PO TABS: 2.0000 | ORAL_TABLET | ORAL | 0 refills | Status: DC | PRN

## 2023-12-04 NOTE — ED Provider Notes (Signed)
 [name]    CSN: [id number] Arrival date & time: 12/04/23  1819      History   Chief Complaint Chief Complaint  Patient presents with   Dental Pain    HPI Emily Golden is a 23 y.o. female.   Patient presents to clinic over ongoing dental pain.  Reports multiple dental issues that she has not been able to get addressed.  Disintegrating molar to the right lower drawl and issues with the right upper jaw, reports a failed root canal.  Did have a telehealth visit for this earlier in the day and was prescribed antibiotics.  She has been taking Tylenol  and this has not been helping her pain.  Had been given some Norco last month for her dental pain and this seemed to work well for her.  Patient is pregnant, [redacted] weeks gestation.  Has a dentist appointment tomorrow.  The history is provided by the patient and medical records.  Dental Pain   Past Medical History:  Diagnosis Date   Allergy    Anxiety    Asthma    Bipolar 2 disorder (HCC)    Gallstones    PTSD (post-traumatic stress disorder)    SVT (supraventricular tachycardia)     Patient Active Problem List   Diagnosis Date Noted   Supervision of high risk pregnancy in third trimester 11/29/2023   Hx of preeclampsia, prior pregnancy, currently pregnant 11/29/2023   Pilonidal cyst 11/02/2023   Family history of congenital heart defect 08/16/2023   Maternal varicella, non-immune 11/08/2022   Hx Fetal tetralogy of Fallot affecting antepartum care of mother 10/07/2022   Symptomatic cholelithiasis 08/28/2022   Obesity in pregnancy 07/11/2022   Irritable bowel syndrome 05/26/2022   Grief 03/07/2022   Bipolar 2 disorder (HCC) 07/02/2021   GAD (generalized anxiety disorder) 07/02/2021   ADHD 07/02/2021   Tobacco use disorder 07/02/2021   Alcohol consumption binge drinking 07/02/2021   Suicide attempt by drug ingestion (HCC) 06/09/2018   PTSD (post-traumatic stress disorder)     Past Surgical History:   Procedure Laterality Date   CHOLECYSTECTOMY     compound fracture right arm Right    NASAL RECONSTRUCTION      OB History     Gravida  6   Para  1   Term  1   Preterm  0   AB  4   Living  1      SAB  4   IAB  0   Ectopic  0   Multiple      Live Births  1            Home Medications    Prior to Admission medications   Medication Sig Start Date End Date Taking? Authorizing Provider  acetaminophen  (TYLENOL ) 500 MG tablet Take 2 tablets (1,000 mg total) by mouth every 6 (six) hours as needed for mild pain. 09/02/22  Yes Piscoya, Aloysius, MD  ALBUTEROL  IN Inhale 1-2 puffs into the lungs as needed.   Yes [provider]  amoxicillin -clavulanate (AUGMENTIN ) 875-125 MG tablet Take 1 tablet by mouth every 12 (twelve) hours. 11/11/23  Yes Warren-Hill, Camie LABOR, CNM  ARIPiprazole  (ABILIFY ) 5 MG tablet Take 1 tablet (5 mg total) by mouth daily. 09/27/23  Yes Nwoko, Uchenna E, PA  busPIRone  (BUSPAR ) 7.5 MG tablet Take 1 tablet (7.5 mg total) by mouth 2 (two) times daily. 09/27/23  Yes Nwoko, Uchenna E, PA  HYDROcodone -acetaminophen  (NORCO/VICODIN) 5-325 MG tablet Take 2 tablets by mouth every  4 (four) hours as needed. 12/04/23  Yes Lourene Hoston  N, FNP  naloxone (NARCAN) nasal spray 4 mg/0.1 mL Place 1 spray into the nose once. 11/11/23  Yes [provider]  Prenatal Vit-Fe Fumarate-FA (MULTIVITAMIN-PRENATAL) 27-0.8 MG TABS tablet Take 1 tablet by mouth daily at 12 noon.   Yes [provider]  propranolol  (INDERAL ) 10 MG tablet Take 1 tablet (10 mg total) by mouth at bedtime. 12/05/22  Yes Tobb, Kardie, DO  beclomethasone (QVAR ) 80 MCG/ACT inhaler Inhale 2 puffs into the lungs daily as needed (for shortenss of breath). Patient not taking: Reported on 12/04/2023    [provider]  hydrOXYzine  (ATARAX ) 25 MG tablet Take 2 tablets (50 mg total) by mouth 3 (three) times daily. Patient not taking: Reported on 11/29/2023 10/11/22   Emilio Delilah HERO,  CNM  ondansetron  (ZOFRAN -ODT) 4 MG disintegrating tablet Take 1 tablet (4 mg total) by mouth every 6 (six) hours as needed for nausea or vomiting. Patient not taking: Reported on 11/29/2023 08/31/23   Trudy Leeroy NOVAK, MD  penicillin v potassium (VEETID) 500 MG tablet Take 1 tablet (500 mg total) by mouth 3 (three) times daily for 10 days. 12/04/23 12/14/23  Richad Jon HERO, NP    Family History Family History  Problem Relation Age of Onset   Diabetes Maternal Grandmother    Hypertension Maternal Grandmother    Hyperlipidemia Maternal Grandmother    Heart disease Maternal Grandfather    Cancer Maternal Grandfather    Diabetes Maternal Grandfather    Heart failure Neg Hx     Social History Social History   Tobacco Use   Smoking status: Former    Current packs/day: 0.00    Types: Cigarettes    Quit date: 04/09/2018    Years since quitting: 5.6    Passive exposure: Past   Smokeless tobacco: Never   Tobacco comments:    Stopped smoking 3 months before she got pregnant  Vaping Use   Vaping status: Former  Substance Use Topics   Alcohol use: Not Currently   Drug use: Not Currently     Allergies   Other and Latex   Review of Systems Review of Systems  Per HPI  Physical Exam Triage Vital Signs ED Triage Vitals  Encounter Vitals Group     BP --      Girls Systolic BP Percentile --      Girls Diastolic BP Percentile --      Boys Systolic BP Percentile --      Boys Diastolic BP Percentile --      Pulse Rate 12/04/23 1922 (!) 114     Resp 12/04/23 1922 18     Temp 12/04/23 1922 97.8 F (36.6 C)     Temp Source 12/04/23 1922 Oral     SpO2 12/04/23 1922 98 %     Weight --      Height --      Head Circumference --      Peak Flow --      Pain Score 12/04/23 1917 6     Pain Loc --      Pain Education --      Exclude from Growth Chart --    No data found.  Updated Vital Signs Pulse (!) 114   Temp 97.8 F (36.6 C) (Oral)   Resp 18   LMP 05/15/2023   SpO2 98%    Breastfeeding No   Visual Acuity Right Eye Distance:   Left Eye Distance:  Bilateral Distance:    Right Eye Near:   Left Eye Near:    Bilateral Near:     Physical Exam Vitals and nursing note reviewed.  Constitutional:      Appearance: Normal appearance.  HENT:     Head: Normocephalic and atraumatic.     Right Ear: External ear normal.     Left Ear: External ear normal.     Nose: Nose normal.     Mouth/Throat:     Mouth: Mucous membranes are moist.   Eyes:     Conjunctiva/sclera: Conjunctivae normal.  Cardiovascular:     Rate and Rhythm: Normal rate.  Pulmonary:     Effort: Pulmonary effort is normal. No respiratory distress.  Skin:    General: Skin is warm and dry.  Neurological:     General: No focal deficit present.     Mental Status: She is alert and oriented to person, place, and time.  Psychiatric:        Mood and Affect: Mood normal.        Behavior: Behavior normal.      UC Treatments / Results  Labs (all labs ordered are listed, but only abnormal results are displayed) Labs Reviewed - No data to display  EKG   Radiology No results found.  Procedures Procedures (including critical care time)  Medications Ordered in UC Medications - No data to display  Initial Impression / Assessment and Plan / UC Course  I have reviewed the triage vital signs and the nursing notes.  Pertinent labs & imaging results that were available during my care of the patient were reviewed by me and considered in my medical decision making (see chart for details).  Vitals and triage reviewed, patient is hemodynamically stable.  Dental erosion to bilateral upper molars.  Encouraged to continue with penicillin.  No obvious abscess.  Afebrile in clinic.  Will send in a few Norco to get her through her dental appointment tomorrow.  Risk of narcotics on the fetus discussed.  Patient verbalized understanding.  Plan of care, follow-up care return precautions given, no  questions at this time.  Dental resources provided.    Final Clinical Impressions(s) / UC Diagnoses   Final diagnoses:  Pain, dental     Discharge Instructions      I have sent in some more pain medication that you can take sparingly as needed for the dental pain.  This medication does contain a narcotic and can cause drowsiness, sedation and constipation.  Narcotics can cause preterm birth, low birthweight and potential for developmental delays and learning difficulties in children exposed to them.  Follow-up with your dentist as scheduled, ultimately they will be the solution for the dental pain.  Return to clinic for any new or urgent symptoms.  As Urgent Care providers, we only only can evaluate you for an episodic event; and this cannot be substituted for the continued care and monitoring by your primary care provider and/or specialist. It is not unusual that a medical condition can present itself in one way, then progress or change and lead to another impression of your medical condition. If you should have any new or worsening symptoms, please go to the closest emergency department and contact your primary physician as soon as possible for further evaluation and testing.   [facility] Emergency dental service in [place], [place]  Address: [address] [place], [place] [address] Phone: [phone]  [facility] [phone] extension [phone] [address]  Dr.  Civils [phone] [address]  [place] [phone] [address] [facility]  Rescue mission [phone] extension [phone] [place], [place], [address] First come first serve for the first 10 clients.  May do simple extractions only, no wisdom teeth or surgery.  You may try the second for Thursday of the month starting at 6:30 AM.  [facility] You may call the school to see if they are still helping to provide dental care for emergent cases.      ED  Prescriptions     Medication Sig Dispense Auth. Provider   HYDROcodone -acetaminophen  (NORCO/VICODIN) 5-325 MG tablet  (Status: Discontinued) Take 2 tablets by mouth every 4 (four) hours as needed. 10 tablet Dreama, Jevante Hollibaugh  N, FNP   HYDROcodone -acetaminophen  (NORCO/VICODIN) 5-325 MG tablet Take 2 tablets by mouth every 4 (four) hours as needed. 10 tablet Dreama, Tamiki Kuba  N, FNP      I have reviewed the PDMP during this encounter.   Dreama, Berdena Cisek  N, FNP 12/04/23 2032

## 2023-12-04 NOTE — Patient Instructions (Signed)
 Emily Golden, thank you for joining Jon CHRISTELLA Belt, NP for today's virtual visit.  While this provider is not your primary care provider (PCP), if your PCP is located in our provider database this encounter information will be shared with them immediately following your visit.   A [facility] MyChart account gives you access to today's visit and all your visits, tests, and labs performed at [facility]  click here if you don't have a [facility] MyChart account or go to [email or web address]  Consent: (Patient) Bekah Damian provided verbal consent for this virtual visit at the beginning of the encounter.  Current Medications:  Current Outpatient Medications:    penicillin  v potassium (VEETID) 500 MG tablet, Take 1 tablet (500 mg total) by mouth 3 (three) times daily for 10 days., Disp: 30 tablet, Rfl: 0   acetaminophen  (TYLENOL ) 500 MG tablet, Take 2 tablets (1,000 mg total) by mouth every 6 (six) hours as needed for mild pain., Disp: , Rfl:    ALBUTEROL  IN, Inhale 1-2 puffs into the lungs as needed., Disp: , Rfl:    amoxicillin -clavulanate (AUGMENTIN ) 875-125 MG tablet, Take 1 tablet by mouth every 12 (twelve) hours., Disp: 14 tablet, Rfl: 0   ARIPiprazole  (ABILIFY ) 5 MG tablet, Take 1 tablet (5 mg total) by mouth daily., Disp: 30 tablet, Rfl: 1   beclomethasone (QVAR ) 80 MCG/ACT inhaler, Inhale 2 puffs into the lungs daily as needed (for shortenss of breath)., Disp: , Rfl:    busPIRone  (BUSPAR ) 7.5 MG tablet, Take 1 tablet (7.5 mg total) by mouth 2 (two) times daily., Disp: 60 tablet, Rfl: 1   HYDROcodone -acetaminophen  (NORCO/VICODIN) 5-325 MG tablet, Take 2 tablets by mouth every 4 (four) hours as needed. (Patient not taking: Reported on 11/29/2023), Disp: 20 tablet, Rfl: 0   hydrOXYzine  (ATARAX ) 25 MG tablet, Take 2 tablets (50 mg total) by mouth 3 (three) times daily. (Patient not taking: Reported on 11/29/2023), Disp: 60 tablet, Rfl: 2   ondansetron  (ZOFRAN -ODT) 4 MG  disintegrating tablet, Take 1 tablet (4 mg total) by mouth every 6 (six) hours as needed for nausea or vomiting. (Patient not taking: Reported on 11/29/2023), Disp: 10 tablet, Rfl: 0   Prenatal Vit-Fe Fumarate-FA (MULTIVITAMIN-PRENATAL) 27-0.8 MG TABS tablet, Take 1 tablet by mouth daily at 12 noon., Disp: , Rfl:    propranolol  (INDERAL ) 10 MG tablet, Take 1 tablet (10 mg total) by mouth at bedtime., Disp: 90 tablet, Rfl: 3   Medications ordered in this encounter:  Meds ordered this encounter  Medications   penicillin  v potassium (VEETID) 500 MG tablet    Sig: Take 1 tablet (500 mg total) by mouth 3 (three) times daily for 10 days.    Dispense:  30 tablet    Refill:  0     *If you need refills on other medications prior to your next appointment, please contact your pharmacy*  Follow-Up: Call back or seek an in-person evaluation if the symptoms worsen or if the condition fails to improve as anticipated.  [facility] [phone]  Other Instructions Follow up with dentist tomorrow as scheduled. You can take tylenol  for pain per package directions. If that is not enough to help your pain, you will need to be seen in person for something stronger.    If you have been instructed to have an in-person evaluation today at a local Urgent Care facility, please use the link below. It will take you to a list of all of our available [facility], including address, phone number and hours of operation. Please do not delay care.  [facility]  If you or a family member do not have a primary care provider, use the link below to schedule a visit and establish care. When you choose a [facility] primary care physician or advanced practice provider, you gain a long-term partner in health. Find a Primary Care Provider  Learn more about [facility]'s in-office and virtual care options: [facility] - Get Care Now

## 2023-12-04 NOTE — Discharge Instructions (Addendum)
 I have sent in some more pain medication that you can take sparingly as needed for the dental pain.  This medication does contain a narcotic and can cause drowsiness, sedation and constipation.  Narcotics can cause preterm birth, low birthweight and potential for developmental delays and learning difficulties in children exposed to them.  Follow-up with your dentist as scheduled, ultimately they will be the solution for the dental pain.  Return to clinic for any new or urgent symptoms.  As Urgent Care providers, we only only can evaluate you for an episodic event; and this cannot be substituted for the continued care and monitoring by your primary care provider and/or specialist. It is not unusual that a medical condition can present itself in one way, then progress or change and lead to another impression of your medical condition. If you should have any new or worsening symptoms, please go to the closest emergency department and contact your primary physician as soon as possible for further evaluation and testing.   [facility] Emergency dental service in [place], [place]  Address: [address] [place], [place] [address] Phone: [phone]  [facility] [phone] extension [phone] [address]  Dr. Encarnacion [phone] [address]  [employer] [phone] [address] [facility].  [facility] mission [phone] extension [phone] [place], [place], [address] First come first serve for the first 10 clients.  May do simple extractions only, no wisdom teeth or surgery.  You may try the second for Thursday of the month starting at 6:30 AM.  [facility] You may call the school to see if they are still helping to provide dental care for emergent cases.

## 2023-12-04 NOTE — Telephone Encounter (Signed)
 Called Pharmacy, [facility] [address],  [place]) and cancelled [employer], per provider.  WENDI Dixon CMA II

## 2023-12-04 NOTE — Progress Notes (Signed)
 Virtual Visit Consent   [name], you are scheduled for a virtual visit with a [facility] provider today. Just as with appointments in the office, your consent must be obtained to participate. Your consent will be active for this visit and any virtual visit you may have with one of our providers in the next 365 days. If you have a MyChart account, a copy of this consent can be sent to you electronically.  As this is a virtual visit, video technology does not allow for your provider to perform a traditional examination. This may limit your provider's ability to fully assess your condition. If your provider identifies any concerns that need to be evaluated in person or the need to arrange testing (such as labs, EKG, etc.), we will make arrangements to do so. Although advances in technology are sophisticated, we cannot ensure that it will always work on either your end or our end. If the connection with a video visit is poor, the visit may have to be switched to a telephone visit. With either a video or telephone visit, we are not always able to ensure that we have a secure connection.  By engaging in this virtual visit, you consent to the provision of healthcare and authorize for your insurance to be billed (if applicable) for the services provided during this visit. Depending on your insurance coverage, you may receive a charge related to this service.  I need to obtain your verbal consent now. Are you willing to proceed with your visit today? [name] has provided verbal consent on 12/04/2023 for a virtual visit (video or telephone). Emily CHRISTELLA Belt, NP  Date: 12/04/2023 2:35 PM   Virtual Visit via Video Note   I, Emily Golden, connected with  Emily Golden  ([id number], 09/14/2000) on 12/04/23 at  2:30 PM EST by a video-enabled telemedicine application and verified that I am speaking with the correct person using two identifiers.  Location: Patient: Virtual Visit Location Patient:  Home Provider: Virtual Visit Location Provider: Home Office   I discussed the limitations of evaluation and management by telemedicine and the availability of in person appointments. The patient expressed understanding and agreed to proceed.    History of Present Illness: Emily Golden is a 23 y.o. who identifies as a female who was assigned female at birth, and is being seen today for dental pain.   R side has messed up teeth x2 (one upper, one lower) - both are failed root canals, one crumbled when she was pregnant last year, too.   Is currently pregnant, due in Feb. Was seen in [facility]U, given 2 days of hydrocodone  for pain. Was supposed to see dentist but got the flu and couldn't go. Now has dentist appt tomorrow but pain is severe, wants more hydrocodone . Has gum swelling around teeth in question, thinks she has an infection.   HPI: HPI  Problems:  Patient Active Problem List   Diagnosis Date Noted   Supervision of high risk pregnancy in third trimester 11/29/2023   Hx of preeclampsia, prior pregnancy, currently pregnant 11/29/2023   Pilonidal cyst 11/02/2023   Family history of congenital heart defect 08/16/2023   Maternal varicella, non-immune 11/08/2022   Hx Fetal tetralogy of Fallot affecting antepartum care of mother 10/07/2022   Symptomatic cholelithiasis 08/28/2022   Obesity in pregnancy 07/11/2022   Irritable bowel syndrome 05/26/2022   Grief 03/07/2022   Bipolar 2 disorder (HCC) 07/02/2021   GAD (generalized anxiety disorder) 07/02/2021   ADHD 07/02/2021  Tobacco use disorder 07/02/2021   Alcohol consumption binge drinking 07/02/2021   Suicide attempt by drug ingestion (HCC) 06/09/2018   PTSD (post-traumatic stress disorder)     Allergies:  Allergies  Allergen Reactions   Other Shortness Of Breath    Dust, Reports smells,perfumes,aerosols HX asthma    Latex Rash   Medications:  Current Outpatient Medications:    penicillin  v potassium (VEETID) 500 MG tablet,  Take 1 tablet (500 mg total) by mouth 3 (three) times daily for 10 days., Disp: 30 tablet, Rfl: 0   acetaminophen  (TYLENOL ) 500 MG tablet, Take 2 tablets (1,000 mg total) by mouth every 6 (six) hours as needed for mild pain., Disp: , Rfl:    ALBUTEROL  IN, Inhale 1-2 puffs into the lungs as needed., Disp: , Rfl:    amoxicillin -clavulanate (AUGMENTIN ) 875-125 MG tablet, Take 1 tablet by mouth every 12 (twelve) hours., Disp: 14 tablet, Rfl: 0   ARIPiprazole  (ABILIFY ) 5 MG tablet, Take 1 tablet (5 mg total) by mouth daily., Disp: 30 tablet, Rfl: 1   beclomethasone (QVAR ) 80 MCG/ACT inhaler, Inhale 2 puffs into the lungs daily as needed (for shortenss of breath)., Disp: , Rfl:    busPIRone  (BUSPAR ) 7.5 MG tablet, Take 1 tablet (7.5 mg total) by mouth 2 (two) times daily., Disp: 60 tablet, Rfl: 1   HYDROcodone -acetaminophen  (NORCO/VICODIN) 5-325 MG tablet, Take 2 tablets by mouth every 4 (four) hours as needed. (Patient not taking: Reported on 11/29/2023), Disp: 20 tablet, Rfl: 0   hydrOXYzine  (ATARAX ) 25 MG tablet, Take 2 tablets (50 mg total) by mouth 3 (three) times daily. (Patient not taking: Reported on 11/29/2023), Disp: 60 tablet, Rfl: 2   ondansetron  (ZOFRAN -ODT) 4 MG disintegrating tablet, Take 1 tablet (4 mg total) by mouth every 6 (six) hours as needed for nausea or vomiting. (Patient not taking: Reported on 11/29/2023), Disp: 10 tablet, Rfl: 0   Prenatal Vit-Fe Fumarate-FA (MULTIVITAMIN-PRENATAL) 27-0.8 MG TABS tablet, Take 1 tablet by mouth daily at 12 noon., Disp: , Rfl:    propranolol  (INDERAL ) 10 MG tablet, Take 1 tablet (10 mg total) by mouth at bedtime., Disp: 90 tablet, Rfl: 3  Observations/Objective: Patient is well-developed, well-nourished in no acute distress.  Resting comfortably  at home.  Head is normocephalic, atraumatic.  No labored breathing.  Speech is clear and coherent with logical content.  Patient is alert and oriented at baseline.    Assessment and Plan: 1. Dental  infection (Primary) - penicillin  v potassium (VEETID) 500 MG tablet; Take 1 tablet (500 mg total) by mouth 3 (three) times daily for 10 days.  Dispense: 30 tablet; Refill: 0  I cannot rx hydrocodone  over internet. She can take tylenol  or be seen in person for pain med. F/u dentist tomorrow as scheduled.   Follow Up Instructions: I discussed the assessment and treatment plan with the patient. The patient was provided an opportunity to ask questions and all were answered. The patient agreed with the plan and demonstrated an understanding of the instructions.  A copy of instructions were sent to the patient via MyChart unless otherwise noted below.   The patient was advised to call back or seek an in-person evaluation if the symptoms worsen or if the condition fails to improve as anticipated.    Emily CHRISTELLA Belt, NP

## 2023-12-04 NOTE — ED Triage Notes (Addendum)
 Pt reports R-sided upper and lower dental pain x1 month. These areas have been giving her trouble over the last year, but severity has really increased over the last month. She has an appt for evaluation by the dentist tomorrow. Maternal assessment unit (currently pregnant) saw her recently and prescribed pain medication(hydrocodone , 2 day supply), but she has since run out. Has been taken tylenol  and using orajel with mild relief. Pain is preventing her from sleeping - has been up since 0230.

## 2023-12-05 ENCOUNTER — Encounter: Payer: Self-pay | Admitting: Family Medicine

## 2023-12-05 NOTE — Telephone Encounter (Signed)
 Pharmacist calling to confirm previous Rx (sent to [facility] [address] was to be cancelled).  I have let him know per provider, Norco Rx was cancelled at this location by provider. He states patient did come to pick it up today at [facility] [address] after picking up the same Rx [facility] last night.   Redell BATTLE CMA II

## 2023-12-07 ENCOUNTER — Other Ambulatory Visit: Payer: Self-pay

## 2023-12-07 DIAGNOSIS — O0993 Supervision of high risk pregnancy, unspecified, third trimester: Secondary | ICD-10-CM

## 2023-12-08 ENCOUNTER — Other Ambulatory Visit: Payer: MEDICAID

## 2023-12-12 ENCOUNTER — Encounter: Payer: Self-pay | Admitting: Family Medicine

## 2023-12-13 NOTE — Progress Notes (Signed)
 PRENATAL VISIT NOTE  Subjective:  Emily Golden is a 23 y.o. H3E8958 at [redacted]w[redacted]d being seen today for ongoing prenatal care.  She is currently monitored for the following issues for this high-risk pregnancy and has PTSD (post-traumatic stress disorder); Suicide attempt by drug ingestion (HCC); Bipolar 2 disorder (HCC); GAD (generalized anxiety disorder); ADHD; Tobacco use disorder; Alcohol consumption binge drinking; Grief; Symptomatic cholelithiasis; Hx Fetal tetralogy of Fallot affecting antepartum care of mother; Irritable bowel syndrome; Maternal varicella, non-immune; Obesity in pregnancy; Family history of congenital heart defect; Pilonidal cyst; Supervision of high risk pregnancy in third trimester; and Hx of preeclampsia, prior pregnancy, currently pregnant on their problem list.  Patient reports insomnia and irregular bowel movements.  Contractions: Irritability. Vag. Bleeding: None.  Movement: Present. Denies leaking of fluid.   The following portions of the patient's history were reviewed and updated as appropriate: allergies, current medications, past family history, past medical history, past social history, past surgical history and problem list.   Objective:   Vitals:   12/14/23 0826 12/14/23 0904  BP: (!) 152/83 (!) 142/90  Pulse: (!) 108 100  Weight: (!) 322 lb 3.2 oz (146.1 kg)     Fetal Status:  Fetal Heart Rate (bpm): 135 Fundal Height: 33 cm Movement: Present    General: Alert, oriented and cooperative. Patient is in no acute distress.  Skin: Skin is warm and dry. No rash noted.   Cardiovascular: Normal heart rate noted  Respiratory: Normal respiratory effort, no problems with respiration noted  Abdomen: Soft, gravid, appropriate for gestational age.  Pain/Pressure: Present     Pelvic: Cervical exam deferred        Extremities: Normal range of motion.  Edema: Trace  Mental Status: Normal mood and affect. Normal behavior. Normal judgment and thought content.       11/29/2023   10:14 AM 09/27/2023   10:04 AM 10/08/2021   11:53 AM  Depression screen PHQ 2/9  Decreased Interest 1  2  Down, Depressed, Hopeless 1  2  PHQ - 2 Score 2  4  Altered sleeping 0  1  Tired, decreased energy 1  3  Change in appetite 0  1  Feeling bad or failure about yourself  0  0  Trouble concentrating 0  2  Moving slowly or fidgety/restless 0  0  Suicidal thoughts 0  0  PHQ-9 Score 3  11   Difficult doing work/chores        Information is confidential and restricted. Go to Review Flowsheets to unlock data.   Data saved with a previous flowsheet row definition        11/29/2023   10:14 AM 09/27/2023   10:06 AM 10/08/2021   11:54 AM 09/22/2021   12:29 PM  GAD 7 : Generalized Anxiety Score  Nervous, Anxious, on Edge 2  2 3   Control/stop worrying 2  2 3   Worry too much - different things 2  1 3   Trouble relaxing 2  2 2   Restless 1  2 3   Easily annoyed or irritable 2  2 2   Afraid - awful might happen 1  1 3   Total GAD 7 Score 12  12 19   Anxiety Difficulty         Information is confidential and restricted. Go to Review Flowsheets to unlock data.    Assessment and Plan:  Pregnancy: [id number] at [redacted]w[redacted]d 1. Supervision of high risk pregnancy in third trimester (Primary) - Doing well, feeling regular and vigorous fetal movement  2. [redacted] weeks gestation of pregnancy - Late entry to care, 3T labs today.  - Routine PNC, anticipatory guidance.  - FH elevated to 34cm, referral to MFM for growth US . Message sent to on-call provider for accomodation in schedule.   3. Hx of preeclampsia, prior pregnancy, currently pregnant - Elevated BP in office today x2. Advised to present to MAU after completion of 3T labs today. Message sent to MAU colleagues.   4. Family history of tetralogy of Fallot - Had fetal ECHO, WNL.   5. GAD (generalized anxiety disorder) - Followed by [facility]  6. Tachycardia - Feels the sensation of heat, feels heart racing and then vision starts to get dark  and has to sit down.  - Has been happening since mid second trimester.  - OB cardiology referral placed, instructed to present to MAU with persistent or worsening symptoms.  - Patient had gone out to lobby and was completing 3T labs including GTT. Patient not feeling well per RN staff, feeling heart racing, dizzy and like I'm going to black out. Taken to exam room via wheelchair by CNM. VS obtained with HR variable from 120s-135. Patient feeling nauseous but no emesis. RN staff instructed to call [facility] for transport to MAU. MAU providers informed that patient now coming via EMS for further evaluation. Grandmother Patty called as well to update her on status.  7. Musculoskeletal pain - Trial of 5mg  PO TID PRN flexeril  today.    Preterm labor symptoms and general obstetric precautions including but not limited to vaginal bleeding, contractions, leaking of fluid and fetal movement were reviewed in detail with the patient. Please refer to After Visit Summary for other counseling recommendations.   Return in about 2 weeks (around 12/28/2023) for HROB.  Future Appointments  Date Time Provider Department Center  12/14/2023 10:00 AM [facility]  01/02/2024  7:00 AM [facility] PROVIDER 1 [facility]  01/02/2024  7:30 AM [facility] US2 [facility]    Camie DELENA Rote, CNM

## 2023-12-14 ENCOUNTER — Encounter: Payer: MEDICAID | Admitting: Certified Nurse Midwife

## 2023-12-14 ENCOUNTER — Inpatient Hospital Stay (HOSPITAL_COMMUNITY): Payer: MEDICAID

## 2023-12-14 ENCOUNTER — Encounter (HOSPITAL_COMMUNITY): Payer: Self-pay | Admitting: Obstetrics and Gynecology

## 2023-12-14 ENCOUNTER — Other Ambulatory Visit: Payer: Self-pay

## 2023-12-14 ENCOUNTER — Other Ambulatory Visit: Payer: MEDICAID

## 2023-12-14 ENCOUNTER — Inpatient Hospital Stay (HOSPITAL_COMMUNITY)
Admission: AD | Admit: 2023-12-14 | Discharge: 2023-12-14 | Disposition: A | Discharge status: Home / Self Care | Payer: MEDICAID | Attending: Obstetrics and Gynecology | Admitting: Obstetrics and Gynecology

## 2023-12-14 ENCOUNTER — Ambulatory Visit: Payer: MEDICAID | Admitting: Certified Nurse Midwife

## 2023-12-14 VITALS — BP 149/106 | HR 123 | Wt 322.2 lb

## 2023-12-14 DIAGNOSIS — M7918 Myalgia, other site: Secondary | ICD-10-CM | POA: Diagnosis not present

## 2023-12-14 DIAGNOSIS — O0993 Supervision of high risk pregnancy, unspecified, third trimester: Secondary | ICD-10-CM

## 2023-12-14 DIAGNOSIS — Z8279 Family history of other congenital malformations, deformations and chromosomal abnormalities: Secondary | ICD-10-CM

## 2023-12-14 DIAGNOSIS — Z3A30 30 weeks gestation of pregnancy: Secondary | ICD-10-CM

## 2023-12-14 DIAGNOSIS — Z23 Encounter for immunization: Secondary | ICD-10-CM

## 2023-12-14 DIAGNOSIS — Z363 Encounter for antenatal screening for malformations: Secondary | ICD-10-CM

## 2023-12-14 DIAGNOSIS — F411 Generalized anxiety disorder: Secondary | ICD-10-CM | POA: Diagnosis not present

## 2023-12-14 DIAGNOSIS — O133 Gestational [pregnancy-induced] hypertension without significant proteinuria, third trimester: Secondary | ICD-10-CM

## 2023-12-14 DIAGNOSIS — O09299 Supervision of pregnancy with other poor reproductive or obstetric history, unspecified trimester: Secondary | ICD-10-CM

## 2023-12-14 DIAGNOSIS — R000 Tachycardia, unspecified: Secondary | ICD-10-CM | POA: Diagnosis not present

## 2023-12-14 DIAGNOSIS — E669 Obesity, unspecified: Secondary | ICD-10-CM

## 2023-12-14 DIAGNOSIS — O99213 Obesity complicating pregnancy, third trimester: Secondary | ICD-10-CM

## 2023-12-14 DIAGNOSIS — O99019 Anemia complicating pregnancy, unspecified trimester: Secondary | ICD-10-CM

## 2023-12-14 DIAGNOSIS — O0933 Supervision of pregnancy with insufficient antenatal care, third trimester: Secondary | ICD-10-CM

## 2023-12-14 DIAGNOSIS — O09293 Supervision of pregnancy with other poor reproductive or obstetric history, third trimester: Secondary | ICD-10-CM | POA: Diagnosis not present

## 2023-12-14 DIAGNOSIS — O3660X0 Maternal care for excessive fetal growth, unspecified trimester, not applicable or unspecified: Secondary | ICD-10-CM

## 2023-12-14 LAB — CBC
HCT: 31.2 % — ABNORMAL LOW (ref 36.0–46.0)
Hemoglobin: 10.3 g/dL — ABNORMAL LOW (ref 12.0–15.0)
MCH: 27.2 pg (ref 26.0–34.0)
MCHC: 33 g/dL (ref 30.0–36.0)
MCV: 82.3 fL (ref 80.0–100.0)
Platelets: 253 K/uL (ref 150–400)
RBC: 3.79 MIL/uL — ABNORMAL LOW (ref 3.87–5.11)
RDW: 14.3 % (ref 11.5–15.5)
WBC: 8.9 K/uL (ref 4.0–10.5)
nRBC: 0 % (ref 0.0–0.2)

## 2023-12-14 LAB — COMPREHENSIVE METABOLIC PANEL WITH GFR
ALT: 12 U/L (ref 0–44)
AST: 16 U/L (ref 15–41)
Albumin: 2.3 g/dL — ABNORMAL LOW (ref 3.5–5.0)
Alkaline Phosphatase: 114 U/L (ref 38–126)
Anion gap: 9 (ref 5–15)
BUN: <5 mg/dL — ABNORMAL LOW (ref 6–20)
CO2: 19 mmol/L — ABNORMAL LOW (ref 22–32)
Calcium: 8.2 mg/dL — ABNORMAL LOW (ref 8.9–10.3)
Chloride: 108 mmol/L (ref 98–111)
Creatinine, Ser: 0.47 mg/dL (ref 0.44–1.00)
GFR, Estimated: >60 mL/min (ref >60)
Glucose, Bld: 103 mg/dL — ABNORMAL HIGH (ref 70–99)
Potassium: 3.3 mmol/L — ABNORMAL LOW (ref 3.5–5.1)
Sodium: 136 mmol/L (ref 135–145)
Total Bilirubin: 0.6 mg/dL (ref 0.0–1.2)
Total Protein: 6.2 g/dL — ABNORMAL LOW (ref 6.5–8.1)

## 2023-12-14 LAB — PROTEIN / CREATININE RATIO, URINE
Creatinine, Urine: 357 mg/dL
Protein Creatinine Ratio: 0.08 mg/mg{creat} (ref 0.00–0.15)
Total Protein, Urine: 30 mg/dL

## 2023-12-14 MED ORDER — FERROUS SULFATE 325 (65 FE) MG PO TABS: ORAL_TABLET | ORAL | 11 refills | Status: DC

## 2023-12-14 MED ORDER — LORAZEPAM 2 MG PO TABS: 2.0000 mg | ORAL_TABLET | Freq: Four times a day (QID) | ORAL | 0 refills | Status: DC | PRN

## 2023-12-14 MED ORDER — NIFEDIPINE 10 MG PO CAPS: 20.0000 mg | ORAL_CAPSULE | ORAL | Status: DC | PRN

## 2023-12-14 MED ORDER — LABETALOL HCL 5 MG/ML IV SOLN: 40.0000 mg | INTRAVENOUS | Status: DC | PRN

## 2023-12-14 MED ORDER — POTASSIUM CHLORIDE CRYS ER 20 MEQ PO TBCR
40.0000 meq | EXTENDED_RELEASE_TABLET | Freq: Once | ORAL | Status: AC
  Administered 2023-12-14: 40 meq via ORAL
  Filled 2023-12-14: qty 2

## 2023-12-14 MED ORDER — CYCLOBENZAPRINE HCL 5 MG PO TABS: 5.0000 mg | ORAL_TABLET | Freq: Three times a day (TID) | ORAL | 0 refills | Status: DC | PRN

## 2023-12-14 MED ORDER — ONDANSETRON 4 MG PO TBDP: 4.0000 mg | ORAL_TABLET | Freq: Three times a day (TID) | ORAL | 2 refills | Status: AC | PRN

## 2023-12-14 MED ORDER — ONDANSETRON 4 MG PO TBDP
8.0000 mg | ORAL_TABLET | Freq: Once | ORAL | Status: AC
  Administered 2023-12-14: 8 mg via ORAL
  Filled 2023-12-14: qty 2

## 2023-12-14 MED ORDER — ACETAMINOPHEN-CAFFEINE 500-65 MG PO TABS
2.0000 | ORAL_TABLET | Freq: Once | ORAL | Status: AC
  Administered 2023-12-14: 2 via ORAL
  Filled 2023-12-14: qty 2

## 2023-12-14 MED ORDER — NIFEDIPINE 10 MG PO CAPS: 10.0000 mg | ORAL_CAPSULE | ORAL | Status: DC | PRN

## 2023-12-14 NOTE — MAU Note (Signed)
.  Emily Golden is a 23 y.o. at [redacted]w[redacted]d here in MAU reporting: HA, dizziness, elevated HR/BP, and vision changes this morning at her provider office when she had a high heart rate episode. Pt arrived via EMS. Pt states these episodes are normal for her and she sees spots each time they occur, and feels nauseated. Denies VB and LOF. Reports +FM and BH. Pt reports h/o SVT but only takes propranolol  when the episode occurs-she states it's been 3 weeks since last taking this medication.  LMP: na Onset of complaint: in office today Pain score: 6/10-HA Vitals:   12/14/23 1200 12/14/23 1210  BP: (!) 142/85   Pulse: (!) 123   Resp:    Temp:    SpO2: 100% 99%     FHT: 144  Lab orders placed from triage: na\

## 2023-12-14 NOTE — Patient Instructions (Addendum)
 Magnesium  in pregnancy  Magnesium  is a natural electrolyte we find in our foods and beverages. It is beneficial in pregnancy for multiple complaints.  It can help with headaches, rest, muscle aches/spasm, and bowel movements.  You may take Magnesium  Glycinate or Magnesium  Oxide (available at drug stores and online). We recommend starting with one tablet and working up to two. Take Magnesium  nightly at bedtime to promote rest.  120s-

## 2023-12-14 NOTE — Discharge Instructions (Addendum)
 BLOOD PRESSURE: Your blood pressure have been high today and meet criteria for gestational hypertension. It is not high enough that we need to start medication, but we will need to keep a close eye on your blood pressure and your baby. ULTRASOUND RESULTS: Your baby is also measuring on the larger side on the ultrasound today. We will need more frequent follow up ultrasound with Maternal Fetal Medicine to watch your baby's growth. NEXT STEPS: The office will call you to schedule three things: A blood pressure check in the office Friday morning A reschedule for your glucose test for gestational diabetes. Having a baby on the larger side increases our suspicion for gestational diabetes, so it is important that we do this test within the next week. Please take 1-2 tablets of Zofran  that morning about 1-2 hours before the test! A repeat ultrasound in 4-5 weeks. You will also meet with the MFM doctor that day to talk about next steps regarding delivery.  Reasons to return to [facility] at [facility]: Your blood pressure is >160/110. You have a headache that will not go away after having something to eat, something to drink, and Tylenol . You have changes in your vision or upper abdominal pain that does not get better with Tylenol . Less than 36 weeks: Contractions feels like menstrual cramps. You should go to the hospital if you have more than 6 contractions in an hour, even after you have rested and drank at least 16 ounces of water.  More than 36 weeks: You begin to have strong, frequent contractions 5 minutes apart or less, each last 1 minute, these have been going on for 1-2 hours, and you cannot walk or talk during them. Your water breaks.  Sometimes it is a big gush of fluid. However, many times it may it may be much more subtle. You should go to the hospital if you have a constant leakage of fluid from your vagina, enough to soak a pad when you are walking around.  You have  vaginal bleeding.  It is normal to have a small amount of spotting if your cervix was checked. If you have bleeding requiring the use of a pad, go to the hospital. You don't feel your baby moving like normal.  If you think that you babys movement is decreased, eat a snack and rest on your left side in a quiet room for one hour. If you have not felt the baby move more than 6 times in an hour GO TO THE HOSPITAL.

## 2023-12-14 NOTE — MAU Provider Note (Addendum)
 Chief Complaint:  Hypertension and Headache   HPI   None     Evalette Hudon is a 23 y.o. [id number] at [redacted]w[redacted]d who presents to maternity admissions for elevated blood pressures. She had elevated blood pressures 152/83, 142/90, and 149/106 in the office today. She has a history of preeclampsia with severe features in her prior pregnancy in 2024. Currently endorses headache, nausea. Denies epigastric pain, vision changes, edema. She was experiencing palpitations after her GTT today and has had them intermittently the past few weeks, cardiology referral was placed.  Additional history obtained from mother  Pregnancy Course: Receives care at [facility] . Prenatal records reviewed. Uterine size-date discrepancy noted by CNM in office today as well, measuring 34 cm fundal height. Next prenatal appointment scheduled for 12/30.  Past Medical History:  Diagnosis Date   Allergy    Anxiety    Asthma    Bipolar 2 disorder (HCC)    Gallstones    PTSD (post-traumatic stress disorder)    SVT (supraventricular tachycardia)    OB History  Gravida Para Term Preterm AB Living  6 1 1  0 4 1  SAB IAB Ectopic Multiple Live Births  4 0 0  1    # Outcome Date GA Lbr Len/2nd Weight Sex Type Anes PTL Lv  6 Current           5 Term 12/20/22 [redacted]w[redacted]d 01:11 / 00:25 3340 g M Vag-Spont EPI  LIV  4 SAB 10/04/21 [redacted]w[redacted]d   U         Birth Comments: SAB managed with cytotec   3 SAB 2022          2 SAB         ND  1 SAB            Past Surgical History:  Procedure Laterality Date   CHOLECYSTECTOMY     compound fracture right arm Right    NASAL RECONSTRUCTION     Family History  Problem Relation Age of Onset   Diabetes Maternal Grandmother    Hypertension Maternal Grandmother    Hyperlipidemia Maternal Grandmother    Heart disease Maternal Grandfather    Cancer Maternal Grandfather    Diabetes Maternal Grandfather    Heart failure Neg Hx    Social  History[1] Allergies[2] Medications Prior to Admission  Medication Sig Dispense Refill Last Dose/Taking   acetaminophen  (TYLENOL ) 500 MG tablet Take 2 tablets (1,000 mg total) by mouth every 6 (six) hours as needed for mild pain.   Past Month   ALBUTEROL  IN Inhale 1-2 puffs into the lungs as needed.   Past Month   ARIPiprazole  (ABILIFY ) 5 MG tablet Take 1 tablet (5 mg total) by mouth daily. 30 tablet 1 Past Week   busPIRone  (BUSPAR ) 7.5 MG tablet Take 1 tablet (7.5 mg total) by mouth 2 (two) times daily. 60 tablet 1 Past Week   Prenatal Vit-Fe Fumarate-FA (MULTIVITAMIN-PRENATAL) 27-0.8 MG TABS tablet Take 1 tablet by mouth daily at 12 noon.   Past Week   propranolol  (INDERAL ) 10 MG tablet Take 1 tablet (10 mg total) by mouth at bedtime. 90 tablet 3 Past Month   beclomethasone (QVAR ) 80 MCG/ACT inhaler Inhale 2 puffs into the lungs daily as needed (for shortenss of breath). (Patient not taking: Reported on 12/04/2023)      cyclobenzaprine  (FLEXERIL ) 5 MG tablet Take 1 tablet (5 mg total) by mouth 3 (three) times daily as needed for muscle spasms. 20 tablet  0    HYDROcodone -acetaminophen  (NORCO/VICODIN) 5-325 MG tablet Take 2 tablets by mouth every 4 (four) hours as needed. 10 tablet 0 More than a month   hydrOXYzine  (ATARAX ) 25 MG tablet Take 2 tablets (50 mg total) by mouth 3 (three) times daily. (Patient not taking: Reported on 11/29/2023) 60 tablet 2    LORazepam (ATIVAN) 2 MG tablet Take 1 tablet (2 mg total) by mouth every 6 (six) hours as needed for anxiety. 1 tablet 0    naloxone (NARCAN) nasal spray 4 mg/0.1 mL Place 1 spray into the nose once. (Patient not taking: Reported on 12/14/2023)      [DISCONTINUED] ondansetron  (ZOFRAN -ODT) 4 MG disintegrating tablet Take 1 tablet (4 mg total) by mouth every 6 (six) hours as needed for nausea or vomiting. (Patient not taking: Reported on 11/29/2023) 10 tablet 0     I have reviewed patient's Past Medical Hx, Surgical Hx, Family Hx, Social Hx,  medications and allergies.   ROS  Pertinent items noted in HPI and remainder of comprehensive ROS otherwise negative.   PHYSICAL EXAM  Patient Vitals for the past 24 hrs:  BP Temp Temp src Pulse Resp SpO2 Height Weight  12/14/23 1430 133/78 -- -- 83 -- -- -- --  12/14/23 1415 136/74 -- -- 88 -- -- -- --  12/14/23 1400 121/70 -- -- 95 -- -- -- --  12/14/23 1345 117/71 -- -- 93 -- -- -- --  12/14/23 1335 139/69 -- -- 92 -- -- -- --  12/14/23 1245 (!) 147/85 -- -- (!) 105 -- 99 % -- --  12/14/23 1240 -- -- -- -- -- 99 % -- --  12/14/23 1235 -- -- -- -- -- 99 % -- --  12/14/23 1230 (!) 143/85 -- -- (!) 123 -- 100 % -- --  12/14/23 1225 -- -- -- -- -- 99 % -- --  12/14/23 1220 -- -- -- -- -- 99 % -- --  12/14/23 1215 (!) 138/106 -- -- (!) 110 -- 99 % -- --  12/14/23 1210 -- -- -- -- -- 99 % -- --  12/14/23 1205 -- -- -- -- -- 99 % -- --  12/14/23 1200 (!) 142/85 -- -- (!) 123 -- 100 % -- --  12/14/23 1157 -- 98 F (36.7 C) Oral -- 17 98 % 5' 7 (1.702 m) (!) 146.1 kg    Constitutional: Well-developed, well-nourished female in no acute distress.  HEENT: atraumatic, normocephalic. Neck has normal ROM. EOM intact. Cardiovascular: normal rate & rhythm, warm and well-perfused Respiratory: normal effort, no problems with respiration noted GI: Abd soft, non-tender, non-distended MSK: Extremities nontender, no edema, normal ROM Skin: warm and dry. Acyanotic, no jaundice or pallor. Neurologic: Alert and oriented x 4. No abnormal coordination. Psychiatric: Normal mood. Speech not slurred, not rapid/pressured. Patient is cooperative.  Fetal Tracing: Baseline FHR: 130 per minute Fetal heart variability: moderate Fetal Heart Rate accelerations: yes Fetal Heart Rate decelerations: none Fetal Non-stress Test: Category I (reactive) Toco: no uterine contractions   Labs: Results for orders placed or performed during the hospital encounter of 12/14/23 (from the past 24 hours)  CBC     Status:  Abnormal   Collection Time: 12/14/23 12:26 PM  Result Value Ref Range   WBC 8.9 4.0 - 10.5 K/uL   RBC 3.79 (L) 3.87 - 5.11 MIL/uL   Hemoglobin 10.3 (L) 12.0 - 15.0 g/dL   HCT 68.7 (L) 63.9 - 53.9 %   MCV 82.3 80.0 - 100.0 fL  MCH 27.2 26.0 - 34.0 pg   MCHC 33.0 30.0 - 36.0 g/dL   RDW 85.6 88.4 - 84.4 %   Platelets 253 150 - 400 K/uL   nRBC 0.0 0.0 - 0.2 %  Comprehensive metabolic panel with GFR     Status: Abnormal   Collection Time: 12/14/23 12:26 PM  Result Value Ref Range   Sodium 136 135 - 145 mmol/L   Potassium 3.3 (L) 3.5 - 5.1 mmol/L   Chloride 108 98 - 111 mmol/L   CO2 19 (L) 22 - 32 mmol/L   Glucose, Bld 103 (H) 70 - 99 mg/dL   BUN <5 (L) 6 - 20 mg/dL   Creatinine, Ser 9.52 0.44 - 1.00 mg/dL   Calcium 8.2 (L) 8.9 - 10.3 mg/dL   Total Protein 6.2 (L) 6.5 - 8.1 g/dL   Albumin 2.3 (L) 3.5 - 5.0 g/dL   AST 16 15 - 41 U/L   ALT 12 0 - 44 U/L   Alkaline Phosphatase 114 38 - 126 U/L   Total Bilirubin 0.6 0.0 - 1.2 mg/dL   GFR, Estimated >39 >39 mL/min   Anion gap 9 5 - 15  Protein / creatinine ratio, urine     Status: None   Collection Time: 12/14/23 12:43 PM  Result Value Ref Range   Creatinine, Urine 357 mg/dL   Total Protein, Urine 30 mg/dL   Protein Creatinine Ratio 0.08 0.00 - 0.15 mg/mg[Cre]    Imaging:  US  MFM FETAL BPP WO NON STRESS Result Date: 12/14/2023 ----------------------------------------------------------------------  OBSTETRICS REPORT                       (Signed Final 12/14/2023 02:19 pm) ---------------------------------------------------------------------- Patient Info  ID #:       [id number]                          D.O.B.:  Mar 29, 2000 (23 yrs)(F)  Name:       Emily Golden                     Visit Date: 12/14/2023 11:59 am ---------------------------------------------------------------------- Performed By  Attending:        Steffan Keys MD         Ref. Address:     [address]                                                             [place],  [place]                                                             [address]  Performed By:     Jonette Nap        [facility]  Referred By:      [facility] ---------------------------------------------------------------------- Orders  #  Description                           Code        Ordered By  1  US  MFM FETAL BPP WO NON               76819.01    Paralee Pendergrass     STRESS  2  US  MFM OB COMP + 14 WK                76805.01    Makinzee Durley ----------------------------------------------------------------------  #  Order #                     Accession #                Episode #  1  [id number]                   [id number]                 [id number]  2  [id number]                   [id number]                 [id number] ---------------------------------------------------------------------- Indications  Obesity complicating pregnancy, third          O99.213  trimester (BMI 49)  Poor obstetric history: Previous               O09.299  preeclampsia / eclampsia/gestational HTN  Late to prenatal care, third trimester         O09.33  Encounter for antenatal screening for          Z36.3  malformations  [redacted] weeks gestation of pregnancy                Z3A.30 ---------------------------------------------------------------------- Fetal Evaluation  Num Of Fetuses:         1  Fetal Heart Rate(bpm):  164  Cardiac Activity:       Observed  Presentation:           Cephalic  Placenta:               Anterior  P. Cord Insertion:      Previously seen  Amniotic Fluid  AFI FV:      Within normal limits  AFI Sum(cm)     %Tile       Largest Pocket(cm)  15.1            53          4.9  RUQ(cm)       RLQ(cm)       LUQ(cm)        LLQ(cm)  4.9           4.7           2.8            2.7 ---------------------------------------------------------------------- Biophysical Evaluation  Amniotic F.V:   Pocket => 2 cm             F.  Tone:        Observed  F. Movement:    Observed  Score:          8/8  F. Breathing:   Observed ---------------------------------------------------------------------- Biometry  BPD:      77.4  mm     G. Age:  31w 0d         58  %    CI:        76.58   %    70 - 86                                                          FL/HC:      20.0   %    19.2 - 21.4  HC:      280.2  mm     G. Age:  30w 5d         21  %    HC/AC:      0.95        0.99 - 1.21  AC:       296   mm     G. Age:  33w 4d       > 99  %    FL/BPD:     72.2   %    71 - 87  FL:       55.9  mm     G. Age:  29w 3d         13  %    FL/AC:      18.9   %    20 - 24  HUM:      54.1  mm     G. Age:  31w 3d         72  %  CER:      36.9  mm     G. Age:  30w 4d         34  %  LV:        9.7  mm  Est. FW:    1852  gm      4 lb 1 oz     85  % ---------------------------------------------------------------------- OB History  Gravidity:    6         Term:   1        Prem:   0        SAB:   4  TOP:          0       Ectopic:  0        Living: 1 ---------------------------------------------------------------------- Gestational Age  LMP:           30w 3d        Date:  05/15/23                  EDD:   02/19/24  Clinical EDD:  29w 5d                                        EDD:   02/24/24  U/S Today:     31w 1d  EDD:   02/14/24  Best:          minnette 3d     Det. By:  LMP  (05/15/23)          EDD:   02/19/24 ---------------------------------------------------------------------- Targeted Anatomy  Central Nervous System  Calvarium/Cranial V.:  Appears normal         Cereb./Vermis:          Appears normal  Cavum:                 Appears normal         Cisterna Magna:         Not well visualized  Lateral Ventricles:    Appears normal         Midline Falx:           Not well visualized  Choroid Plexus:        Not well visualized  Spine  Cervical:              Not well visualized    Sacral:                 Not well visualized  Thoracic:               Not well visualized    Shape/Curvature:        Appears normal  Lumbar:                Not well visualized  Head/Neck  Lips:                  Not well visualized    Profile:                Not well visualized  Neck:                  Appears normal         Orbits/Eyes:            Not well visualized  Nuchal Fold:           Not applicable         Mandible:               Not well visualized  Nasal Bone:            Not well visualized    Maxilla:                Not well visualized  Thorax  4 Chamber View:        Not well visualized    Interventr. Septum:     Not well visualized  Cardiac Rhythm:        Normal                 Cardiac Axis:           Normal  Cardiac Situs:         Appears normal         Diaphragm:              Appears normal  Rt Outflow Tract:      Not well visualized    3 Vessel View:          Not well visualized  Lt Outflow Tract:      Not well visualized    3 V Trachea View:       Not well visualized  Aortic Arch:           Not  well visualized    IVC:                    Appears normal  Ductal Arch:           Not well visualized    Crossing:               Not well visualized  SVC:                   Appears normal  Abdomen  Ventral Wall:          Appears normal         Lt Kidney:              Appears normal  Cord Insertion:        Appears normal         Rt Kidney:              Appears normal  Situs:                 Appears normal         Bladder:                Appears normal  Stomach:               Appears normal  Extremities  Lt Humerus:            Appears normal         Lt Femur:               Appears normal  Rt Humerus:            Not well visualized    Rt Femur:               Appears normal  Lt Forearm:            Appears normal         Lt Lower Leg:           Appears normal  Rt Forearm:            Not well visualized    Rt Lower Leg:           Appears normal  Lt Hand:               Open hand nml          Lt Foot:                Foot visualized  Rt Hand:               Not well visualized     Rt Foot:                Foot visualized  Other  Umbilical Cord:        Normal 3-vessel        Genitalia:              Female-nml  Comment:     Technically difficult due to maternal habitus, gestational age and               fetal position. ---------------------------------------------------------------------- Cervix Uterus Adnexa  Cervix  Not visualized (advanced GA >24wks)  Uterus  No abnormality visualized.  Right Ovary  Not visualized.  Left Ovary  Not visualized.  Cul De Sac  No free fluid seen.  Adnexa  No abnormality visualized ---------------------------------------------------------------------- Comments  This patient was sent to the MAU for a preeclampsia  workup  as she had elevated blood pressures and symptoms during  her office visit this morning.  Her pregnancy has been  complicated by maternal obesity.  She has a previous child who was born with tetralogy of  Fallot.  She had a fetal echocardiogram performed earlier in  her current pregnancy at [facility] that did not show any  fetal structural cardiac defects.  Sonographic findings  Single intrauterine pregnancy at 30w 3d.  Fetal cardiac activity:  Observed and appears normal.  Presentation: Cephalic.  The views of the fetal anatomy were limited today due to her  advanced gestational age and maternal body habitus.  Fetal biometry shows the estimated fetal weight of 4 lb 1 oz,  1852 grams (85%).  Amniotic fluid: Within normal limits.  MVP: 4.9 cm.  AFI 15.1  cm.  Placenta: Anterior.  BPP: 8/8.  There are limitations of prenatal ultrasound such as the  inability to detect certain abnormalities due to poor  visualization. Various factors such as fetal position,  gestational age and maternal body habitus may increase the  difficulty in visualizing the fetal anatomy.  Due to maternal obesity with a BMI of 50, she should start  weekly fetal testing at 34 weeks.  The weekly fetal testing  should be continued until delivery.  She should have another  growth ultrasound scheduled in the  MFM office in 4 to 5 weeks. ----------------------------------------------------------------------                   Steffan Keys, MD Electronically Signed Final Report   12/14/2023 02:19 pm ----------------------------------------------------------------------   US  MFM OB COMP + 14 WK Result Date: 12/14/2023 ----------------------------------------------------------------------  OBSTETRICS REPORT                       (Signed Final 12/14/2023 02:19 pm) ---------------------------------------------------------------------- Patient Info  ID #:       [id number]                          D.O.B.:  2000/04/22 (23 yrs)(F)  Name:       Kimbley Welchel                     Visit Date: 12/14/2023 11:59 am ---------------------------------------------------------------------- Performed By  Attending:        Steffan Keys MD         Ref. Address:     [address]                                                             [place], [place]                                                             [address]  Performed By:     Jonette Nap        [facility]  Referred By:      [facility] ---------------------------------------------------------------------- Orders  #  Description                           Code        Ordered By  1  US  MFM FETAL BPP WO NON               76819.01    Rosalea Withrow     STRESS  2  US  MFM OB COMP + 14 WK                76805.01    Fantasy Donald ----------------------------------------------------------------------  #  Order #                     Accession #                Episode #  1  [id number]                   [id number]                 [id number]  2  [id number]                   [id number]                 [id number] ---------------------------------------------------------------------- Indications  Obesity complicating pregnancy, third           O99.213  trimester (BMI 49)  Poor obstetric history: Previous               O09.299  preeclampsia / eclampsia/gestational HTN  Late to prenatal care, third trimester         O09.33  Encounter for antenatal screening for          Z36.3  malformations  [redacted] weeks gestation of pregnancy                Z3A.30 ---------------------------------------------------------------------- Fetal Evaluation  Num Of Fetuses:         1  Fetal Heart Rate(bpm):  164  Cardiac Activity:       Observed  Presentation:           Cephalic  Placenta:               Anterior  P. Cord Insertion:      Previously seen  Amniotic Fluid  AFI FV:      Within normal limits  AFI Sum(cm)     %Tile       Largest Pocket(cm)  15.1            53          4.9  RUQ(cm)       RLQ(cm)       LUQ(cm)        LLQ(cm)  4.9           4.7           2.8            2.7 ---------------------------------------------------------------------- Biophysical Evaluation  Amniotic F.V:   Pocket => 2 cm             F. Tone:        Observed  F. Movement:    Observed  Score:          8/8  F. Breathing:   Observed ---------------------------------------------------------------------- Biometry  BPD:      77.4  mm     G. Age:  31w 0d         58  %    CI:        76.58   %    70 - 86                                                          FL/HC:      20.0   %    19.2 - 21.4  HC:      280.2  mm     G. Age:  30w 5d         21  %    HC/AC:      0.95        0.99 - 1.21  AC:       296   mm     G. Age:  33w 4d       > 99  %    FL/BPD:     72.2   %    71 - 87  FL:       55.9  mm     G. Age:  29w 3d         13  %    FL/AC:      18.9   %    20 - 24  HUM:      54.1  mm     G. Age:  31w 3d         72  %  CER:      36.9  mm     G. Age:  30w 4d         34  %  LV:        9.7  mm  Est. FW:    1852  gm      4 lb 1 oz     85  % ---------------------------------------------------------------------- OB History  Gravidity:    6         Term:   1        Prem:   0        SAB:   4  TOP:           0       Ectopic:  0        Living: 1 ---------------------------------------------------------------------- Gestational Age  LMP:           30w 3d        Date:  05/15/23                  EDD:   02/19/24  Clinical EDD:  29w 5d                                        EDD:   02/24/24  U/S Today:     31w 1d  EDD:   02/14/24  Best:          minnette 3d     Det. By:  LMP  (05/15/23)          EDD:   02/19/24 ---------------------------------------------------------------------- Targeted Anatomy  Central Nervous System  Calvarium/Cranial V.:  Appears normal         Cereb./Vermis:          Appears normal  Cavum:                 Appears normal         Cisterna Magna:         Not well visualized  Lateral Ventricles:    Appears normal         Midline Falx:           Not well visualized  Choroid Plexus:        Not well visualized  Spine  Cervical:              Not well visualized    Sacral:                 Not well visualized  Thoracic:              Not well visualized    Shape/Curvature:        Appears normal  Lumbar:                Not well visualized  Head/Neck  Lips:                  Not well visualized    Profile:                Not well visualized  Neck:                  Appears normal         Orbits/Eyes:            Not well visualized  Nuchal Fold:           Not applicable         Mandible:               Not well visualized  Nasal Bone:            Not well visualized    Maxilla:                Not well visualized  Thorax  4 Chamber View:        Not well visualized    Interventr. Septum:     Not well visualized  Cardiac Rhythm:        Normal                 Cardiac Axis:           Normal  Cardiac Situs:         Appears normal         Diaphragm:              Appears normal  Rt Outflow Tract:      Not well visualized    3 Vessel View:          Not well visualized  Lt Outflow Tract:      Not well visualized    3 V Trachea View:       Not well visualized  Aortic Arch:           Not well  visualized     IVC:                    Appears normal  Ductal Arch:           Not well visualized    Crossing:               Not well visualized  SVC:                   Appears normal  Abdomen  Ventral Wall:          Appears normal         Lt Kidney:              Appears normal  Cord Insertion:        Appears normal         Rt Kidney:              Appears normal  Situs:                 Appears normal         Bladder:                Appears normal  Stomach:               Appears normal  Extremities  Lt Humerus:            Appears normal         Lt Femur:               Appears normal  Rt Humerus:            Not well visualized    Rt Femur:               Appears normal  Lt Forearm:            Appears normal         Lt Lower Leg:           Appears normal  Rt Forearm:            Not well visualized    Rt Lower Leg:           Appears normal  Lt Hand:               Open hand nml          Lt Foot:                Foot visualized  Rt Hand:               Not well visualized    Rt Foot:                Foot visualized  Other  Umbilical Cord:        Normal 3-vessel        Genitalia:              Female-nml  Comment:     Technically difficult due to maternal habitus, gestational age and               fetal position. ---------------------------------------------------------------------- Cervix Uterus Adnexa  Cervix  Not visualized (advanced GA >24wks)  Uterus  No abnormality visualized.  Right Ovary  Not visualized.  Left Ovary  Not visualized.  Cul De Sac  No free fluid seen.  Adnexa  No abnormality visualized ---------------------------------------------------------------------- Comments  This patient was sent to the MAU for a preeclampsia workup  as she had elevated blood pressures and symptoms during  her office visit this morning.  Her pregnancy has been  complicated by maternal obesity.  She has a previous child who was born with tetralogy of  Fallot.  She had a fetal echocardiogram performed earlier in  her current pregnancy at [facility] that did not show any  fetal structural cardiac defects.  Sonographic findings  Single intrauterine pregnancy at 30w 3d.  Fetal cardiac activity:  Observed and appears normal.  Presentation: Cephalic.  The views of the fetal anatomy were limited today due to her  advanced gestational age and maternal body habitus.  Fetal biometry shows the estimated fetal weight of 4 lb 1 oz,  1852 grams (85%).  Amniotic fluid: Within normal limits.  MVP: 4.9 cm.  AFI 15.1  cm.  Placenta: Anterior.  BPP: 8/8.  There are limitations of prenatal ultrasound such as the  inability to detect certain abnormalities due to poor  visualization. Various factors such as fetal position,  gestational age and maternal body habitus may increase the  difficulty in visualizing the fetal anatomy.  Due to maternal obesity with a BMI of 50, she should start  weekly fetal testing at 34 weeks.  The weekly fetal testing  should be continued until delivery.  She should have another growth ultrasound scheduled in the  MFM office in 4 to 5 weeks. ----------------------------------------------------------------------                   Steffan Keys, MD Electronically Signed Final Report   12/14/2023 02:19 pm ----------------------------------------------------------------------    MDM & MAU COURSE  MDM: High  MAU Course: -Elevated BP. Heart rate initially elevated but normalized in MAU. -Excedrin Tension and Zofran  for headache and nausea. -CMP, CBC, urine protein/creatinine ratio to rule out preeclampsia. -Per MFM, ordering growth US  and BPP due to uterine size-date discrepancy. -CBC within normal limits other than anemia. Start PO iron supplement. -Urine protein/creatinine ratio within normal limits. -CMP with mild hypokalemia, will give 40 mEq potassium PO. Corrected calcium within normal limits, but will encourage increased protein intake. -US  shows EFW 85% and AC 99%. AFI within normal limits. BPP 8/8 and NST reactive. Reviewed with  Dr. Cleatus as well, reschedule GTT as soon as possible to screen for gestational diabetes given EFW and AC measurements. Per MFM, start weekly fetal testing at 34 weeks until delivery. Repeat growth ultrasound scheduled in the MFM office in 4 to 5 weeks. -BP normalized after returning from US . Previous elevated blood pressures in early pregnancy 6/26 and again on 11/18. Reviewed with Dr. Cleatus, concern for chronic HTN vs gestational HTN. Schedule BP check in office on 12/13.  Differential diagnosis considered for elevated blood pressure includes but is not limited to: high risk conditions like preeclampsia or gestational hypertension; chronic hypertension; transient spurious elevated blood pressure   Orders Placed This Encounter  Procedures   US  MFM FETAL BPP WO NON STRESS   US  MFM OB COMP + 14 WK   CBC   Comprehensive metabolic panel with GFR   Protein / creatinine ratio, urine   Vital signs   Notify physician (specify) Confirmatory reading of BP> 160/110 15 minutes later   Fetal monitoring   Continuous tocometry   Apply Hypertensive Disorders of Pregnancy Care Plan   Strict intake and output   Measure blood pressure   Discharge patient   Meds ordered this encounter  Medications   AND Linked Order Group    NIFEdipine (  PROCARDIA) capsule 10 mg    NIFEdipine (PROCARDIA) capsule 20 mg    NIFEdipine (PROCARDIA) capsule 20 mg    labetalol (NORMODYNE) injection 40 mg   acetaminophen -caffeine  (EXCEDRIN TENSION HEADACHE) 500-65 MG per tablet 2 tablet   ondansetron  (ZOFRAN -ODT) disintegrating tablet 8 mg   ferrous sulfate 325 (65 FE) MG tablet    Sig: Take 1 tablet (325 mg total) by mouth with breakfast on Monday, Wednesday, and Friday.    Dispense:  30 tablet    Refill:  11   potassium chloride  SA (KLOR-CON  M) CR tablet 40 mEq   ondansetron  (ZOFRAN -ODT) 4 MG disintegrating tablet    Sig: Take 1-2 tablets (4-8 mg total) by mouth every 8 (eight) hours as needed for nausea or vomiting.     Dispense:  20 tablet    Refill:  2    ASSESSMENT   1. Gestational hypertension, third trimester   2. Hx of preeclampsia, prior pregnancy, currently pregnant   3. Anemia during pregnancy   4. Excessive fetal growth affecting pregnancy, antepartum, single or unspecified fetus   5. [redacted] weeks gestation of pregnancy     PLAN  Discharge home in stable condition with preeclampsia and preterm labor precautions.  Gestational HTN vs chronic HTN. BP normalized in MAU. Schedule BP check in office on 12/13. Start PO iron supplement for anemia.  Repeat growth US  in 4-5 weeks. EFW 85% and AC 99% today.  Rescheduling GTT to screen for gestational diabetes as soon as possible.  Follow-up Information     [facility] Follow up in 2 day(s).   Specialty: Obstetrics and Gynecology Why: blood pressure check, repeat glucose test Contact information: [address] [place]  [id number] [phone]               Allergies as of 12/14/2023       Reactions   Other Shortness Of Breath   Dust, Reports smells,perfumes,aerosols HX asthma   Latex Rash        Medication List     TAKE these medications    acetaminophen  500 MG tablet Commonly known as: TYLENOL  Take 2 tablets (1,000 mg total) by mouth every 6 (six) hours as needed for mild pain.   ALBUTEROL  IN Inhale 1-2 puffs into the lungs as needed.   ARIPiprazole  5 MG tablet Commonly known as: Abilify  Take 1 tablet (5 mg total) by mouth daily.   beclomethasone 80 MCG/ACT inhaler Commonly known as: QVAR  Inhale 2 puffs into the lungs daily as needed (for shortenss of breath).   busPIRone  7.5 MG tablet Commonly known as: BUSPAR  Take 1 tablet (7.5 mg total) by mouth 2 (two) times daily.   cyclobenzaprine  5 MG tablet Commonly known as: FLEXERIL  Take 1 tablet (5 mg total) by mouth 3 (three) times daily as needed for muscle spasms.   ferrous sulfate 325 (65 FE)  MG tablet Take 1 tablet (325 mg total) by mouth with breakfast on Monday, Wednesday, and Friday.   HYDROcodone -acetaminophen  5-325 MG tablet Commonly known as: NORCO/VICODIN Take 2 tablets by mouth every 4 (four) hours as needed.   hydrOXYzine  25 MG tablet Commonly known as: ATARAX  Take 2 tablets (50 mg total) by mouth 3 (three) times daily.   LORazepam 2 MG tablet Commonly known as: Ativan Take 1 tablet (2 mg total) by mouth every 6 (six) hours as needed for anxiety.   multivitamin-prenatal 27-0.8 MG Tabs tablet Take 1 tablet by mouth daily at 12  noon.   naloxone 4 MG/0.1ML Liqd nasal spray kit Commonly known as: NARCAN Place 1 spray into the nose once.   ondansetron  4 MG disintegrating tablet Commonly known as: ZOFRAN -ODT Take 1-2 tablets (4-8 mg total) by mouth every 8 (eight) hours as needed for nausea or vomiting. What changed:  how much to take when to take this   propranolol  10 MG tablet Commonly known as: INDERAL  Take 1 tablet (10 mg total) by mouth at bedtime.         Joesph DELENA Sear, PA      [1]  Social History Tobacco Use   Smoking status: Never    Passive exposure: Never   Smokeless tobacco: Never   Tobacco comments:    Stopped smoking 3 months before she got pregnant  Vaping Use   Vaping status: Former   Substances: Nicotine   Substance Use Topics   Alcohol use: Not Currently   Drug use: Not Currently  [2]  Allergies Allergen Reactions   Other Shortness Of Breath    Dust, Reports smells,perfumes,aerosols HX asthma    Latex Rash

## 2023-12-15 ENCOUNTER — Ambulatory Visit: Payer: MEDICAID | Admitting: *Deleted

## 2023-12-15 VITALS — BP 114/77 | HR 70 | Wt 320.8 lb

## 2023-12-15 DIAGNOSIS — O0993 Supervision of high risk pregnancy, unspecified, third trimester: Secondary | ICD-10-CM

## 2023-12-15 DIAGNOSIS — O35BXX0 Maternal care for other (suspected) fetal abnormality and damage, fetal cardiac anomalies, not applicable or unspecified: Secondary | ICD-10-CM

## 2023-12-15 DIAGNOSIS — Z3A30 30 weeks gestation of pregnancy: Secondary | ICD-10-CM

## 2023-12-15 LAB — HIV ANTIBODY (ROUTINE TESTING W REFLEX): HIV Screen 4th Generation wRfx: NONREACTIVE

## 2023-12-15 LAB — CBC
Hematocrit: 33.3 % — ABNORMAL LOW (ref 34.0–46.6)
Hemoglobin: 10.7 g/dL — ABNORMAL LOW (ref 11.1–15.9)
MCH: 26.9 pg (ref 26.6–33.0)
MCHC: 32.1 g/dL (ref 31.5–35.7)
MCV: 84 fL (ref 79–97)
Platelets: 266 x10E3/uL (ref 150–450)
RBC: 3.98 x10E6/uL (ref 3.77–5.28)
RDW: 14.4 % (ref 11.7–15.4)
WBC: 9.1 x10E3/uL (ref 3.4–10.8)

## 2023-12-15 LAB — SYPHILIS: RPR W/REFLEX TO RPR TITER AND TREPONEMAL ANTIBODIES, TRADITIONAL SCREENING AND DIAGNOSIS ALGORITHM: RPR Ser Ql: NONREACTIVE

## 2023-12-15 NOTE — Progress Notes (Signed)
 Here for BP check . Denies headaches, or dizziness or visual disturbances. Trace edema. BP 103/70, repeated 114/77. States hx low bp before pregnancy. States took muscle relaxant last night . Reviewed symptoms to report to hospital for evaluation. Reviewed next ob appointment.  Rock Skip PEAK

## 2023-12-18 ENCOUNTER — Other Ambulatory Visit: Payer: Self-pay

## 2023-12-18 ENCOUNTER — Telehealth: Payer: Self-pay | Admitting: *Deleted

## 2023-12-18 DIAGNOSIS — O0993 Supervision of high risk pregnancy, unspecified, third trimester: Secondary | ICD-10-CM

## 2023-12-18 NOTE — Telephone Encounter (Signed)
 Received a fax clarifying quantity for RX for Lorazepam for one tablet only. Will forward to provider.  Rock Skip PEAK

## 2023-12-24 ENCOUNTER — Encounter (HOSPITAL_COMMUNITY): Payer: Self-pay | Admitting: Obstetrics and Gynecology

## 2023-12-24 ENCOUNTER — Other Ambulatory Visit: Payer: Self-pay

## 2023-12-24 ENCOUNTER — Inpatient Hospital Stay (HOSPITAL_COMMUNITY)
Admission: AD | Admit: 2023-12-24 | Discharge: 2023-12-24 | Disposition: A | Discharge status: Home / Self Care | Payer: MEDICAID | Attending: Obstetrics and Gynecology | Admitting: Obstetrics and Gynecology

## 2023-12-24 DIAGNOSIS — Z3A31 31 weeks gestation of pregnancy: Secondary | ICD-10-CM | POA: Insufficient documentation

## 2023-12-24 DIAGNOSIS — O99613 Diseases of the digestive system complicating pregnancy, third trimester: Secondary | ICD-10-CM | POA: Diagnosis not present

## 2023-12-24 DIAGNOSIS — O99213 Obesity complicating pregnancy, third trimester: Secondary | ICD-10-CM | POA: Diagnosis not present

## 2023-12-24 DIAGNOSIS — O09293 Supervision of pregnancy with other poor reproductive or obstetric history, third trimester: Secondary | ICD-10-CM | POA: Insufficient documentation

## 2023-12-24 DIAGNOSIS — R109 Unspecified abdominal pain: Secondary | ICD-10-CM

## 2023-12-24 DIAGNOSIS — O0933 Supervision of pregnancy with insufficient antenatal care, third trimester: Secondary | ICD-10-CM | POA: Diagnosis not present

## 2023-12-24 DIAGNOSIS — O99013 Anemia complicating pregnancy, third trimester: Secondary | ICD-10-CM | POA: Diagnosis not present

## 2023-12-24 DIAGNOSIS — D649 Anemia, unspecified: Secondary | ICD-10-CM | POA: Diagnosis not present

## 2023-12-24 DIAGNOSIS — O26893 Other specified pregnancy related conditions, third trimester: Secondary | ICD-10-CM | POA: Diagnosis not present

## 2023-12-24 DIAGNOSIS — O26899 Other specified pregnancy related conditions, unspecified trimester: Secondary | ICD-10-CM

## 2023-12-24 DIAGNOSIS — R1030 Lower abdominal pain, unspecified: Secondary | ICD-10-CM | POA: Diagnosis present

## 2023-12-24 DIAGNOSIS — K589 Irritable bowel syndrome without diarrhea: Secondary | ICD-10-CM | POA: Diagnosis not present

## 2023-12-24 DIAGNOSIS — O3663X0 Maternal care for excessive fetal growth, third trimester, not applicable or unspecified: Secondary | ICD-10-CM | POA: Insufficient documentation

## 2023-12-24 LAB — WET PREP, GENITAL
Clue Cells Wet Prep HPF POC: NONE SEEN
Sperm: NONE SEEN
Trich, Wet Prep: NONE SEEN
WBC, Wet Prep HPF POC: <10
Yeast Wet Prep HPF POC: NONE SEEN

## 2023-12-24 LAB — URINALYSIS, MICROSCOPIC (REFLEX): Bacteria, UA: NONE SEEN

## 2023-12-24 LAB — URINALYSIS, ROUTINE W REFLEX MICROSCOPIC
Glucose, UA: NEGATIVE mg/dL
Hgb urine dipstick: NEGATIVE
Ketones, ur: NEGATIVE mg/dL
Nitrite: NEGATIVE
Protein, ur: NEGATIVE mg/dL
Specific Gravity, Urine: 1.025 (ref 1.005–1.030)
pH: 6 (ref 5.0–8.0)

## 2023-12-24 LAB — FETAL FIBRONECTIN: Fetal Fibronectin: NEGATIVE

## 2023-12-24 MED ORDER — CYCLOBENZAPRINE HCL 5 MG PO TABS
10.0000 mg | ORAL_TABLET | Freq: Once | ORAL | Status: AC
  Administered 2023-12-24: 10 mg via ORAL
  Filled 2023-12-24: qty 2

## 2023-12-24 MED ORDER — ACETAMINOPHEN 500 MG PO TABS
1000.0000 mg | ORAL_TABLET | Freq: Once | ORAL | Status: AC
  Administered 2023-12-24: 1000 mg via ORAL
  Filled 2023-12-24: qty 2

## 2023-12-24 MED ORDER — LACTATED RINGERS IV BOLUS: 1000.0000 mL | Freq: Once | INTRAVENOUS | Status: DC

## 2023-12-24 NOTE — Discharge Instructions (Signed)
 Reasons to return to MAU at [facility]: Less than 36 weeks: Contractions feels like menstrual cramps. You should go to the hospital if you have more than 6 contractions in an hour, even after you have rested and drank at least 16 ounces of water.  More than 36 weeks: You begin to have strong, frequent contractions 5 minutes apart or less, each last 1 minute, these have been going on for 1-2 hours, and you cannot walk or talk during them. Your water breaks.  Sometimes it is a big gush of fluid. However, many times it may it may be much more subtle. You should go to the hospital if you have a constant leakage of fluid from your vagina, enough to soak a pad when you are walking around.  You have vaginal bleeding.  It is normal to have a small amount of spotting if your cervix was checked. If you have bleeding requiring the use of a pad, go to the hospital. You don't feel your baby moving like normal.  If you think that you baby's movement is decreased, eat a snack and rest on your left side in a quiet room for one hour. If you have not felt the baby move more than 6 times in an hour GO TO THE HOSPITAL.     BRAXTON HICKS: Braxton Hicks contractions can happen for many weeks before real labor begins. These "practice" contractions can be very painful and can make you think you are in labor when you are not. You might notice them more at the end of the day. Usually, Braxton Hicks contractions are less regular and not as strong as "true" labor. Time your contractions and note whether they continue when you are resting and drinking water after you empty your bladder. If rest and hydration make the contractions go away, they are not true labor contractions.  Below is a summary of some differences between true labor and false labor. But sometimes the only way to tell the difference is by having a vaginal exam to find changes in your cervix that signal the start of labor.  Timing and  frequency of contractions: True labor contractions come at regular intervals. They have a pattern. As time goes on, they get closer together. Each lasts about 60 or 90 seconds. Braxton Hicks contractions do not have a pattern and they do not get closer together. These are called Braxton Hicks contractions.  Change with movement: True labor contractions continue even when you rest or move around. Braxton Hicks contractions may stop when you walk or rest. They also may stop with a change of position.  Strength of contractions: True labor contractions steadily get stronger. Braxton Hicks contractions are weak and do not get much stronger. They may start strong and then weaken.  Location of pain: Pain from true labor contractions usually starts in the back and moves to the front. Pain from [facility] contractions usually is felt only in the front.  What to try to tell the difference: Empty your bladder. Drink 16 ounces of fluid. Rest if you were active when contractions started OR walk around if you were resting when contractions started. Start timing your contractions. Note how long each contraction lasts, and how far apart different contractions are. If contractions are getting stronger and closer together with time, they are more likely labor contractions.

## 2023-12-24 NOTE — MAU Provider Note (Signed)
 Chief Complaint:  Abdominal Pain   HPI   None     Emily Golden is a 23 y.o. [id number] at [redacted]w[redacted]d who presents to maternity admissions reporting abdominal cramping.  She reports that lower abdominal cramps started yesterday.  She also feels an increase in pelvic pressure and hip pain.  She describes the abdominal pain similar to period cramps, starting at the top of her abdomen and going to the bottom of her abdomen feeling like a tightening sensation. She took Tylenol  and Flexeril  last night and was able to fall asleep. She reports the cramping has been occurring every 30-40 minutes. Denies vaginal bleeding, leaking of fluid.  Endorses fetal movement.  Denies recent intercourse, dysuria, abnormal vaginal discharge.  Pregnancy Course: Receives care at [facility] . Prenatal records reviewed.  Pregnancy complicated by LGA, anemia, late prenatal care, obesity, history of preeclampsia, IBS, s/p cholecystectomy.  Past Medical History:  Diagnosis Date   Allergy    Anxiety    Asthma    Bipolar 2 disorder (HCC)    Gallstones    PTSD (post-traumatic stress disorder)    SVT (supraventricular tachycardia)    OB History  Gravida Para Term Preterm AB Living  6 1 1  0 4 1  SAB IAB Ectopic Multiple Live Births  4 0 0  1    # Outcome Date GA Lbr Len/2nd Weight Sex Type Anes PTL Lv  6 Current           5 Term 12/20/22 [redacted]w[redacted]d 01:11 / 00:25 3340 g M Vag-Spont EPI  LIV  4 SAB 10/04/21 [redacted]w[redacted]d   U         Birth Comments: SAB managed with cytotec   3 SAB 2022          2 SAB         ND  1 SAB            Past Surgical History:  Procedure Laterality Date   CHOLECYSTECTOMY     compound fracture right arm Right    FRACTURE SURGERY     NASAL RECONSTRUCTION     Family History  Problem Relation Age of Onset   Diabetes Maternal Grandmother    Hypertension Maternal Grandmother    Hyperlipidemia Maternal Grandmother    Heart disease Maternal Grandfather    Cancer Maternal  Grandfather    Diabetes Maternal Grandfather    Heart failure Neg Hx    Social History[1] Allergies[2] Medications Prior to Admission  Medication Sig Dispense Refill Last Dose/Taking   acetaminophen  (TYLENOL ) 500 MG tablet Take 2 tablets (1,000 mg total) by mouth every 6 (six) hours as needed for mild pain.   12/23/2023   ALBUTEROL  IN Inhale 1-2 puffs into the lungs as needed.   Past Week   ARIPiprazole  (ABILIFY ) 5 MG tablet Take 1 tablet (5 mg total) by mouth daily. 30 tablet 1 Past Week   busPIRone  (BUSPAR ) 7.5 MG tablet Take 1 tablet (7.5 mg total) by mouth 2 (two) times daily. 60 tablet 1 Past Week   cyclobenzaprine  (FLEXERIL ) 5 MG tablet Take 1 tablet (5 mg total) by mouth 3 (three) times daily as needed for muscle spasms. 20 tablet 0 12/23/2023   ferrous sulfate  325 (65 FE) MG tablet Take 1 tablet (325 mg total) by mouth with breakfast on Monday, Wednesday, and Friday. 30 tablet 11 Past Week   LORazepam (ATIVAN) 2 MG tablet Take 1 tablet (2 mg total) by mouth every 6 (six) hours  as needed for anxiety. 1 tablet 0 Past Month   Prenatal Vit-Fe Fumarate-FA (MULTIVITAMIN-PRENATAL) 27-0.8 MG TABS tablet Take 1 tablet by mouth daily at 12 noon.   12/23/2023   propranolol  (INDERAL ) 10 MG tablet Take 1 tablet (10 mg total) by mouth at bedtime. 90 tablet 3 Past Week   beclomethasone (QVAR ) 80 MCG/ACT inhaler Inhale 2 puffs into the lungs daily as needed (for shortenss of breath).      HYDROcodone -acetaminophen  (NORCO/VICODIN) 5-325 MG tablet Take 2 tablets by mouth every 4 (four) hours as needed. 10 tablet 0 Unknown   hydrOXYzine  (ATARAX ) 25 MG tablet Take 2 tablets (50 mg total) by mouth 3 (three) times daily. (Patient not taking: Reported on 12/15/2023) 60 tablet 2    naloxone (NARCAN) nasal spray 4 mg/0.1 mL Place 1 spray into the nose once. (Patient not taking: Reported on 12/15/2023)      ondansetron  (ZOFRAN -ODT) 4 MG disintegrating tablet Take 1-2 tablets (4-8 mg total) by mouth every 8  (eight) hours as needed for nausea or vomiting. 20 tablet 2 Unknown    I have reviewed patient's Past Medical Hx, Surgical Hx, Family Hx, Social Hx, medications and allergies.   ROS  Pertinent items noted in HPI and remainder of comprehensive ROS otherwise negative.   PHYSICAL EXAM  Patient Vitals for the past 24 hrs:  BP Temp Temp src Pulse Resp SpO2 Height Weight  12/24/23 1100 123/62 -- -- 94 -- -- -- --  12/24/23 1048 124/72 97.9 F (36.6 C) Oral 100 18 99 % 5' 7 (1.702 m) (!) 145.4 kg    Constitutional: Well-developed, well-nourished female in no acute distress.  Cardiovascular: normal rate, warm and well-perfused Respiratory: normal effort, no problems with respiration noted GI: Abd soft, non-tender, non-distended MSK: Extremities nontender, no edema, normal ROM Skin: warm and dry. Acyanotic, no jaundice or pallor. Neurologic: Alert and oriented x 4. No abnormal coordination. Psychiatric: Normal mood. Speech not slurred, not rapid/pressured. Patient is cooperative. GU: no CVA tenderness Pelvic exam: VULVA: normal appearing vulva with no masses, tenderness or lesions, VAGINA: normal appearing vagina with normal color and discharge, no lesions, CERVIX: multiparous os, visually closed, exam chaperoned by Hunter Stanley RN.      Fetal Tracing: Baseline FHR: 125 per minute Fetal heart variability: moderate Fetal Heart Rate accelerations: yes - 10x10 appropriate for gestational age Fetal Heart Rate decelerations: none Fetal Non-stress Test: Category I (reactive) Toco: no uterine contractions   Labs: Results for orders placed or performed during the hospital encounter of 12/24/23 (from the past 24 hours)  Urinalysis, Routine w reflex microscopic -Urine, Clean Catch     Status: Abnormal   Collection Time: 12/24/23 11:12 AM  Result Value Ref Range   Color, Urine YELLOW YELLOW   APPearance CLEAR CLEAR   Specific Gravity, Urine 1.025 1.005 - 1.030   pH 6.0 5.0 - 8.0    Glucose, UA NEGATIVE NEGATIVE mg/dL   Hgb urine dipstick NEGATIVE NEGATIVE   Bilirubin Urine SMALL (A) NEGATIVE   Ketones, ur NEGATIVE NEGATIVE mg/dL   Protein, ur NEGATIVE NEGATIVE mg/dL   Nitrite NEGATIVE NEGATIVE   Leukocytes,Ua SMALL (A) NEGATIVE  Urinalysis, Microscopic (reflex)     Status: None   Collection Time: 12/24/23 11:12 AM  Result Value Ref Range   RBC / HPF 0-5 0 - 5 RBC/hpf   WBC, UA 6-10 0 - 5 WBC/hpf   Bacteria, UA NONE SEEN NONE SEEN   Squamous Epithelial / HPF 11-20 0 - 5 /HPF  Mucus PRESENT   Fetal fibronectin     Status: None   Collection Time: 12/24/23 11:25 AM  Result Value Ref Range   Fetal Fibronectin NEGATIVE NEGATIVE  Wet prep, genital     Status: None   Collection Time: 12/24/23 11:25 AM  Result Value Ref Range   Yeast Wet Prep HPF POC NONE SEEN NONE SEEN   Trich, Wet Prep NONE SEEN NONE SEEN   Clue Cells Wet Prep HPF POC NONE SEEN NONE SEEN   WBC, Wet Prep HPF POC <10 <10   Sperm NONE SEEN     Imaging:  No results found.  MDM & MAU COURSE  MDM: Moderate  MAU Course: -Vital signs within normal limits. -No cervical dilation on exam. Fetal fibronectin collected to confirm no preterm labor. -UA and wet prep to rule out infection. -Tylenol  and Flexeril  for abdominal cramping. -UA and wet prep negative for infection. -Fetal fibronectin negative, confirmed no preterm labor. -Encourage PO hydration.  Differential diagnosis considered for lower abdominal pain includes but is not limited to: round ligament pain, preterm labor, UTI, pyelonephritis, PID, cervicitis  Orders Placed This Encounter  Procedures   Wet prep, genital   Urinalysis, Routine w reflex microscopic -Urine, Clean Catch   Fetal fibronectin   Urinalysis, Microscopic (reflex)   Discharge patient   Meds ordered this encounter  Medications   lactated ringers  bolus 1,000 mL   acetaminophen  (TYLENOL ) tablet 1,000 mg   cyclobenzaprine  (FLEXERIL ) tablet 10 mg    ASSESSMENT    1. Abdominal cramping affecting pregnancy   2. [redacted] weeks gestation of pregnancy     PLAN  Discharge home in stable condition with return precautions.  Recommend increased hydration and protein intake. Discussed Darol Irving. Can use Tylenol  and Flexeril  prn at home for cramping.     Allergies as of 12/24/2023       Reactions   Other Shortness Of Breath   Dust, Reports smells,perfumes,aerosols HX asthma   Latex Rash        Medication List     TAKE these medications    acetaminophen  500 MG tablet Commonly known as: TYLENOL  Take 2 tablets (1,000 mg total) by mouth every 6 (six) hours as needed for mild pain.   ALBUTEROL  IN Inhale 1-2 puffs into the lungs as needed.   ARIPiprazole  5 MG tablet Commonly known as: Abilify  Take 1 tablet (5 mg total) by mouth daily.   beclomethasone 80 MCG/ACT inhaler Commonly known as: QVAR  Inhale 2 puffs into the lungs daily as needed (for shortenss of breath).   busPIRone  7.5 MG tablet Commonly known as: BUSPAR  Take 1 tablet (7.5 mg total) by mouth 2 (two) times daily.   cyclobenzaprine  5 MG tablet Commonly known as: FLEXERIL  Take 1 tablet (5 mg total) by mouth 3 (three) times daily as needed for muscle spasms.   ferrous sulfate  325 (65 FE) MG tablet Take 1 tablet (325 mg total) by mouth with breakfast on Monday, Wednesday, and Friday.   HYDROcodone -acetaminophen  5-325 MG tablet Commonly known as: NORCO/VICODIN Take 2 tablets by mouth every 4 (four) hours as needed.   hydrOXYzine  25 MG tablet Commonly known as: ATARAX  Take 2 tablets (50 mg total) by mouth 3 (three) times daily.   LORazepam 2 MG tablet Commonly known as: Ativan Take 1 tablet (2 mg total) by mouth every 6 (six) hours as needed for anxiety.   multivitamin-prenatal 27-0.8 MG Tabs tablet Take 1 tablet by mouth daily at 12 noon.   naloxone 4  MG/0.1ML Liqd nasal spray kit Commonly known as: NARCAN Place 1 spray into the nose once.   ondansetron  4 MG  disintegrating tablet Commonly known as: ZOFRAN -ODT Take 1-2 tablets (4-8 mg total) by mouth every 8 (eight) hours as needed for nausea or vomiting.   propranolol  10 MG tablet Commonly known as: INDERAL  Take 1 tablet (10 mg total) by mouth at bedtime.        Joesph DELENA Sear, PA      [1]  Social History Tobacco Use   Smoking status: Never    Passive exposure: Never   Smokeless tobacco: Never   Tobacco comments:    Stopped smoking 3 months before she got pregnant  Vaping Use   Vaping status: Former   Substances: Nicotine   Substance Use Topics   Alcohol use: Not Currently   Drug use: Never  [2]  Allergies Allergen Reactions   Other Shortness Of Breath    Dust, Reports smells,perfumes,aerosols HX asthma    Latex Rash

## 2023-12-24 NOTE — MAU Note (Signed)
 Emily Golden is a 23 y.o. at [redacted]w[redacted]d here in MAU reporting: started yesterday with lower abdominal cramping that felt like period cramps. Feels an increase in pelvic pressure and hip pains. States the pain is increasing and starts at the top of her abdomen and goes down her abdomen. Feels like her abdomen is tightening. Denies any LOF or VB. Reports +FM, feels like her kicks are not as strong. Denies any N/V/D. Denies any body aches or fevers.   Denies any recent sexual intercourse. Patient denies any pain with urination or urinary frequency.    Onset of complaint: yesterday  Pain score: abd 7   pelvic/hip 5 Vitals:   12/24/23 1048  BP: 124/72  Pulse: 100  Resp: 18  Temp: 97.9 F (36.6 C)  SpO2: 99%     FHT:138 Lab orders placed from triage:  UA

## 2023-12-31 ENCOUNTER — Telehealth: Payer: MEDICAID | Admitting: Family

## 2023-12-31 DIAGNOSIS — R399 Unspecified symptoms and signs involving the genitourinary system: Secondary | ICD-10-CM

## 2023-12-31 DIAGNOSIS — Z3490 Encounter for supervision of normal pregnancy, unspecified, unspecified trimester: Secondary | ICD-10-CM

## 2023-12-31 NOTE — Progress Notes (Signed)
" °  Because of your pregnancy with UTI symptoms , I feel your condition warrants further evaluation and I recommend that you be seen in a face-to-face visit.   NOTE: There will be NO CHARGE for this E-Visit   If you are having a true medical emergency, please call 911.     For an urgent face to face visit, [place] has multiple urgent care centers for your convenience.  Click the link below for the full list of locations and hours, walk-in wait times, appointment scheduling options and driving directions:  Urgent Care - [place], [place], [place], [place], [place], [place]  [place]     Your MyChart E-visit questionnaire answers were reviewed by a board certified advanced clinical practitioner to complete your personal care plan based on your specific symptoms.    Thank you for using e-Visits.    "

## 2024-01-02 ENCOUNTER — Other Ambulatory Visit: Payer: MEDICAID

## 2024-01-02 ENCOUNTER — Ambulatory Visit: Payer: MEDICAID

## 2024-01-02 ENCOUNTER — Ambulatory Visit (INDEPENDENT_AMBULATORY_CARE_PROVIDER_SITE_OTHER): Payer: MEDICAID | Admitting: Obstetrics and Gynecology

## 2024-01-02 ENCOUNTER — Other Ambulatory Visit: Payer: Self-pay

## 2024-01-02 VITALS — BP 114/83 | HR 146 | Wt 318.6 lb

## 2024-01-02 DIAGNOSIS — Z8279 Family history of other congenital malformations, deformations and chromosomal abnormalities: Secondary | ICD-10-CM

## 2024-01-02 DIAGNOSIS — O35BXX1 Maternal care for other (suspected) fetal abnormality and damage, fetal cardiac anomalies, fetus 1: Secondary | ICD-10-CM

## 2024-01-02 DIAGNOSIS — O0993 Supervision of high risk pregnancy, unspecified, third trimester: Secondary | ICD-10-CM

## 2024-01-02 DIAGNOSIS — O3663X0 Maternal care for excessive fetal growth, third trimester, not applicable or unspecified: Secondary | ICD-10-CM

## 2024-01-02 DIAGNOSIS — O3660X0 Maternal care for excessive fetal growth, unspecified trimester, not applicable or unspecified: Secondary | ICD-10-CM

## 2024-01-02 DIAGNOSIS — Z9049 Acquired absence of other specified parts of digestive tract: Secondary | ICD-10-CM | POA: Diagnosis not present

## 2024-01-02 DIAGNOSIS — Z6841 Body Mass Index (BMI) 40.0 and over, adult: Secondary | ICD-10-CM | POA: Insufficient documentation

## 2024-01-02 DIAGNOSIS — O09293 Supervision of pregnancy with other poor reproductive or obstetric history, third trimester: Secondary | ICD-10-CM

## 2024-01-02 DIAGNOSIS — Z3A33 33 weeks gestation of pregnancy: Secondary | ICD-10-CM

## 2024-01-02 DIAGNOSIS — R000 Tachycardia, unspecified: Secondary | ICD-10-CM | POA: Insufficient documentation

## 2024-01-02 DIAGNOSIS — O35BXX0 Maternal care for other (suspected) fetal abnormality and damage, fetal cardiac anomalies, not applicable or unspecified: Secondary | ICD-10-CM

## 2024-01-02 DIAGNOSIS — O99213 Obesity complicating pregnancy, third trimester: Secondary | ICD-10-CM | POA: Diagnosis not present

## 2024-01-02 DIAGNOSIS — O09299 Supervision of pregnancy with other poor reproductive or obstetric history, unspecified trimester: Secondary | ICD-10-CM

## 2024-01-02 DIAGNOSIS — O99210 Obesity complicating pregnancy, unspecified trimester: Secondary | ICD-10-CM

## 2024-01-02 NOTE — Progress Notes (Signed)
" ° °  PRENATAL VISIT NOTE  Subjective:  Emily Golden is a 23 y.o. H3E8958 at [redacted]w[redacted]d being seen today for ongoing prenatal care.  She is currently monitored for the following issues for this high-risk pregnancy and has PTSD (post-traumatic stress disorder); Suicide attempt by drug ingestion (HCC); Bipolar 2 disorder (HCC); GAD (generalized anxiety disorder); ADHD; Tobacco use disorder; Alcohol consumption binge drinking; Grief; S/P cholecystectomy; Hx Fetal tetralogy of Fallot affecting antepartum care of mother; Irritable bowel syndrome; Maternal varicella, non-immune; Obesity in pregnancy; Family history of congenital heart defect; Pilonidal cyst; Supervision of high risk pregnancy in third trimester; Hx of preeclampsia, prior pregnancy, currently pregnant; Anemia during pregnancy; LGA (large for gestational age) fetus affecting mother, antepartum; BMI 45.0-49.9, adult (HCC); and Tachycardia, unspecified on their problem list.  Patient doing well with no acute concerns today. She reports intermittent dizzy spells and heart racing with palpitations.  Contractions: Irritability (cramping). Vag. Bleeding: None.  Movement: Present. Denies leaking of fluid.   The following portions of the patient's history were reviewed and updated as appropriate: allergies, current medications, past family history, past medical history, past social history, past surgical history and problem list. Problem list updated.  Objective:   Vitals:   01/02/24 1626  BP: 114/83  Pulse: (!) 146  Weight: (!) 318 lb 9.6 oz (144.5 kg)    Fetal Status: Fetal Heart Rate (bpm): 161 Fundal Height: 33 cm Movement: Present     General:  Alert, oriented and cooperative. Patient is in no acute distress.  Skin: Skin is warm and dry. No rash noted.   Cardiovascular: Normal heart rate noted  Respiratory: Normal respiratory effort, no problems with respiration noted  Abdomen: Soft, gravid, appropriate for gestational age.  Pain/Pressure:  Present (lower pelvic and cervix and with ovement of hips)     Pelvic: Cervical exam deferred        Extremities: Normal range of motion.  Edema: None  Mental Status:  Normal mood and affect. Normal behavior. Normal judgment and thought content.   Assessment and Plan:  Pregnancy: H3E8958 at [redacted]w[redacted]d  1. [redacted] weeks gestation of pregnancy (Primary)   2. Tetralogy of Fallot of fetus affecting management of mother, antepartum, single or unspecified fetus   3. Supervision of high risk pregnancy in third trimester Conmtinue routine prenatal care  4. S/P cholecystectomy No current GI issues  5. Excessive fetal growth affecting pregnancy, antepartum, single or unspecified fetus Fetal growth scan on 01/23/24 as currently scheduled  6. Hx of preeclampsia, prior pregnancy, currently pregnant No s/sx of preeclampsia  7. Family history of congenital heart defect   8. Obesity in pregnancy Per MFM weekly testing at 34 weeks, request placing  9. BMI 45.0-49.9, adult (HCC)   10. Tachycardia, unspecified Referral sent to [facility] cards, Dr. Renford. Pt currently takes propranolol  when she feels her heart racing, but she has had no cardiac workup this pregnancy. Message sent to Dr. Renford to request possible outpt cardiac monitoring or echo for further eval - AMB Referral to Cardio Obstetrics  Preterm labor symptoms and general obstetric precautions including but not limited to vaginal bleeding, contractions, leaking of fluid and fetal movement were reviewed in detail with the patient.  Please refer to After Visit Summary for other counseling recommendations.   Return in about 2 weeks (around 01/16/2024) for [facility], in person.   Jerilynn Buddle, MD Faculty Attending Center for [facility]   "

## 2024-01-03 ENCOUNTER — Other Ambulatory Visit: Payer: MEDICAID

## 2024-01-03 DIAGNOSIS — O0993 Supervision of high risk pregnancy, unspecified, third trimester: Secondary | ICD-10-CM

## 2024-01-04 LAB — GLUCOSE TOLERANCE, 2 HOURS W/ 1HR
Glucose, 1 hour: 119 mg/dL (ref 70–179)
Glucose, 2 hour: 69 mg/dL — ABNORMAL LOW (ref 70–152)
Glucose, Fasting: 69 mg/dL — ABNORMAL LOW (ref 70–91)

## 2024-01-04 NOTE — L&D Delivery Note (Signed)
 Delivery Note Emily Golden is a 24 y.o. H3E7957 at [redacted]w[redacted]d admitted for IOL for gHTN.   GBS Status:  Negative/-- (01/20 1055)  Labor course: Initial SVE: 0.5/50/-4. Augmentation with: AROM, Pitocin , and Cytotec . She then progressed to complete.  ROM: 1h 52m with clear fluid  Birth: After a brief 2nd stage, she delivered a Live born female  Birth Weight:   APGAR: 8, 9  Newborn Delivery   Birth date/time: [birth year]20[birth year] 11:54:00 Delivery type: Vaginal, Spontaneous        Delivered via spontaneous vaginal delivery (Presentation: LOA ). Nuchal cord present: Yes. Nuchal reduced at perineum. Shoulders and body delivered in usual fashion. Infant placed directly on mom's abdomen for bonding/skin-to-skin, baby dried and stimulated. Cord clamped x 2 after 1 minute and cut by FOB.  Cord blood collected. Placenta delivered-Spontaneous with 3 vessels. 20u Pitocin  in 500cc LR given as a bolus before delivery of placenta.  Fundus firm with massage. Placenta inspected and appears to be intact with a 3 VC.  Sponge and instrument count were correct x2.  Intrapartum complications:  None Anesthesia:  epidural Lacerations:  1st degree Suture Repair:  3-0 Vicryl EBL (mL):126.00   Newborn Data: Birth date:[birth year]20[birth year] Birth time:11:54 AM Gender:Female Living status:Living Apgars:8 ,9  Weight:     Mom to postpartum.  Baby to Couplet care / Skin to Skin. Placenta to L&D   Plans to Breastfeed Contraception: IUD Placed post-placental without any complications. Circumcision: N/A  Note sent to [facility]: [facility]W for pp visit.  Delivery Report:  Review the Delivery Report for details.     Signed: Tawni SAUNDERS Rance Smithson, DNP,CNM 02/08/2024, 12:59 PM

## 2024-01-08 ENCOUNTER — Other Ambulatory Visit: Payer: Self-pay

## 2024-01-08 DIAGNOSIS — O35BXX0 Maternal care for other (suspected) fetal abnormality and damage, fetal cardiac anomalies, not applicable or unspecified: Secondary | ICD-10-CM

## 2024-01-08 DIAGNOSIS — Z8279 Family history of other congenital malformations, deformations and chromosomal abnormalities: Secondary | ICD-10-CM

## 2024-01-09 ENCOUNTER — Other Ambulatory Visit: Payer: MEDICAID

## 2024-01-09 ENCOUNTER — Other Ambulatory Visit: Payer: Self-pay

## 2024-01-09 DIAGNOSIS — Z8279 Family history of other congenital malformations, deformations and chromosomal abnormalities: Secondary | ICD-10-CM

## 2024-01-09 DIAGNOSIS — O35BXX0 Maternal care for other (suspected) fetal abnormality and damage, fetal cardiac anomalies, not applicable or unspecified: Secondary | ICD-10-CM

## 2024-01-09 DIAGNOSIS — Z3A34 34 weeks gestation of pregnancy: Secondary | ICD-10-CM

## 2024-01-11 ENCOUNTER — Telehealth (HOSPITAL_COMMUNITY): Payer: Self-pay | Admitting: Physician Assistant

## 2024-01-11 NOTE — Telephone Encounter (Signed)
 Patient stopped by for walk in appt with none left. Patient is out of Buspar  and Abilify . Patient has an appt on 01/23/2024 and would like a bridge until then. Patient is about to have a baby. Best number to reach her is [phone]. Please advise. Thank you.

## 2024-01-12 ENCOUNTER — Ambulatory Visit: Payer: Self-pay | Admitting: Obstetrics and Gynecology

## 2024-01-15 ENCOUNTER — Other Ambulatory Visit: Payer: Self-pay | Admitting: General Practice

## 2024-01-15 DIAGNOSIS — O0993 Supervision of high risk pregnancy, unspecified, third trimester: Secondary | ICD-10-CM

## 2024-01-16 ENCOUNTER — Other Ambulatory Visit: Payer: MEDICAID

## 2024-01-19 ENCOUNTER — Encounter: Payer: Self-pay | Admitting: Medical

## 2024-01-19 ENCOUNTER — Other Ambulatory Visit: Payer: Self-pay

## 2024-01-19 DIAGNOSIS — O3660X0 Maternal care for excessive fetal growth, unspecified trimester, not applicable or unspecified: Secondary | ICD-10-CM

## 2024-01-19 DIAGNOSIS — O09299 Supervision of pregnancy with other poor reproductive or obstetric history, unspecified trimester: Secondary | ICD-10-CM

## 2024-01-22 ENCOUNTER — Encounter: Payer: Self-pay | Admitting: Obstetrics and Gynecology

## 2024-01-22 ENCOUNTER — Ambulatory Visit: Payer: MEDICAID | Admitting: Obstetrics and Gynecology

## 2024-01-22 ENCOUNTER — Other Ambulatory Visit: Payer: Self-pay | Admitting: Obstetrics and Gynecology

## 2024-01-22 ENCOUNTER — Ambulatory Visit: Payer: MEDICAID

## 2024-01-22 ENCOUNTER — Ambulatory Visit: Payer: MEDICAID | Attending: Maternal & Fetal Medicine | Admitting: Maternal & Fetal Medicine

## 2024-01-22 ENCOUNTER — Other Ambulatory Visit: Payer: Self-pay

## 2024-01-22 ENCOUNTER — Other Ambulatory Visit (HOSPITAL_COMMUNITY)
Admission: RE | Admit: 2024-01-22 | Discharge: 2024-01-22 | Disposition: A | Discharge status: Home / Self Care | Payer: MEDICAID | Source: Ambulatory Visit | Attending: Obstetrics and Gynecology | Admitting: Obstetrics and Gynecology

## 2024-01-22 VITALS — BP 132/63 | HR 115

## 2024-01-22 VITALS — BP 134/82 | HR 106 | Wt 320.0 lb

## 2024-01-22 DIAGNOSIS — Z8279 Family history of other congenital malformations, deformations and chromosomal abnormalities: Secondary | ICD-10-CM | POA: Diagnosis not present

## 2024-01-22 DIAGNOSIS — O0993 Supervision of high risk pregnancy, unspecified, third trimester: Secondary | ICD-10-CM | POA: Insufficient documentation

## 2024-01-22 DIAGNOSIS — O3660X0 Maternal care for excessive fetal growth, unspecified trimester, not applicable or unspecified: Secondary | ICD-10-CM

## 2024-01-22 DIAGNOSIS — O99013 Anemia complicating pregnancy, third trimester: Secondary | ICD-10-CM

## 2024-01-22 DIAGNOSIS — O35BXX0 Maternal care for other (suspected) fetal abnormality and damage, fetal cardiac anomalies, not applicable or unspecified: Secondary | ICD-10-CM

## 2024-01-22 DIAGNOSIS — O09293 Supervision of pregnancy with other poor reproductive or obstetric history, third trimester: Secondary | ICD-10-CM | POA: Insufficient documentation

## 2024-01-22 DIAGNOSIS — Z3A36 36 weeks gestation of pregnancy: Secondary | ICD-10-CM | POA: Diagnosis not present

## 2024-01-22 DIAGNOSIS — R000 Tachycardia, unspecified: Secondary | ICD-10-CM

## 2024-01-22 DIAGNOSIS — E669 Obesity, unspecified: Secondary | ICD-10-CM | POA: Diagnosis not present

## 2024-01-22 DIAGNOSIS — O99213 Obesity complicating pregnancy, third trimester: Secondary | ICD-10-CM | POA: Insufficient documentation

## 2024-01-22 DIAGNOSIS — O09299 Supervision of pregnancy with other poor reproductive or obstetric history, unspecified trimester: Secondary | ICD-10-CM

## 2024-01-22 DIAGNOSIS — Z6841 Body Mass Index (BMI) 40.0 and over, adult: Secondary | ICD-10-CM | POA: Diagnosis not present

## 2024-01-22 DIAGNOSIS — O3663X0 Maternal care for excessive fetal growth, third trimester, not applicable or unspecified: Secondary | ICD-10-CM

## 2024-01-22 DIAGNOSIS — O99210 Obesity complicating pregnancy, unspecified trimester: Secondary | ICD-10-CM | POA: Diagnosis not present

## 2024-01-22 DIAGNOSIS — O99019 Anemia complicating pregnancy, unspecified trimester: Secondary | ICD-10-CM

## 2024-01-22 NOTE — Progress Notes (Signed)
 "  PRENATAL VISIT NOTE  Subjective:  Emily Golden is a 24 y.o. H3E8958 at [redacted]w[redacted]d being seen today for ongoing prenatal care.  She is currently monitored for the following issues for this high-risk pregnancy and has PTSD (post-traumatic stress disorder); Suicide attempt by drug ingestion (HCC); Bipolar 2 disorder (HCC); GAD (generalized anxiety disorder); ADHD; Tobacco use disorder; Alcohol consumption binge drinking; Grief; Hx Fetal tetralogy of Fallot affecting antepartum care of mother; Irritable bowel syndrome; Maternal varicella, non-immune; Obesity in pregnancy; Family history of congenital heart defect; Pilonidal cyst; Supervision of high risk pregnancy in third trimester; Hx of preeclampsia, prior pregnancy, currently pregnant; Anemia during pregnancy; LGA (large for gestational age) fetus affecting mother, antepartum; BMI 45.0-49.9, adult (HCC); and Tachycardia, unspecified on their problem list.  Patient reports intermittent contractions.  Contractions: Irritability. Vag. Bleeding: None.  Movement: Present. Denies leaking of fluid.   The following portions of the patient's history were reviewed and updated as appropriate: allergies, current medications, past family history, past medical history, past social history, past surgical history and problem list.   Objective:   Vitals:   01/22/24 1623  BP: 134/82  Pulse: (!) 106  Weight: (!) 320 lb (145.2 kg)    Fetal Status:  Fetal Heart Rate (bpm): 130   Movement: Present    General: Alert, oriented and cooperative. Patient is in no acute distress.  Skin: Skin is warm and dry. No rash noted.   Cardiovascular: Normal heart rate noted  Respiratory: Normal respiratory effort, no problems with respiration noted  Abdomen: Soft, gravid, appropriate for gestational age.  Pain/Pressure: Present (pain and pressure lower pelvic)     Pelvic: Cervical exam deferred        Extremities: Normal range of motion.  Edema: None  Mental Status: Normal mood  and affect. Normal behavior. Normal judgment and thought content.      11/29/2023   10:14 AM 09/27/2023   10:04 AM 10/08/2021   11:53 AM  Depression screen PHQ 2/9  Decreased Interest 1  2  Down, Depressed, Hopeless 1  2  PHQ - 2 Score 2  4  Altered sleeping 0  1  Tired, decreased energy 1  3  Change in appetite 0  1  Feeling bad or failure about yourself  0  0  Trouble concentrating 0  2  Moving slowly or fidgety/restless 0  0  Suicidal thoughts 0  0  PHQ-9 Score 3  11   Difficult doing work/chores        Information is confidential and restricted. Go to Review Flowsheets to unlock data.   Data saved with a previous flowsheet row definition        11/29/2023   10:14 AM 09/27/2023   10:06 AM 10/08/2021   11:54 AM 09/22/2021   12:29 PM  GAD 7 : Generalized Anxiety Score  Nervous, Anxious, on Edge 2   2  3    Control/stop worrying 2   2  3    Worry too much - different things 2   1  3    Trouble relaxing 2   2  2    Restless 1   2  3    Easily annoyed or irritable 2   2  2    Afraid - awful might happen 1   1  3    Total GAD 7 Score 12  12 19   Anxiety Difficulty         Information is confidential and restricted. Go to Review Flowsheets to unlock data.  Data saved with a previous flowsheet row definition    Assessment and Plan:  Pregnancy: H3E8958 at [redacted]w[redacted]d  1. Family history of congenital heart defect (Primary)  2. Supervision of high risk pregnancy in third trimester - Cervicovaginal ancillary only - Culture, beta strep (group b only)  3. Obesity in pregnancy Cont baby aspirin  4. Tachycardia, unspecified Per MFM, they have contacted cards to get patient in earlier for appt and will change delivery plan as needed pending cardiology input  5. BMI 45.0-49.9, adult (HCC)  6. Excessive fetal growth affecting pregnancy, antepartum, single or unspecified fetus Wnl on anatomy today  7. Anemia during pregnancy  8. Hx of preeclampsia, prior pregnancy, currently  pregnant normotensive  9. [redacted] weeks gestation of pregnancy   Preterm labor symptoms and general obstetric precautions including but not limited to vaginal bleeding, contractions, leaking of fluid and fetal movement were reviewed in detail with the patient. Please refer to After Visit Summary for other counseling recommendations.   Return in about 1 week (around 01/29/2024) for high OB.  Future Appointments  Date Time Provider Department Center  01/23/2024  5:30 PM Collene Reginia BRAVO, [place] [facility] None  01/25/2024  4:15 PM Eveline Lynwood MATSU, MD [facility]  01/30/2024  3:15 PM [facility] PROVIDER 1 [facility]  01/30/2024  3:30 PM [facility] US3 [facility]  02/01/2024  1:15 PM Cleatus Moccasin, MD [facility]  02/02/2024  2:00 PM Sheena Pugh, DO [facility] None  02/07/2024  9:55 AM Fredirick Glenys RAMAN, MD [address]  02/16/2024 10:35 AM Herchel, Gloris LABOR, MD [facility]  02/21/2024  4:15 PM Ilean, Norleen GAILS, MD [facility]    Burnard CHRISTELLA Moats, MD  "

## 2024-01-22 NOTE — Progress Notes (Signed)
 "  Patient information  Patient Name: Emily Golden  Patient MRN:   [id number]  Referring practice: MFM Referring Provider: [facility])  Problem List   Patient Active Problem List   Diagnosis Date Noted   BMI 45.0-49.9, adult (HCC) 01/02/2024   Tachycardia, unspecified 01/02/2024   Anemia during pregnancy 12/14/2023   LGA (large for gestational age) fetus affecting mother, antepartum 12/14/2023   Supervision of high risk pregnancy in third trimester 11/29/2023   Hx of preeclampsia, prior pregnancy, currently pregnant 11/29/2023   Pilonidal cyst 11/02/2023   Family history of congenital heart defect 08/16/2023   Maternal varicella, non-immune 11/08/2022   Hx Fetal tetralogy of Fallot affecting antepartum care of mother 10/07/2022   Obesity in pregnancy 07/11/2022   Irritable bowel syndrome 05/26/2022   Grief 03/07/2022   Bipolar 2 disorder (HCC) 07/02/2021   GAD (generalized anxiety disorder) 07/02/2021   ADHD 07/02/2021   Tobacco use disorder 07/02/2021   Alcohol consumption binge drinking 07/02/2021   Suicide attempt by drug ingestion (HCC) 06/09/2018   PTSD (post-traumatic stress disorder)    Maternal Fetal medicine Consult  Emily Golden is a 24 y.o. H3E8958 at [redacted]w[redacted]d here for ultrasound and consultation. Emily Golden is doing well today with no acute concerns. Today we focused on the following:   The patient presents today for her first visit with our office during this pregnancy. Her medical history is notable for elevated BMI with maternal obesity, a previously large-for-gestational-age fetus, and a history of drug and alcohol use in the past, with no current use reported. She also has a history of a prior child affected by tetralogy of Fallot. In the current pregnancy, a fetal echocardiogram was performed and was normal, with no evidence of structural cardiac abnormalities.  Today she underwent a detailed anatomy ultrasound and biophysical profile. The  biophysical profile is reassuring at 8/8. The anatomic survey is limited due to advanced gestational age, though no structural abnormalities are identified.  The patient reports ongoing symptoms of palpitations, lightheadedness, and presyncopal episodes occurring multiple times per week. She has a cardiology appointment scheduled for the 30th. She reports a prior echocardiogram that was normal, though this study was performed some time ago. We discussed activity modification, including avoiding strenuous exertion, changing positions slowly, and minimizing triggers for tachycardia. She was counseled to seek immediate evaluation in the emergency department for chest pain, worsening palpitations, syncope, or other concerning symptoms.  I will message Dr. SHEENA to request an earlier cardiology appointment if possible.  RECOMMENDATIONS -Continue routine prenatal care with ongoing antenatal surveillance -Reassuring BPP today (8/8); no structural abnormalities identified; continue weekly antenatal testing.  -Avoid strenuous exertion; rise slowly and avoid activities that provoke tachycardia -Strict return precautions for chest pain, syncope, or worsening palpitations with ER evaluation if symptoms occur -Follow up with cardiology as scheduled; attempt to move appointment earlier if possible -Continue monitoring fetal movement and report any concerns promptly  The patient had time to ask questions that were answered to her satisfaction.  She verbalized understanding and agrees to proceed with the plan above.   I spent 30 minutes reviewing the patients chart, including labs and images as well as counseling the patient about her medical conditions. Greater than 50% of the time was spent in direct face-to-face patient counseling.  Delora Smaller  MFM,    01/22/2024  4:10 PM   Review of Systems: A review of systems was performed and was negative except per HPI  Vitals and Physical Exam     01/22/2024    3:17 PM 01/02/2024    4:26 PM 12/24/2023   12:27 PM  Vitals with BMI  Weight  318 lbs 10 oz   BMI  49.89   Systolic 132 114 864  Diastolic 63 83 76  Pulse 115 146 97    Sitting comfortably on the sonogram table Nonlabored breathing Normal rate and rhythm Abdomen is nontender  Past pregnancies OB History  Gravida Para Term Preterm AB Living  6 1 1  0 4 1  SAB IAB Ectopic Multiple Live Births  4 0 0  1    # Outcome Date GA Lbr Len/2nd Weight Sex Type Anes PTL Lv  6 Current           5 Term 12/20/22 [redacted]w[redacted]d 01:11 / 00:25 7 lb 5.8 oz (3.34 kg) M Vag-Spont EPI  LIV  4 SAB 10/04/21 [redacted]w[redacted]d   U         Birth Comments: SAB managed with cytotec   3 SAB 2022          2 SAB         ND  1 SAB              Future Appointments  Date Time Provider Department Center  01/22/2024  4:15 PM Nicholaus Burnard HERO, MD [facility]  01/23/2024  5:30 PM Collene Reginia BRAVO, [place] [facility] None  01/25/2024  4:15 PM Eveline Lynwood MATSU, MD [facility]  02/01/2024  1:15 PM Cleatus Moccasin, MD [facility]  02/02/2024  2:00 PM Sheena Pugh, DO [facility] None  02/07/2024  9:55 AM Fredirick Glenys RAMAN, MD [facility]  02/16/2024 10:35 AM Herchel, Gloris LABOR, MD [facility]  02/21/2024  4:15 PM Ilean Norleen GAILS, MD [facility]      "

## 2024-01-23 ENCOUNTER — Other Ambulatory Visit: Payer: MEDICAID

## 2024-01-23 ENCOUNTER — Ambulatory Visit: Payer: MEDICAID

## 2024-01-23 ENCOUNTER — Telehealth (INDEPENDENT_AMBULATORY_CARE_PROVIDER_SITE_OTHER): Payer: MEDICAID | Admitting: Physician Assistant

## 2024-01-23 DIAGNOSIS — F411 Generalized anxiety disorder: Secondary | ICD-10-CM | POA: Diagnosis not present

## 2024-01-23 DIAGNOSIS — O0993 Supervision of high risk pregnancy, unspecified, third trimester: Secondary | ICD-10-CM

## 2024-01-23 DIAGNOSIS — F3181 Bipolar II disorder: Secondary | ICD-10-CM | POA: Diagnosis not present

## 2024-01-23 DIAGNOSIS — O35BXX0 Maternal care for other (suspected) fetal abnormality and damage, fetal cardiac anomalies, not applicable or unspecified: Secondary | ICD-10-CM

## 2024-01-23 MED ORDER — BUSPIRONE HCL 7.5 MG PO TABS: ORAL_TABLET | ORAL | 0 refills | Status: DC

## 2024-01-23 MED ORDER — ARIPIPRAZOLE 5 MG PO TABS: ORAL_TABLET | ORAL | 0 refills | Status: DC

## 2024-01-23 MED ORDER — ARIPIPRAZOLE 10 MG PO TABS: 10.0000 mg | ORAL_TABLET | Freq: Every day | ORAL | 1 refills | Status: AC

## 2024-01-23 MED ORDER — BUSPIRONE HCL 10 MG PO TABS: 10.0000 mg | ORAL_TABLET | Freq: Two times a day (BID) | ORAL | 1 refills | Status: AC

## 2024-01-24 LAB — CERVICOVAGINAL ANCILLARY ONLY
Chlamydia: NEGATIVE
Comment: NEGATIVE
Comment: NORMAL
Neisseria Gonorrhea: NEGATIVE

## 2024-01-25 ENCOUNTER — Encounter: Payer: Self-pay | Admitting: Obstetrics & Gynecology

## 2024-01-25 ENCOUNTER — Encounter: Payer: Self-pay | Admitting: Certified Nurse Midwife

## 2024-01-25 ENCOUNTER — Other Ambulatory Visit: Payer: Self-pay

## 2024-01-25 ENCOUNTER — Ambulatory Visit: Payer: Self-pay | Admitting: Obstetrics and Gynecology

## 2024-01-25 ENCOUNTER — Inpatient Hospital Stay (HOSPITAL_COMMUNITY)
Admission: AD | Admit: 2024-01-25 | Discharge: 2024-01-25 | Disposition: A | Discharge status: Home / Self Care | Payer: MEDICAID | Attending: Family Medicine | Admitting: Family Medicine

## 2024-01-25 ENCOUNTER — Telehealth: Payer: Self-pay | Admitting: Family Medicine

## 2024-01-25 ENCOUNTER — Encounter (HOSPITAL_COMMUNITY): Payer: Self-pay | Admitting: Obstetrics & Gynecology

## 2024-01-25 DIAGNOSIS — O99891 Other specified diseases and conditions complicating pregnancy: Secondary | ICD-10-CM | POA: Insufficient documentation

## 2024-01-25 DIAGNOSIS — O99013 Anemia complicating pregnancy, third trimester: Secondary | ICD-10-CM | POA: Insufficient documentation

## 2024-01-25 DIAGNOSIS — R000 Tachycardia, unspecified: Secondary | ICD-10-CM | POA: Diagnosis not present

## 2024-01-25 DIAGNOSIS — I951 Orthostatic hypotension: Secondary | ICD-10-CM | POA: Diagnosis not present

## 2024-01-25 DIAGNOSIS — D508 Other iron deficiency anemias: Secondary | ICD-10-CM | POA: Diagnosis not present

## 2024-01-25 DIAGNOSIS — Z3A36 36 weeks gestation of pregnancy: Secondary | ICD-10-CM | POA: Diagnosis not present

## 2024-01-25 DIAGNOSIS — O0993 Supervision of high risk pregnancy, unspecified, third trimester: Secondary | ICD-10-CM | POA: Diagnosis not present

## 2024-01-25 DIAGNOSIS — R42 Dizziness and giddiness: Secondary | ICD-10-CM | POA: Diagnosis present

## 2024-01-25 DIAGNOSIS — O35BXX0 Maternal care for other (suspected) fetal abnormality and damage, fetal cardiac anomalies, not applicable or unspecified: Secondary | ICD-10-CM | POA: Insufficient documentation

## 2024-01-25 LAB — URINALYSIS, ROUTINE W REFLEX MICROSCOPIC
Bilirubin Urine: NEGATIVE
Glucose, UA: NEGATIVE mg/dL
Hgb urine dipstick: NEGATIVE
Ketones, ur: 5 mg/dL — AB
Nitrite: NEGATIVE
Protein, ur: 30 mg/dL — AB
Specific Gravity, Urine: 1.024 (ref 1.005–1.030)
pH: 5 (ref 5.0–8.0)

## 2024-01-25 LAB — COMPREHENSIVE METABOLIC PANEL WITH GFR
ALT: 12 U/L (ref 0–44)
AST: 17 U/L (ref 15–41)
Albumin: 3.2 g/dL — ABNORMAL LOW (ref 3.5–5.0)
Alkaline Phosphatase: 174 U/L — ABNORMAL HIGH (ref 38–126)
Anion gap: 12 (ref 5–15)
BUN: <5 mg/dL — ABNORMAL LOW (ref 6–20)
CO2: 21 mmol/L — ABNORMAL LOW (ref 22–32)
Calcium: 9.3 mg/dL (ref 8.9–10.3)
Chloride: 105 mmol/L (ref 98–111)
Creatinine, Ser: 0.47 mg/dL (ref 0.44–1.00)
GFR, Estimated: >60 mL/min
Glucose, Bld: 78 mg/dL (ref 70–99)
Potassium: 3.8 mmol/L (ref 3.5–5.1)
Sodium: 138 mmol/L (ref 135–145)
Total Bilirubin: 0.3 mg/dL (ref 0.0–1.2)
Total Protein: 6.4 g/dL — ABNORMAL LOW (ref 6.5–8.1)

## 2024-01-25 LAB — CBC WITH DIFFERENTIAL/PLATELET
Abs Immature Granulocytes: 0.07 K/uL (ref 0.00–0.07)
Basophils Absolute: 0 K/uL (ref 0.0–0.1)
Basophils Relative: 0 %
Eosinophils Absolute: 0 K/uL (ref 0.0–0.5)
Eosinophils Relative: 1 %
HCT: 32.6 % — ABNORMAL LOW (ref 36.0–46.0)
Hemoglobin: 10.3 g/dL — ABNORMAL LOW (ref 12.0–15.0)
Immature Granulocytes: 1 %
Lymphocytes Relative: 26 %
Lymphs Abs: 2 K/uL (ref 0.7–4.0)
MCH: 25.1 pg — ABNORMAL LOW (ref 26.0–34.0)
MCHC: 31.6 g/dL (ref 30.0–36.0)
MCV: 79.3 fL — ABNORMAL LOW (ref 80.0–100.0)
Monocytes Absolute: 0.6 K/uL (ref 0.1–1.0)
Monocytes Relative: 8 %
Neutro Abs: 5 K/uL (ref 1.7–7.7)
Neutrophils Relative %: 64 %
Platelets: 287 K/uL (ref 150–400)
RBC: 4.11 MIL/uL (ref 3.87–5.11)
RDW: 15.6 % — ABNORMAL HIGH (ref 11.5–15.5)
WBC: 7.7 K/uL (ref 4.0–10.5)
nRBC: 0 % (ref 0.0–0.2)

## 2024-01-25 LAB — PROTEIN / CREATININE RATIO, URINE
Creatinine, Urine: 214 mg/dL
Protein Creatinine Ratio: 0.1 mg/mg
Total Protein, Urine: 27 mg/dL

## 2024-01-25 LAB — TSH: TSH: 1.63 u[IU]/mL (ref 0.350–4.500)

## 2024-01-25 LAB — TROPONIN T, HIGH SENSITIVITY: Troponin T High Sensitivity: <6 ng/L (ref 0–19)

## 2024-01-25 MED ORDER — FERROUS SULFATE 325 (65 FE) MG PO TABS: ORAL_TABLET | ORAL | 11 refills | Status: AC

## 2024-01-25 MED ORDER — PROPRANOLOL HCL 20 MG PO TABS: 10.0000 mg | ORAL_TABLET | Freq: Two times a day (BID) | ORAL | 3 refills | Status: DC

## 2024-01-25 NOTE — Telephone Encounter (Signed)
 Patient would like a call back to discuss the possibility of getting a letter regarding her tachycardia and dizziness. He's job is asking for him to go out of town so she needs this letter for his job - so that he can stay and care for her. Please follow up with patient to further discuss.

## 2024-01-25 NOTE — MAU Provider Note (Signed)
 Obstetric Attending MAU Note  Chief Complaint:  Dizziness and Tachycardia   Event Date/Time   First Provider Initiated Contact with Patient 01/25/24 1814     HPI: Emily Golden is a 24 y.o. [id number] at [redacted]w[redacted]d who presents to maternity admissions reporting dizziness with associated tachycardia with HR > 170. Worse this pregnancy. Seen by Cardio/OB last pregnancy with Zio and normal ECHO. On propranolol  10 mg daily, not helping. No real dizziness outside of this. Denies contractions, leakage of fluid or vaginal bleeding. Good fetal movement.   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 10/07/2022   Obesity in pregnancy 07/11/2022   Irritable bowel syndrome 05/26/2022   Grief 03/07/2022   Bipolar 2 disorder (HCC) 07/02/2021   GAD (generalized anxiety disorder) 07/02/2021   ADHD 07/02/2021   Tobacco use disorder 07/02/2021   Alcohol consumption binge drinking 07/02/2021   Suicide attempt by drug ingestion (HCC) 06/09/2018   PTSD (post-traumatic stress disorder)     Past Medical History:  Diagnosis Date   Allergy    Anxiety    Asthma    Bipolar 2 disorder (HCC)    Gallstones    PTSD (post-traumatic stress disorder)    SVT (supraventricular tachycardia)     OB History  Gravida Para Term Preterm AB Living  6 1 1  0 4 1  SAB IAB Ectopic Multiple Live Births  4 0 0  1    # Outcome Date GA Lbr Len/2nd Weight Sex Type  Anes PTL Lv  6 Current           5 Term 12/20/22 [redacted]w[redacted]d 01:11 / 00:25 3340 g M Vag-Spont EPI  LIV  4 SAB 10/04/21 [redacted]w[redacted]d   U         Birth Comments: SAB managed with cytotec   3 SAB 2022          2 SAB         ND  1 SAB             Past Surgical History:  Procedure Laterality Date   CHOLECYSTECTOMY     compound fracture right arm Right    FRACTURE SURGERY     NASAL RECONSTRUCTION      Family History: Family History  Problem Relation Age of Onset   Diabetes Maternal Grandmother    Hypertension Maternal Grandmother    Hyperlipidemia Maternal Grandmother    Heart disease Maternal Grandfather    Cancer Maternal Grandfather    Diabetes Maternal Grandfather    Heart failure Neg Hx     Social History: Social History[1]  Allergies: Allergies[2]  Medications Prior to Admission  Medication Sig Dispense Refill Last Dose/Taking   acetaminophen  (TYLENOL ) 500 MG tablet Take 2 tablets (1,000 mg total) by mouth every 6 (six) hours as needed for mild pain.   Past Week   ALBUTEROL  IN Inhale 1-2 puffs into the lungs as needed.   Past Month   ARIPiprazole  (ABILIFY ) 5 MG tablet Patient to take  Abilify  5 mg daily for 6 days then continue taking 10 mg daily. 6 tablet 0 01/25/2024   busPIRone  (BUSPAR ) 7.5 MG tablet Patient to take buspirone  7.5 mg 2 times daily for 6 days then continue taking 10 mg 2 times daily. 12 tablet 0 01/25/2024   ondansetron  (ZOFRAN -ODT) 4 MG disintegrating tablet Take 1-2 tablets (4-8 mg total) by mouth every 8 (eight) hours as needed for nausea or vomiting. 20 tablet 2 Past Week   Prenatal Vit-Fe Fumarate-FA (MULTIVITAMIN-PRENATAL) 27-0.8 MG TABS tablet Take 1 tablet by mouth daily at 12 noon.   01/24/2024   [DISCONTINUED] propranolol  (INDERAL ) 10 MG tablet Take 1 tablet (10 mg total) by mouth at bedtime. 90 tablet 3 01/25/2024   ARIPiprazole  (ABILIFY ) 10 MG tablet Take 1 tablet (10 mg total) by mouth daily. (Patient not taking: Reported on 01/25/2024) 30 tablet 1 Not Taking    beclomethasone (QVAR ) 80 MCG/ACT inhaler Inhale 2 puffs into the lungs daily as needed (for shortenss of breath).      busPIRone  (BUSPAR ) 10 MG tablet Take 1 tablet (10 mg total) by mouth 2 (two) times daily. (Patient not taking: Reported on 01/25/2024) 60 tablet 1 Not Taking   cyclobenzaprine  (FLEXERIL ) 5 MG tablet Take 1 tablet (5 mg total) by mouth 3 (three) times daily as needed for muscle spasms. (Patient not taking: Reported on 01/22/2024) 20 tablet 0    naloxone (NARCAN) nasal spray 4 mg/0.1 mL Place 1 spray into the nose once. (Patient not taking: Reported on 12/15/2023)      [DISCONTINUED] ferrous sulfate  325 (65 FE) MG tablet Take 1 tablet (325 mg total) by mouth with breakfast on Monday, Wednesday, and Friday. 30 tablet 11     ROS: Pertinent findings in history of present illness.  Physical Exam  Blood pressure 97/63, pulse (!) 157, temperature 97.9 F (36.6 C), temperature source Oral, resp. rate 16, height 5' 7 (1.702 m), weight (!) 146.7 kg, last menstrual period 05/15/2023, SpO2 100%, not currently breastfeeding. CONSTITUTIONAL: Well-developed, well-nourished female in no acute distress.  HENT:  Normocephalic, atraumatic EYES: Conjunctivae and EOM are normal. No scleral icterus.  NECK: Normal range of motion, supple, no masses SKIN: Skin is warm and dry. No rash noted. Not diaphoretic. No erythema. No pallor. NEUROLGIC: Alert and oriented to person, place, and time.  CARDIOVASCULAR: Normal heart rate noted, regular rhythm RESPIRATORY: Effort normal, no problems with respiration noted ABDOMEN: Soft, nontender, nondistended, gravid appropriate for gestational age MUSCULOSKELETAL: Normal range of motion. No edema and no tenderness. 2+ distal pulses.  FHT:  Baseline 120 , moderate variability, accelerations present, no decelerations Contractions: quiet   Labs: Results for orders placed or performed during the hospital encounter of 01/25/24 (from the past 24 hours)  Urinalysis,  Routine w reflex microscopic -Urine, Clean Catch     Status: Abnormal   Collection Time: 01/25/24  6:03 PM  Result Value Ref Range   Color, Urine AMBER (A) YELLOW   APPearance CLOUDY (A) CLEAR   Specific Gravity, Urine 1.024 1.005 - 1.030   pH 5.0 5.0 - 8.0   Glucose, UA NEGATIVE NEGATIVE mg/dL   Hgb urine dipstick NEGATIVE NEGATIVE   Bilirubin Urine NEGATIVE NEGATIVE   Ketones, ur 5 (A) NEGATIVE mg/dL   Protein, ur 30 (A) NEGATIVE mg/dL   Nitrite NEGATIVE NEGATIVE   Leukocytes,Ua MODERATE (A) NEGATIVE   RBC / HPF 0-5 0 - 5 RBC/hpf   WBC, UA 11-20 0 - 5 WBC/hpf   Bacteria, UA FEW (A) NONE  SEEN   Squamous Epithelial / HPF 11-20 0 - 5 /HPF   Mucus PRESENT   Protein / creatinine ratio, urine     Status: None   Collection Time: 01/25/24  6:03 PM  Result Value Ref Range   Creatinine, Urine 214 mg/dL   Total Protein, Urine 27 mg/dL   Protein Creatinine Ratio 0.1 <0.2 mg/mg  Comprehensive metabolic panel     Status: Abnormal   Collection Time: 01/25/24  7:21 PM  Result Value Ref Range   Sodium 138 135 - 145 mmol/L   Potassium 3.8 3.5 - 5.1 mmol/L   Chloride 105 98 - 111 mmol/L   CO2 21 (L) 22 - 32 mmol/L   Glucose, Bld 78 70 - 99 mg/dL   BUN <5 (L) 6 - 20 mg/dL   Creatinine, Ser 9.52 0.44 - 1.00 mg/dL   Calcium 9.3 8.9 - 89.6 mg/dL   Total Protein 6.4 (L) 6.5 - 8.1 g/dL   Albumin 3.2 (L) 3.5 - 5.0 g/dL   AST 17 15 - 41 U/L   ALT 12 0 - 44 U/L   Alkaline Phosphatase 174 (H) 38 - 126 U/L   Total Bilirubin 0.3 0.0 - 1.2 mg/dL   GFR, Estimated >39 >39 mL/min   Anion gap 12 5 - 15  CBC with Differential/Platelet     Status: Abnormal   Collection Time: 01/25/24  7:21 PM  Result Value Ref Range   WBC 7.7 4.0 - 10.5 K/uL   RBC 4.11 3.87 - 5.11 MIL/uL   Hemoglobin 10.3 (L) 12.0 - 15.0 g/dL   HCT 67.3 (L) 63.9 - 53.9 %   MCV 79.3 (L) 80.0 - 100.0 fL   MCH 25.1 (L) 26.0 - 34.0 pg   MCHC 31.6 30.0 - 36.0 g/dL   RDW 84.3 (H) 88.4 - 84.4 %   Platelets 287 150 - 400 K/uL   nRBC 0.0  0.0 - 0.2 %   Neutrophils Relative % 64 %   Neutro Abs 5.0 1.7 - 7.7 K/uL   Lymphocytes Relative 26 %   Lymphs Abs 2.0 0.7 - 4.0 K/uL   Monocytes Relative 8 %   Monocytes Absolute 0.6 0.1 - 1.0 K/uL   Eosinophils Relative 1 %   Eosinophils Absolute 0.0 0.0 - 0.5 K/uL   Basophils Relative 0 %   Basophils Absolute 0.0 0.0 - 0.1 K/uL   Immature Granulocytes 1 %   Abs Immature Granulocytes 0.07 0.00 - 0.07 K/uL  TSH     Status: None   Collection Time: 01/25/24  7:21 PM  Result Value Ref Range   TSH 1.630 0.350 - 4.500 uIU/mL  Troponin T, High Sensitivity     Status: None   Collection Time: 01/25/24  7:21 PM  Result Value Ref Range   Troponin T High Sensitivity <6 0 - 19 ng/L    EKG:  Sinus rate 100, flipped t waves laterally  MAU Course: Normal labs Orthostatic VS for the past 24 hrs:  BP- Lying Pulse- Lying BP- Sitting Pulse- Sitting BP- Standing at 0 minutes Pulse- Standing at 0 minutes  01/25/24 1841 -- -- -- -- 125/89 125  01/25/24 1839 -- -- 133/81 96 -- --  01/25/24 1838 127/82 97 -- -- -- --       Assessment: 1. 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 Supervision of high risk pregnancy in third trimester   3. [redacted] weeks gestation of pregnancy   4. Sinus tachycardia  5. Orthostasis   6. Other iron deficiency anemia     Plan: Increase Propranolol  to 20 mg bid Emphasized iron Treatment options for SVT F/u with Cardio/OB Sit before stand, stand before walking Labor precautions and fetal kick counts reviewed Follow up with OB provider for scheduled prenatal visits. She was discharged to home in stable condition with return precautions.   Allergies as of 01/25/2024       Reactions   Other Shortness Of Breath   Dust, Reports smells,perfumes,aerosols HX asthma   Latex Rash        Medication List     TAKE these medications    acetaminophen  500 MG tablet Commonly known as: TYLENOL  Take 2 tablets (1,000 mg total) by  mouth every 6 (six) hours as needed for mild pain.   ALBUTEROL  IN Inhale 1-2 puffs into the lungs as needed.   ARIPiprazole  10 MG tablet Commonly known as: Abilify  Take 1 tablet (10 mg total) by mouth daily.   ARIPiprazole  5 MG tablet Commonly known as: Abilify  Patient to take Abilify  5 mg daily for 6 days then continue taking 10 mg daily.   beclomethasone 80 MCG/ACT inhaler Commonly known as: QVAR  Inhale 2 puffs into the lungs daily as needed (for shortenss of breath).   busPIRone  10 MG tablet Commonly known as: BUSPAR  Take 1 tablet (10 mg total) by mouth 2 (two) times daily.   busPIRone  7.5 MG tablet Commonly known as: BUSPAR  Patient to take buspirone  7.5 mg 2 times daily for 6 days then continue taking 10 mg 2 times daily.   cyclobenzaprine  5 MG tablet Commonly known as: FLEXERIL  Take 1 tablet (5 mg total) by mouth 3 (three) times daily as needed for muscle spasms.   ferrous sulfate  325 (65 FE) MG tablet Take 1 tablet (325 mg total) by mouth with breakfast on Monday, Wednesday, and Friday.   multivitamin-prenatal 27-0.8 MG Tabs tablet Take 1 tablet by mouth daily at 12 noon.   naloxone 4 MG/0.1ML Liqd nasal spray kit Commonly known as: NARCAN Place 1 spray into the nose once.   ondansetron  4 MG disintegrating tablet Commonly known as: ZOFRAN -ODT Take 1-2 tablets (4-8 mg total) by mouth every 8 (eight) hours as needed for nausea or vomiting.   propranolol  20 MG tablet Commonly known as: INDERAL  Take 0.5 tablets (10 mg total) by mouth 2 (two) times daily. What changed:  medication strength when to take this        Fredirick Glenys RAMAN, MD 01/25/2024 8:47 PM     [1]  Social History Tobacco Use   Smoking status: Never    Passive exposure: Never   Smokeless tobacco: Never   Tobacco comments:    Stopped smoking 3 months before she got pregnant  Vaping Use   Vaping status: Former   Substances: Nicotine   Substance Use Topics   Alcohol use: Not Currently    Drug use: Never  [2]  Allergies Allergen Reactions   Other Shortness Of Breath    Dust, Reports smells,perfumes,aerosols HX asthma    Latex Rash

## 2024-01-25 NOTE — MAU Note (Signed)
 Emily Golden is a 24 y.o. at [redacted]w[redacted]d here in MAU reporting: dizziness and tachycardia - same as other times- HR reported as high as 170s- She reports +FMs, Denies LOF, VB, regular contractions, blurry vision, headaches, peripheral edema, or RUQ pain.   LMP: na Onset of complaint: ongoing Pain score: 4/10 Vitals:   01/25/24 1757  BP: 131/77  Pulse: (!) 115  Resp: 16  Temp: 97.9 F (36.6 C)  SpO2: 100%     FHT: 159  Lab orders placed from triage: na

## 2024-01-25 NOTE — MAU Note (Signed)
 RN called main lab to inquire about status of labs sent and if they had been received. Lab tech informed RN to give them a couple of minutes and then they would receive the labs in.

## 2024-01-25 NOTE — Telephone Encounter (Signed)
 Pt had also sent Mychart message with this request. I placed the requested letter in her Mychart and sent a message to her with the information.

## 2024-01-26 ENCOUNTER — Encounter (HOSPITAL_COMMUNITY): Payer: Self-pay | Admitting: Physician Assistant

## 2024-01-26 ENCOUNTER — Other Ambulatory Visit: Payer: Self-pay | Admitting: *Deleted

## 2024-01-26 ENCOUNTER — Ambulatory Visit: Payer: MEDICAID | Attending: Obstetrics and Gynecology

## 2024-01-26 DIAGNOSIS — O09299 Supervision of pregnancy with other poor reproductive or obstetric history, unspecified trimester: Secondary | ICD-10-CM | POA: Diagnosis not present

## 2024-01-26 DIAGNOSIS — O99213 Obesity complicating pregnancy, third trimester: Secondary | ICD-10-CM

## 2024-01-26 DIAGNOSIS — O99210 Obesity complicating pregnancy, unspecified trimester: Secondary | ICD-10-CM

## 2024-01-26 DIAGNOSIS — Z3A36 36 weeks gestation of pregnancy: Secondary | ICD-10-CM

## 2024-01-26 NOTE — Procedures (Signed)
 Emily Golden 29-Mar-2000 [redacted]w[redacted]d  Fetus A Non-Stress Test Interpretation for 01/26/24  Indication: Maternal Obesity - NST only  Fetal Heart Rate A Mode: External Baseline Rate (A): 130 bpm Variability: Moderate Accelerations: 15 x 15 Decelerations: None Multiple birth?: No  Uterine Activity Mode: Palpation, Toco Contraction Frequency (min): none noted Resting Tone Palpated: Relaxed  Interpretation (Fetal Testing) Nonstress Test Interpretation: Reactive Comments: Reviewed with Dr. Ileana

## 2024-01-26 NOTE — Progress Notes (Signed)
 MFM Consult Note  Emily Golden is currently at [redacted]w[redacted]d. She has been followed due to maternal obesity with a BMI of 50.65.    She  reports feeling fetal movements throughout the day.  She was evaluated in the MAU yesterday due to dizziness and tachycardia.  A plan was made to increase her dosage of propranolol .  She is scheduled to see the cardio obstetrics team next week.  She had a reactive NST today.    A modified BPP performed today showed normal amniotic fluid with a total AFI of 12.45 cm.    The fetus was in the vertex presentation.    Fetal movements and fetal breathing movements were noted throughout today's exam.  Due to maternal obesity, she will return in 1 week for another NST.    Delivery should be considered at around 39 weeks.  The patient stated that all of her questions were answered.   A total of 20 minutes was spent counseling and coordinating the care for this patient.  Greater than 50% of the time was spent in direct face-to-face contact.

## 2024-01-26 NOTE — Addendum Note (Signed)
 Addended by: Kushal Saunders, VICTOR on: 01/26/2024 04:16 PM   Modules accepted: Level of Service

## 2024-01-26 NOTE — Progress Notes (Signed)
 BH MD/PA/NP OP Progress Note  Virtual Visit via Video Note  I connected with Acadia Pell on 01/23/24 at  5:30 PM EST by a video enabled telemedicine application and verified that I am speaking with the correct person using two identifiers.  Location: Patient: Home Provider: Clinic   I discussed the limitations of evaluation and management by telemedicine and the availability of in person appointments. The patient expressed understanding and agreed to proceed.  Follow Up Instructions:  I discussed the assessment and treatment plan with the patient. The patient was provided an opportunity to ask questions and all were answered. The patient agreed with the plan and demonstrated an understanding of the instructions.   The patient was advised to call back or seek an in-person evaluation if the symptoms worsen or if the condition fails to improve as anticipated.  I provided 18 minutes of non-face-to-face time during this encounter.  Reginia FORBES Bolster, PA    01/23/2024 5:30 PM Teairra Millar  MRN:  [id number]  Chief Complaint:  Chief Complaint  Patient presents with   Follow-up   Medication Management   HPI:   Emily Golden is a 24 year old female with a past psychiatric history significant for generalized anxiety disorder and bipolar 2 disorder who presents to [facility] via virtual video visit for follow-up and medication management.  Patient was last seen by this provider on 09/27/2023.  During her last encounter, patient was being managed on the following psychiatric medications:  Abilify  5 mg daily Buspirone  7.5 mg 2 times daily  Patient reports that she ran out of her prescriptions 3 weeks ago.  When she was taking her medications, patient reports that she was tolerating them and was not experiencing any major side effects besides some drowsiness in the very beginning of taking them.  Patient would like to restart her medications.  Patient  reports that she has not been great since running out of her prescriptions.  Patient rates her depression an 8 out of 10 with 10 being most severe.  Patient endorses depressive episodes every day.  Patient endorses the following depressive symptoms: feelings of sadness, crying spells, lack of motivation, decreased concentration, decreased energy, irritability, feelings of guilt/worthlessness, and hopelessness.  Patient also reports that her anxiety has been elevated.  Patient rates her anxiety a 7 out of 10 with stressors related to her heart health.  A PHQ-9 screen was performed with the patient scoring an 18.  A GAD-7 screen was also performed with the patient scoring a 20.  Patient is alert and oriented x 4, calm, cooperative, and fully engaged in conversation during the encounter.  Patient describes her mood as blah and neutral.  Patient exhibits depressed mood with congruent affect.  Patient denies suicidal or homicidal ideations.  Patient further denies auditory or visual hallucinations and does not appear to be responding to internal/external stimuli.  Patient endorses poor sleep and receives on average 4 hours of sleep per night.  Patient endorses decreased appetite and eats on average 1 meal per day.  Patient denies alcohol consumption, tobacco use, or illicit drug use.  Visit Diagnosis:    ICD-10-CM   1. GAD (generalized anxiety disorder)  F41.1 busPIRone  (BUSPAR ) 10 MG tablet    busPIRone  (BUSPAR ) 7.5 MG tablet    2. Bipolar 2 disorder (HCC)  F31.81 ARIPiprazole  (ABILIFY ) 10 MG tablet    ARIPiprazole  (ABILIFY ) 5 MG tablet      Past Psychiatric History:  Patient has a past psychiatric  history significant for anxiety, attention deficit hyperactivity disorder, bipolar 2 disorder, and PTSD.   Patient has a past history of hospitalization due to mental health.  Patient reports history of last hospitalized at [facility] in 2023 due to suicidal ideations.   Patient  endorses a past history of suicide attempt with her last attempt occurring in 2020 due to drug overdose.   Patient denies a past history of homicide attempts.  Past Medical History:  Past Medical History:  Diagnosis Date   Allergy    Anxiety    Asthma    Bipolar 2 disorder (HCC)    Gallstones    PTSD (post-traumatic stress disorder)    SVT (supraventricular tachycardia)     Past Surgical History:  Procedure Laterality Date   CHOLECYSTECTOMY     compound fracture right arm Right    FRACTURE SURGERY     NASAL RECONSTRUCTION      Family Psychiatric History:  Biological father - bipolar disorder, suicidal tendency Mother - anxiety, depression Grandmother (maternal) - anxiety, depression Brother - bipolar disorder   Family history of suicide attempt: Patient reports that her father has attempted suicide in the past Family history of homicide attempt: Patient reports that people on her father side of the family have gone to jail for attempted murder for drug use. Family history of substance abuse: Patient reports that her father and brother both abused alcohol.  Family History:  Family History  Problem Relation Age of Onset   Diabetes Maternal Grandmother    Hypertension Maternal Grandmother    Hyperlipidemia Maternal Grandmother    Heart disease Maternal Grandfather    Cancer Maternal Grandfather    Diabetes Maternal Grandfather    Heart failure Neg Hx     Social History:  Social History   Socioeconomic History   Marital status: Married    Spouse name: Not on file   Number of children: Not on file   Years of education: Not on file   Highest education level: Not on file  Occupational History   Not on file  Tobacco Use   Smoking status: Never    Passive exposure: Never   Smokeless tobacco: Never   Tobacco comments:    Stopped smoking 3 months before she got pregnant  Vaping Use   Vaping status: Former   Substances: Nicotine   Substance and Sexual Activity    Alcohol use: Not Currently   Drug use: Never   Sexual activity: Yes  Other Topics Concern   Not on file  Social History Narrative   Is in 10th grade at [employer]   Social Drivers of Health   Tobacco Use: Low Risk (01/26/2024)   Patient History    Smoking Tobacco Use: Never    Smokeless Tobacco Use: Never    Passive Exposure: Never  Recent Concern: Tobacco Use - Medium Risk (12/04/2023)   Patient History    Smoking Tobacco Use: Former    Smokeless Tobacco Use: Never    Passive Exposure: Past  Physicist, Medical Strain: Not on file  Food Insecurity: No Food Insecurity (11/29/2023)   Epic    Worried About Programme Researcher, Broadcasting/film/video in the Last Year: Never true    [employer] of Food in the Last Year: Never true  Recent Concern: Food Insecurity - Food Insecurity Present (10/09/2023)   Epic    Worried About Programme Researcher, Broadcasting/film/video in the Last Year: Sometimes true    Barista in  the Last Year: Sometimes true  Transportation Needs: No Transportation Needs (11/29/2023)   Epic    Lack of Transportation (Medical): No    Lack of Transportation (Non-Medical): No  Physical Activity: Not on file  Stress: Not on file  Social Connections: Not on file  Depression (EYV7-0): High Risk (01/23/2024)   Depression (PHQ2-9)    PHQ-2 Score: 18  Alcohol Screen: Medium Risk (07/01/2021)   Alcohol Screen    Last Alcohol Screening Score (AUDIT): 14  Housing: Low Risk (11/02/2023)   Received from [facility]   Epic    What is your living situation today?: I have a steady place to live    Think about the place you live. Do you have problems with any of the following? Choose all that apply:: None/None on this list  Recent Concern: Housing - High Risk (10/09/2023)   Epic    Unable to Pay for Housing in the Last Year: Yes    Number of Times Moved in the Last Year: 0    Homeless in the Last Year: No  Utilities: Low Risk (11/02/2023)   Received from [facility]   Utilities    In the  past 12 months has the electric, gas, oil, or water company threatened to shut off services in your home? : No  Health Literacy: Not on file    Allergies: Allergies[1]  Metabolic Disorder Labs: Lab Results  Component Value Date   HGBA1C 4.8 07/01/2021   MPG 91 07/01/2021   MPG 88.19 06/11/2018   Lab Results  Component Value Date   PROLACTIN 8.2 07/01/2021   PROLACTIN 30.7 (H) 06/11/2018   Lab Results  Component Value Date   CHOL 159 07/01/2021   TRIG 76 07/01/2021   HDL 42 07/01/2021   CHOLHDL 3.8 07/01/2021   VLDL 15 07/01/2021   LDLCALC 102 (H) 07/01/2021   LDLCALC 94 06/11/2018   Lab Results  Component Value Date   TSH 1.630 01/25/2024   TSH 1.093 07/01/2021    Therapeutic Level Labs: No results found for: LITHIUM No results found for: VALPROATE No results found for: CBMZ  Current Medications: Current Outpatient Medications  Medication Sig Dispense Refill   ARIPiprazole  (ABILIFY ) 5 MG tablet Patient to take Abilify  5 mg daily for 6 days then continue taking 10 mg daily. 6 tablet 0   busPIRone  (BUSPAR ) 7.5 MG tablet Patient to take buspirone  7.5 mg 2 times daily for 6 days then continue taking 10 mg 2 times daily. 12 tablet 0   acetaminophen  (TYLENOL ) 500 MG tablet Take 2 tablets (1,000 mg total) by mouth every 6 (six) hours as needed for mild pain.     ALBUTEROL  IN Inhale 1-2 puffs into the lungs as needed.     ARIPiprazole  (ABILIFY ) 10 MG tablet Take 1 tablet (10 mg total) by mouth daily. (Patient not taking: Reported on 01/25/2024) 30 tablet 1   beclomethasone (QVAR ) 80 MCG/ACT inhaler Inhale 2 puffs into the lungs daily as needed (for shortenss of breath).     busPIRone  (BUSPAR ) 10 MG tablet Take 1 tablet (10 mg total) by mouth 2 (two) times daily. (Patient not taking: Reported on 01/25/2024) 60 tablet 1   cyclobenzaprine  (FLEXERIL ) 5 MG tablet Take 1 tablet (5 mg total) by mouth 3 (three) times daily as needed for muscle spasms. (Patient not taking:  Reported on 01/22/2024) 20 tablet 0   ferrous sulfate  325 (65 FE) MG tablet Take 1 tablet (325 mg total) by mouth with breakfast on Monday,  Wednesday, and Friday. 30 tablet 11   naloxone (NARCAN) nasal spray 4 mg/0.1 mL Place 1 spray into the nose once. (Patient not taking: Reported on 12/15/2023)     ondansetron  (ZOFRAN -ODT) 4 MG disintegrating tablet Take 1-2 tablets (4-8 mg total) by mouth every 8 (eight) hours as needed for nausea or vomiting. 20 tablet 2   Prenatal Vit-Fe Fumarate-FA (MULTIVITAMIN-PRENATAL) 27-0.8 MG TABS tablet Take 1 tablet by mouth daily at 12 noon.     propranolol  (INDERAL ) 20 MG tablet Take 0.5 tablets (10 mg total) by mouth 2 (two) times daily. 60 tablet 3   No current facility-administered medications for this visit.     Musculoskeletal: Strength & Muscle Tone: within normal limits Gait & Station: normal Patient leans: N/A  Psychiatric Specialty Exam: Review of Systems  Psychiatric/Behavioral:  Positive for dysphoric mood and sleep disturbance. Negative for decreased concentration, hallucinations, self-injury and suicidal ideas. The patient is nervous/anxious. The patient is not hyperactive.     Last menstrual period 05/15/2023, not currently breastfeeding.There is no height or weight on file to calculate BMI.  General Appearance: Casual  Eye Contact:  Good  Speech:  Clear and Coherent and Normal Rate  Volume:  Normal  Mood:  Anxious and Depressed  Affect:  Congruent  Thought Process:  Coherent, Goal Directed, and Descriptions of Associations: Intact  Orientation:  Full (Time, Place, and Person)  Thought Content: WDL   Suicidal Thoughts:  No  Homicidal Thoughts:  No  Memory:  Immediate;   Good Recent;   Good Remote;   Good  Judgement:  Good  Insight:  Good  Psychomotor Activity:  Normal  Concentration:  Concentration: Good and Attention Span: Good  Recall:  Good  Fund of Knowledge: Good  Language: Good  Akathisia:  No  Handed:  Right  AIMS (if  indicated): done; 0  Assets:  Communication Skills Desire for Improvement Financial Resources/Insurance Housing Physical Health Social Support Transportation  ADL's:  Intact  Cognition: WNL  Sleep:  Poor   Screenings: AIMS    Flowsheet Row Video Visit from [facility]/23/2024 in [facility]) [facility]) [facility] Ad[facility] (Discharged) from [facility]  AIMS Total Score 0 0 0 0   AUDIT    Flowsheet Row Admission (Discharged) [facility]  Alcohol Use Disorder Identification Test Final Score (AUDIT) 14   GAD-7    Flowsheet Row Video Visit from 01/23/2024 in [facility] Initial Prenatal from 11/29/2023 in [facility] Office Visit from 09/27/2023 in [facility] Office Visit from 10/08/2021 in [place] [facility] Office Visit from 09/22/2021 in [place] [facility]  Total GAD-7 Score 20 12 21 12 19    PHQ2-9    Flowsheet Row Video Visit from 01/23/2024 in [facility] Initial Prenatal from 11/29/2023 in [facility] Office Visit from 09/27/2023 in [facility] Office Visit from 10/08/2021 in [place] [facility] Office Visit from 09/22/2021 in [place] [facility] at [facility]  PHQ-2 Total Score 5 2 4 4 3   PHQ-9 Total Score 18 3 10  11 13   Flowsheet Row Video Visit from 01/23/2024 in [facility] Admission (Discharged) from 12/24/2023 in [place] 1S Maternity Assessment Unit Admission  (Discharged) from 12/14/2023 in [facility] 1S Maternity Assessment Unit  C-SSRS RISK CATEGORY Moderate Risk No Risk No Risk     Assessment and Plan:   Cashay Manganelli is a 24 year old female with a past psychiatric history significant for generalized anxiety disorder and bipolar 2 disorder who presents to [facility] via virtual video visit for follow-up and medication management.  Patient was last seen by this provider on 09/27/2023.  During her last encounter, patient was being managed on the following psychiatric medications:  Abilify  5 mg daily Buspirone  7.5 mg 2 times daily  Patient reports that she recently ran out of her prescriptions 3 weeks ago but is interested in restarting her medications.  Since running out of her prescriptions, patient reports that she has been experiencing worsening depression and elevated anxiety.  A PHQ-9 screen was performed with the patient scoring an 18.  A GAD-7 screen was also performed with the patient scoring of 20.  In addition to restarting her medications, patient reports that she would like to increase the dosages of her prescriptions.  Provider recommended patient take Abilify  5 mg for 6 days, followed by 10 mg daily for mood stability.  Provider also recommended patient buspirone  7.5 mg 2 times daily for 6 days followed by 10 mg 2 times daily for the management of her anxiety.  Patient was agreeable to recommendations.  Patient's medications to be e-prescribed to pharmacy of choice.  An aims assessment was performed with the patient scoring of 0.  A [place] Suicide Severity Rating Scale was performed with the patient being considered no risk.  Patient denies suicidal ideations and is able to contract for safety at this time.    Collaboration of Care: Collaboration of Care: Medication Management AEB provider managing patient's psychiatric medications, Psychiatrist AEB patient being followed by mental health provider  patient is fully, and Other provider involved in patient's care AEB patient being seen by OB/GYN  Patient/Guardian was advised Release of Information must be obtained prior to any record release in order to collaborate their care with an outside provider. Patient/Guardian was advised if they have not already done so to contact the registration department to sign all necessary forms in order for us  to release information regarding their care.   Consent: Patient/Guardian gives verbal consent for treatment and assignment of benefits for services provided during this visit. Patient/Guardian expressed understanding and agreed to proceed.   1. GAD (generalized anxiety disorder)  - busPIRone  (BUSPAR ) 10 MG tablet; Take 1 tablet (10 mg total) by mouth 2 (two) times daily. (Patient not taking: Reported on 01/25/2024)  Dispense: 60 tablet; Refill: 1 - busPIRone  (BUSPAR ) 7.5 MG tablet; Patient to take buspirone  7.5 mg 2 times daily for 6 days then continue taking 10 mg 2 times daily.  Dispense: 12 tablet; Refill: 0  2. Bipolar 2 disorder (HCC)  - ARIPiprazole  (ABILIFY ) 10 MG tablet; Take 1 tablet (10 mg total) by mouth daily. (Patient not taking: Reported on 01/25/2024)  Dispense: 30 tablet; Refill: 1 - ARIPiprazole  (ABILIFY ) 5 MG tablet; Patient to take Abilify  5 mg daily for 6 days then continue taking 10 mg daily.  Dispense: 6 tablet; Refill: 0  Patient to follow-up in 6 weeks Provider spent a total of 18 minutes with the patient/reviewing patient's chart  Reginia FORBES Bolster, PA 01/26/2024,  6:51 PM     [1]  Allergies Allergen Reactions   Other Shortness Of Breath    Dust, Reports smells,perfumes,aerosols HX asthma    Latex Rash

## 2024-01-27 LAB — CULTURE, BETA STREP (GROUP B ONLY): Strep Gp B Culture: NEGATIVE

## 2024-01-29 ENCOUNTER — Encounter: Payer: MEDICAID | Admitting: Family Medicine

## 2024-01-30 ENCOUNTER — Ambulatory Visit: Payer: MEDICAID

## 2024-01-30 ENCOUNTER — Other Ambulatory Visit: Payer: MEDICAID

## 2024-02-01 ENCOUNTER — Ambulatory Visit: Payer: Self-pay | Admitting: Obstetrics and Gynecology

## 2024-02-01 ENCOUNTER — Encounter: Payer: Self-pay | Admitting: Obstetrics and Gynecology

## 2024-02-01 ENCOUNTER — Other Ambulatory Visit: Payer: Self-pay

## 2024-02-01 VITALS — BP 110/75 | HR 104 | Wt 317.5 lb

## 2024-02-01 DIAGNOSIS — O3663X0 Maternal care for excessive fetal growth, third trimester, not applicable or unspecified: Secondary | ICD-10-CM

## 2024-02-01 DIAGNOSIS — O35BXX0 Maternal care for other (suspected) fetal abnormality and damage, fetal cardiac anomalies, not applicable or unspecified: Secondary | ICD-10-CM

## 2024-02-01 DIAGNOSIS — O09293 Supervision of pregnancy with other poor reproductive or obstetric history, third trimester: Secondary | ICD-10-CM | POA: Diagnosis not present

## 2024-02-01 DIAGNOSIS — Z3A37 37 weeks gestation of pregnancy: Secondary | ICD-10-CM | POA: Diagnosis not present

## 2024-02-01 DIAGNOSIS — Z6841 Body Mass Index (BMI) 40.0 and over, adult: Secondary | ICD-10-CM | POA: Diagnosis not present

## 2024-02-01 DIAGNOSIS — O3660X0 Maternal care for excessive fetal growth, unspecified trimester, not applicable or unspecified: Secondary | ICD-10-CM

## 2024-02-01 DIAGNOSIS — O99213 Obesity complicating pregnancy, third trimester: Secondary | ICD-10-CM | POA: Diagnosis not present

## 2024-02-01 DIAGNOSIS — O0993 Supervision of high risk pregnancy, unspecified, third trimester: Secondary | ICD-10-CM

## 2024-02-01 DIAGNOSIS — O09299 Supervision of pregnancy with other poor reproductive or obstetric history, unspecified trimester: Secondary | ICD-10-CM

## 2024-02-01 DIAGNOSIS — M7918 Myalgia, other site: Secondary | ICD-10-CM

## 2024-02-01 DIAGNOSIS — O99210 Obesity complicating pregnancy, unspecified trimester: Secondary | ICD-10-CM

## 2024-02-01 MED ORDER — CYCLOBENZAPRINE HCL 5 MG PO TABS: 5.0000 mg | ORAL_TABLET | Freq: Three times a day (TID) | ORAL | 0 refills | Status: AC | PRN

## 2024-02-01 NOTE — Progress Notes (Signed)
 "  PRENATAL VISIT NOTE  Subjective:  Emily Golden is a 24 y.o. H3E8958 at [redacted]w[redacted]d being seen today for ongoing prenatal care.  She is currently monitored for the following issues for this high-risk pregnancy and has PTSD (post-traumatic stress disorder); Suicide attempt by drug ingestion (HCC); Bipolar 2 disorder (HCC); GAD (generalized anxiety disorder); ADHD; Tobacco use disorder; Alcohol consumption binge drinking; Grief; Hx Fetal tetralogy of Fallot affecting antepartum care of mother; Irritable bowel syndrome; Maternal varicella, non-immune; Obesity in pregnancy; Family history of congenital heart defect; Pilonidal cyst; Supervision of high risk pregnancy in third trimester; Hx of preeclampsia, prior pregnancy, currently pregnant; Anemia during pregnancy; LGA (large for gestational age) fetus affecting mother, antepartum; BMI 45.0-49.9, adult (HCC); and Tachycardia, unspecified on their problem list.  Patient  continues to have palpitations and near syncope. Pt is to see OB/cards on 02/02/24.  Pt complains of increased pelvic pain and a popping sensation in the hips.  She is generally uncomfortable and is requesting delivery.  She reports pelvic pressure and palpitations.  Contractions: Irritability. Vag. Bleeding: None.  Movement: Present. Denies leaking of fluid.   The following portions of the patient's history were reviewed and updated as appropriate: allergies, current medications, past family history, past medical history, past social history, past surgical history and problem list. Problem list updated.  Objective:   Vitals:   02/01/24 1641  BP: 110/75  Pulse: (!) 104  Weight: (!) 317 lb 8 oz (144 kg)    Fetal Status: Fetal Heart Rate (bpm): 130 Fundal Height: 39 cm Movement: Present     General:  Alert, oriented and cooperative. Patient is in no acute distress.  Skin: Skin is warm and dry. No rash noted.   Cardiovascular: Normal heart rate noted  Respiratory: Normal respiratory  effort, no problems with respiration noted  Abdomen: Soft, gravid, appropriate for gestational age.  Pain/Pressure: Present (pelvis pressure/pain)     Pelvic: Cervical exam performed Dilation: Fingertip Effacement (%): 40 Station: -3  Extremities: Normal range of motion.  Edema: None  Mental Status:  Normal mood and affect. Normal behavior. Normal judgment and thought content.   Assessment and Plan:  Pregnancy: H3E8958 at [redacted]w[redacted]d  1. [redacted] weeks gestation of pregnancy (Primary)   2. Tetralogy of Fallot of fetus affecting management of mother, antepartum, single or unspecified fetus   3. Supervision of high risk pregnancy in third trimester Continue routine prenatal care Will schedule IOL at 39 weeks per MFM, may move up if cards suggests earlier delivery.  4. Obesity in pregnancy   5. Excessive fetal growth affecting pregnancy, antepartum, single or unspecified fetus EFW at 39 weeks 8 lbs 3 ounces, this is larger than previous delivery  6. Hx of preeclampsia, prior pregnancy, currently pregnant No s/sx of preeclampsia  7. BMI 45.0-49.9, adult (HCC)   8. Musculoskeletal pain Worsening MSK pain, flexeril  refilled.  Pt advised it is unlikely to improve until delivery and further evaluation can be done post delivery  - cyclobenzaprine  (FLEXERIL ) 5 MG tablet; Take 1 tablet (5 mg total) by mouth 3 (three) times daily as needed for muscle spasms.  Dispense: 20 tablet; Refill: 0  Term labor symptoms and general obstetric precautions including but not limited to vaginal bleeding, contractions, leaking of fluid and fetal movement were reviewed in detail with the patient.  Please refer to After Visit Summary for other counseling recommendations.   Return in about 1 week (around 02/08/2024) for [facility], in person.   Jerilynn Buddle, MD Faculty Attending Center  for [employer]   "

## 2024-02-02 ENCOUNTER — Encounter: Payer: Self-pay | Admitting: Cardiology

## 2024-02-02 ENCOUNTER — Ambulatory Visit: Payer: MEDICAID | Attending: Obstetrics and Gynecology | Admitting: *Deleted

## 2024-02-02 ENCOUNTER — Ambulatory Visit: Payer: MEDICAID | Admitting: Cardiology

## 2024-02-02 VITALS — BP 109/83 | HR 126 | Ht 67.5 in | Wt 320.0 lb

## 2024-02-02 DIAGNOSIS — O99213 Obesity complicating pregnancy, third trimester: Secondary | ICD-10-CM | POA: Diagnosis present

## 2024-02-02 DIAGNOSIS — Z3A37 37 weeks gestation of pregnancy: Secondary | ICD-10-CM | POA: Insufficient documentation

## 2024-02-02 DIAGNOSIS — O3663X0 Maternal care for excessive fetal growth, third trimester, not applicable or unspecified: Secondary | ICD-10-CM | POA: Diagnosis not present

## 2024-02-02 DIAGNOSIS — O99210 Obesity complicating pregnancy, unspecified trimester: Secondary | ICD-10-CM | POA: Diagnosis not present

## 2024-02-02 DIAGNOSIS — R55 Syncope and collapse: Secondary | ICD-10-CM

## 2024-02-02 MED ORDER — PROPRANOLOL HCL 10 MG PO TABS: 10.0000 mg | ORAL_TABLET | Freq: Three times a day (TID) | ORAL | 3 refills | Status: AC

## 2024-02-02 NOTE — Procedures (Signed)
 Emily Golden 2001-01-03 [redacted]w[redacted]d  Fetus A Non-Stress Test Interpretation for 02/02/24  Indication: LGA, preg obesity  Fetal Heart Rate A Mode: External Baseline Rate (A): 130 bpm Variability: Moderate Accelerations: 15 x 15 Decelerations: None Multiple birth?: No  Uterine Activity Mode: Toco Contraction Frequency (min): one isolated u/c durin NST Contraction Duration (sec): 50 Contraction Quality: Mild Resting Tone Palpated: Relaxed  Interpretation (Fetal Testing) Nonstress Test Interpretation: Reactive Comments: Tracing reviewed byDr. Arna

## 2024-02-02 NOTE — Patient Instructions (Addendum)
 Medication Instructions:  Your physician has recommended you make the following change in your medication:  INCREASE: Propranolol  10 mg every 8 hours  Lab Work: None ordered today   Testing/Procedures: None ordered today  Follow-Up: At [facility], you and your health needs are our priority.  As part of our continuing mission to provide you with exceptional heart care, our providers are all part of one team.  This team includes your primary Cardiologist (physician) and Advanced Practice Providers or APPs (Physician Assistants and Nurse Practitioners) who all work together to provide you with the care you need, when you need it.  Your next appointment:   10 weeks via MyChart  Provider:   Kardie Tobb, DO     Other Instructions:

## 2024-02-04 NOTE — Progress Notes (Signed)
 "  [facility]  New Evaluation  Date:  02/04/2024   ID:  Emily Golden, DOB 04/02/2000, MRN [id number]  PCP:  Barbra Odor, NP   [facility] Providers Cardiologist:  Dub Huntsman, DO  Electrophysiologist:  None       Referring MD: Regino Camie LABOR, CNM   Chief Complaint:  I am passing out again  History of Present Illness:    Emily Golden is a 24 y.o. female [G6P1041] who is being seen today for the evaluation of palpitations and syncope at the request of Regino Camie LABOR, CNM.   Medical hx includers bipolar disorder, PTSD, SVT, Asthma.   She is [redacted] weeks pregnant with recurrent episodes of palpitations and dizziness that began earlier in this pregnancy than in her prior one and are more severe. Her heart rate rises to the 140s or higher with some episodes associated with dizziness and loss of consciousness, occurring mainly during pregnancy and shortly postpartum.  She takes propranolol , which was started in her last pregnancy but her symptoms are not fully controlled.  She has marked activity intolerance. Her heart rate increases with minimal exertion such as walking to the bathroom or in a store, and she has been unable to stay active since her last pregnancy due to these symptoms.  She reports she is scheduled to be induced on February 9th.   Prior CV Studies Reviewed: The following studies were reviewed today: Echo and zio monitor   Past Medical History:  Diagnosis Date   Allergy    Anxiety    Asthma    Bipolar 2 disorder (HCC)    Gallstones    PTSD (post-traumatic stress disorder)    SVT (supraventricular tachycardia)     Past Surgical History:  Procedure Laterality Date   CHOLECYSTECTOMY     compound fracture right arm Right    FRACTURE SURGERY     NASAL RECONSTRUCTION        OB History     Gravida  6   Para  1   Term  1   Preterm  0   AB  4   Living  1      SAB  4   IAB  0   Ectopic  0   Multiple       Live Births  1               Current Medications: Active Medications[1]   Allergies:   Other and Latex   Social History   Socioeconomic History   Marital status: Married    Spouse name: Not on file   Number of children: Not on file   Years of education: Not on file   Highest education level: Not on file  Occupational History   Not on file  Tobacco Use   Smoking status: Never    Passive exposure: Never   Smokeless tobacco: Never   Tobacco comments:    Stopped smoking 3 months before she got pregnant  Vaping Use   Vaping status: Former   Substances: Nicotine   Substance and Sexual Activity   Alcohol use: Not Currently   Drug use: Never   Sexual activity: Yes  Other Topics Concern   Not on file  Social History Narrative   Is in 10th grade at [employer]   Social Drivers of Health   Tobacco Use: Low Risk (02/01/2024)   Patient History    Smoking Tobacco Use: Never    Smokeless Tobacco Use: Never  Passive Exposure: Never  Recent Concern: Tobacco Use - Medium Risk (12/04/2023)   Patient History    Smoking Tobacco Use: Former    Smokeless Tobacco Use: Never    Passive Exposure: Past  Programmer, Applications: Not on file  Food Insecurity: No Food Insecurity (11/29/2023)   Epic    Worried About Programme Researcher, Broadcasting/film/video in the Last Year: Never true    Ran Out of Food in the Last Year: Never true  Recent Concern: Food Insecurity - Food Insecurity Present (10/09/2023)   Epic    Worried About Programme Researcher, Broadcasting/film/video in the Last Year: Sometimes true    [employer] of Food in the Last Year: Sometimes true  Transportation Needs: No Transportation Needs (11/29/2023)   Epic    Lack of Transportation (Medical): No    Lack of Transportation (Non-Medical): No  Physical Activity: Not on file  Stress: Not on file  Social Connections: Not on file  Depression (EYV7-0): High Risk (01/23/2024)   Depression (PHQ2-9)    PHQ-2 Score: 18  Alcohol Screen: Medium Risk  (07/01/2021)   Alcohol Screen    Last Alcohol Screening Score (AUDIT): 14  Housing: Low Risk (11/02/2023)   Received from [facility]   Epic    What is your living situation today?: I have a steady place to live    Think about the place you live. Do you have problems with any of the following? Choose all that apply:: None/None on this list  Recent Concern: Housing - High Risk (10/09/2023)   Epic    Unable to Pay for Housing in the Last Year: Yes    Number of Times Moved in the Last Year: 0    Homeless in the Last Year: No  Utilities: Low Risk (11/02/2023)   Received from [facility]   Utilities    In the past 12 months has the electric, gas, oil, or water company threatened to shut off services in your home? : No  Health Literacy: Not on file      Family History  Problem Relation Age of Onset   Diabetes Maternal Grandmother    Hypertension Maternal Grandmother    Hyperlipidemia Maternal Grandmother    Heart disease Maternal Grandfather    Cancer Maternal Grandfather    Diabetes Maternal Grandfather    Heart failure Neg Hx       ROS:   Please see the history of present illness.     All other systems reviewed and are negative.   Labs/EKG Reviewed:    EKG:   EKG was not ordered today  Recent Labs: 01/25/2024: ALT 12; BUN <5; Creatinine, Ser 0.47; Hemoglobin 10.3; Platelets 287; Potassium 3.8; Sodium 138; TSH 1.630   Recent Lipid Panel Lab Results  Component Value Date/Time   CHOL 159 07/01/2021 04:38 PM   TRIG 76 07/01/2021 04:38 PM   HDL 42 07/01/2021 04:38 PM   CHOLHDL 3.8 07/01/2021 04:38 PM   LDLCALC 102 (H) 07/01/2021 04:38 PM    Physical Exam:    VS:  BP 109/83 (BP Location: Right Arm, Patient Position: Sitting, Cuff Size: Large)   Pulse (!) 126   Ht 5' 7.5 (1.715 m)   Wt (!) 320 lb (145.2 kg)   LMP 05/15/2023   BMI 49.38 kg/m     Wt Readings from Last 3 Encounters:  02/02/24 (!) 320 lb (145.2 kg)  02/01/24 (!) 317 lb 8 oz (144 kg)  01/25/24  (!) 323 lb 6.4 oz (146.7  kg)     GEN:  Well nourished, well developed in no acute distress HEENT: Normal NECK: No JVD; No carotid bruits LYMPHATICS: No lymphadenopathy CARDIAC: RRR, no murmurs, rubs, gallops RESPIRATORY:  Clear to auscultation without rales, wheezing or rhonchi  ABDOMEN: Soft, non-tender, non-distended MUSCULOSKELETAL:  No edema; No deformity  SKIN: Warm and dry NEUROLOGIC:  Alert and oriented x 3 PSYCHIATRIC:  Normal affect    Risk Assessment/Risk Calculators:     CARPREG II Risk Prediction Index Score:  1.  The patient's risk for a primary cardiac event is 5%.   Modified World Health Organization [facility]) Classification of Maternal CV Risk   Class I         ASSESSMENT & PLAN:    Pregnancy presyncope heart rates in the 140s causing dizziness and presyncope, exacerbated by pregnancy. Current dose of propranolol  regimen insufficient.  Increased propranolol  to 10 mg three times daily. - Continue propranolol  until delivery. - No repeat testing needed. - Provided note for husband's employer regarding her condition., she will send me the information via mychart Advised patient not to drive    Patient Instructions  Medication Instructions:  Your physician has recommended you make the following change in your medication:  INCREASE: Propranolol  10 mg every 8 hours  Lab Work: None ordered today   Testing/Procedures: None ordered today  Follow-Up: At [facility], you and your health needs are our priority.  As part of our continuing mission to provide you with exceptional heart care, our providers are all part of one team.  This team includes your primary Cardiologist (physician) and Advanced Practice Providers or APPs (Physician Assistants and Nurse Practitioners) who all work together to provide you with the care you need, when you need it.  Your next appointment:   10 weeks via MyChart  Provider:   Kiele Heavrin, DO     Other  Instructions:      Dispo:  No follow-ups on file.   Medication Adjustments/Labs and Tests Ordered: Current medicines are reviewed at length with the patient today.  Concerns regarding medicines are outlined above.  Tests Ordered: No orders of the defined types were placed in this encounter.  Medication Changes: Meds ordered this encounter  Medications   propranolol  (INDERAL ) 10 MG tablet    Sig: Take 1 tablet (10 mg total) by mouth every 8 (eight) hours.    Dispense:  270 tablet    Refill:  3      [1]  Current Meds  Medication Sig   acetaminophen  (TYLENOL ) 500 MG tablet Take 2 tablets (1,000 mg total) by mouth every 6 (six) hours as needed for mild pain.   ALBUTEROL  IN Inhale 1-2 puffs into the lungs as needed.   ARIPiprazole  (ABILIFY ) 10 MG tablet Take 1 tablet (10 mg total) by mouth daily.   beclomethasone (QVAR ) 80 MCG/ACT inhaler Inhale 2 puffs into the lungs daily as needed (for shortenss of breath).   busPIRone  (BUSPAR ) 10 MG tablet Take 1 tablet (10 mg total) by mouth 2 (two) times daily.   busPIRone  (BUSPAR ) 7.5 MG tablet Patient to take buspirone  7.5 mg 2 times daily for 6 days then continue taking 10 mg 2 times daily.   cyclobenzaprine  (FLEXERIL ) 5 MG tablet Take 1 tablet (5 mg total) by mouth 3 (three) times daily as needed for muscle spasms.   ferrous sulfate  325 (65 FE) MG tablet Take 1 tablet (325 mg total) by mouth with breakfast on Monday, Wednesday, and Friday.   naloxone (  NARCAN) nasal spray 4 mg/0.1 mL Place 1 spray into the nose once.   ondansetron  (ZOFRAN -ODT) 4 MG disintegrating tablet Take 1-2 tablets (4-8 mg total) by mouth every 8 (eight) hours as needed for nausea or vomiting.   Prenatal Vit-Fe Fumarate-FA (MULTIVITAMIN-PRENATAL) 27-0.8 MG TABS tablet Take 1 tablet by mouth daily at 12 noon.   propranolol  (INDERAL ) 10 MG tablet Take 1 tablet (10 mg total) by mouth every 8 (eight) hours.   [DISCONTINUED] propranolol  (INDERAL ) 20 MG tablet Take 0.5  tablets (10 mg total) by mouth 2 (two) times daily.   "

## 2024-02-05 ENCOUNTER — Telehealth (HOSPITAL_COMMUNITY): Payer: Self-pay | Admitting: *Deleted

## 2024-02-05 ENCOUNTER — Encounter (HOSPITAL_COMMUNITY): Payer: Self-pay | Admitting: *Deleted

## 2024-02-05 NOTE — Telephone Encounter (Signed)
 Preadmission screen

## 2024-02-07 ENCOUNTER — Telehealth: Payer: Self-pay

## 2024-02-07 ENCOUNTER — Inpatient Hospital Stay (HOSPITAL_COMMUNITY)
Admission: AD | Admit: 2024-02-07 | Discharge: 2024-02-09 | Disposition: A | Payer: MEDICAID | Source: Home / Self Care | Attending: Obstetrics and Gynecology | Admitting: Obstetrics and Gynecology

## 2024-02-07 ENCOUNTER — Other Ambulatory Visit: Payer: Self-pay

## 2024-02-07 ENCOUNTER — Encounter (HOSPITAL_COMMUNITY): Payer: Self-pay | Admitting: Obstetrics and Gynecology

## 2024-02-07 ENCOUNTER — Encounter: Payer: Self-pay | Admitting: Family Medicine

## 2024-02-07 ENCOUNTER — Telehealth: Payer: Self-pay | Admitting: Family Medicine

## 2024-02-07 VITALS — BP 147/90 | HR 95

## 2024-02-07 DIAGNOSIS — O0993 Supervision of high risk pregnancy, unspecified, third trimester: Secondary | ICD-10-CM

## 2024-02-07 DIAGNOSIS — O169 Unspecified maternal hypertension, unspecified trimester: Secondary | ICD-10-CM | POA: Diagnosis present

## 2024-02-07 DIAGNOSIS — Z3A38 38 weeks gestation of pregnancy: Secondary | ICD-10-CM

## 2024-02-07 DIAGNOSIS — O133 Gestational [pregnancy-induced] hypertension without significant proteinuria, third trimester: Secondary | ICD-10-CM

## 2024-02-07 DIAGNOSIS — F411 Generalized anxiety disorder: Secondary | ICD-10-CM

## 2024-02-07 DIAGNOSIS — O09299 Supervision of pregnancy with other poor reproductive or obstetric history, unspecified trimester: Secondary | ICD-10-CM

## 2024-02-07 DIAGNOSIS — F3181 Bipolar II disorder: Secondary | ICD-10-CM | POA: Diagnosis present

## 2024-02-07 DIAGNOSIS — Z6841 Body Mass Index (BMI) 40.0 and over, adult: Secondary | ICD-10-CM

## 2024-02-07 DIAGNOSIS — O3660X0 Maternal care for excessive fetal growth, unspecified trimester, not applicable or unspecified: Secondary | ICD-10-CM

## 2024-02-07 DIAGNOSIS — O99019 Anemia complicating pregnancy, unspecified trimester: Secondary | ICD-10-CM | POA: Diagnosis present

## 2024-02-07 DIAGNOSIS — O35BXX0 Maternal care for other (suspected) fetal abnormality and damage, fetal cardiac anomalies, not applicable or unspecified: Principal | ICD-10-CM | POA: Diagnosis present

## 2024-02-07 LAB — COMPREHENSIVE METABOLIC PANEL WITH GFR
ALT: 13 U/L (ref 0–44)
AST: 30 U/L (ref 15–41)
Albumin: 3.2 g/dL — ABNORMAL LOW (ref 3.5–5.0)
Alkaline Phosphatase: 220 U/L — ABNORMAL HIGH (ref 38–126)
Anion gap: 15 (ref 5–15)
BUN: 7 mg/dL (ref 6–20)
CO2: 16 mmol/L — ABNORMAL LOW (ref 22–32)
Calcium: 9.6 mg/dL (ref 8.9–10.3)
Chloride: 105 mmol/L (ref 98–111)
Creatinine, Ser: 0.49 mg/dL (ref 0.44–1.00)
GFR, Estimated: >60 mL/min
Glucose, Bld: 68 mg/dL — ABNORMAL LOW (ref 70–99)
Potassium: 4.5 mmol/L (ref 3.5–5.1)
Sodium: 136 mmol/L (ref 135–145)
Total Bilirubin: 0.4 mg/dL (ref 0.0–1.2)
Total Protein: 7 g/dL (ref 6.5–8.1)

## 2024-02-07 LAB — CBC
HCT: 34.5 % — ABNORMAL LOW (ref 36.0–46.0)
Hemoglobin: 10.9 g/dL — ABNORMAL LOW (ref 12.0–15.0)
MCH: 24.6 pg — ABNORMAL LOW (ref 26.0–34.0)
MCHC: 31.6 g/dL (ref 30.0–36.0)
MCV: 77.9 fL — ABNORMAL LOW (ref 80.0–100.0)
Platelets: 309 10*3/uL (ref 150–400)
RBC: 4.43 MIL/uL (ref 3.87–5.11)
RDW: 16 % — ABNORMAL HIGH (ref 11.5–15.5)
WBC: 11.8 10*3/uL — ABNORMAL HIGH (ref 4.0–10.5)
nRBC: 0 % (ref 0.0–0.2)

## 2024-02-07 LAB — TYPE AND SCREEN
ABO/RH(D): O POS
Antibody Screen: NEGATIVE

## 2024-02-07 LAB — PROTEIN / CREATININE RATIO, URINE
Creatinine, Urine: 175 mg/dL
Protein Creatinine Ratio: 0.1 mg/mg
Total Protein, Urine: 18 mg/dL

## 2024-02-07 MED ORDER — OXYTOCIN-SODIUM CHLORIDE 30-0.9 UT/500ML-% IV SOLN
1.0000 m[IU]/min | INTRAVENOUS | Status: DC
  Administered 2024-02-08: 2 m[IU]/min via INTRAVENOUS
  Filled 2024-02-07: qty 500

## 2024-02-07 MED ORDER — LACTATED RINGERS IV SOLN: INTRAVENOUS | Status: DC

## 2024-02-07 MED ORDER — OXYTOCIN BOLUS FROM INFUSION
333.0000 mL | Freq: Once | INTRAVENOUS | Status: AC
  Administered 2024-02-08: 333 mL via INTRAVENOUS

## 2024-02-07 MED ORDER — FENTANYL CITRATE (PF) 100 MCG/2ML IJ SOLN
50.0000 ug | INTRAMUSCULAR | Status: DC | PRN
  Administered 2024-02-08: 100 ug via INTRAVENOUS
  Filled 2024-02-07 (×2): qty 2

## 2024-02-07 MED ORDER — MISOPROSTOL 50MCG HALF TABLET
50.0000 ug | ORAL_TABLET | Freq: Once | ORAL | Status: AC
  Administered 2024-02-07: 50 ug via ORAL
  Filled 2024-02-07: qty 1

## 2024-02-07 MED ORDER — EPHEDRINE 5 MG/ML INJ
10.0000 mg | INTRAVENOUS | Status: DC | PRN
  Administered 2024-02-08: 10 mg via INTRAVENOUS

## 2024-02-07 MED ORDER — ARIPIPRAZOLE 10 MG PO TABS
10.0000 mg | ORAL_TABLET | Freq: Every day | ORAL | Status: DC
  Administered 2024-02-07 – 2024-02-09 (×3): 10 mg via ORAL
  Filled 2024-02-07 (×3): qty 1

## 2024-02-07 MED ORDER — PHENYLEPHRINE 80 MCG/ML (10ML) SYRINGE FOR IV PUSH (FOR BLOOD PRESSURE SUPPORT)
80.0000 ug | PREFILLED_SYRINGE | INTRAVENOUS | Status: DC | PRN
  Administered 2024-02-08: 80 ug via INTRAVENOUS
  Filled 2024-02-07: qty 10

## 2024-02-07 MED ORDER — BUSPIRONE HCL 5 MG PO TABS
10.0000 mg | ORAL_TABLET | Freq: Two times a day (BID) | ORAL | Status: DC
  Administered 2024-02-07 – 2024-02-09 (×4): 10 mg via ORAL
  Filled 2024-02-07: qty 2
  Filled 2024-02-07: qty 1
  Filled 2024-02-07: qty 2
  Filled 2024-02-07 (×2): qty 1

## 2024-02-07 MED ORDER — OXYCODONE-ACETAMINOPHEN 5-325 MG PO TABS: 1.0000 | ORAL_TABLET | ORAL | Status: DC | PRN

## 2024-02-07 MED ORDER — PHENYLEPHRINE 80 MCG/ML (10ML) SYRINGE FOR IV PUSH (FOR BLOOD PRESSURE SUPPORT)
80.0000 ug | PREFILLED_SYRINGE | INTRAVENOUS | Status: AC | PRN
  Administered 2024-02-08: 80 ug via INTRAVENOUS
  Administered 2024-02-08: 160 ug via INTRAVENOUS

## 2024-02-07 MED ORDER — LIDOCAINE HCL (PF) 1 % IJ SOLN: 30.0000 mL | INTRAMUSCULAR | Status: DC | PRN

## 2024-02-07 MED ORDER — ONDANSETRON HCL 4 MG/2ML IJ SOLN
4.0000 mg | Freq: Four times a day (QID) | INTRAMUSCULAR | Status: DC | PRN
  Administered 2024-02-07 – 2024-02-08 (×3): 4 mg via INTRAVENOUS
  Filled 2024-02-07 (×3): qty 2

## 2024-02-07 MED ORDER — EPHEDRINE 5 MG/ML INJ
10.0000 mg | INTRAVENOUS | Status: DC | PRN
  Filled 2024-02-07: qty 5

## 2024-02-07 MED ORDER — OXYTOCIN-SODIUM CHLORIDE 30-0.9 UT/500ML-% IV SOLN: 2.5000 [IU]/h | INTRAVENOUS | Status: DC

## 2024-02-07 MED ORDER — SODIUM CHLORIDE 0.9 % IV SOLN
14.0000 mL/h | INTRAVENOUS | Status: DC | PRN
  Administered 2024-02-08: 14 mL/h via EPIDURAL
  Filled 2024-02-07 (×5): qty 17

## 2024-02-07 MED ORDER — LACTATED RINGERS IV SOLN
500.0000 mL | Freq: Once | INTRAVENOUS | Status: AC
  Administered 2024-02-08: 500 mL via INTRAVENOUS

## 2024-02-07 MED ORDER — TERBUTALINE SULFATE 1 MG/ML IJ SOLN: 0.2500 mg | Freq: Once | INTRAMUSCULAR | Status: DC | PRN

## 2024-02-07 MED ORDER — PROPRANOLOL HCL 10 MG PO TABS
10.0000 mg | ORAL_TABLET | Freq: Three times a day (TID) | ORAL | Status: DC
  Filled 2024-02-07 (×2): qty 1

## 2024-02-07 MED ORDER — LACTATED RINGERS IV SOLN
500.0000 mL | INTRAVENOUS | Status: DC | PRN
  Administered 2024-02-08: 500 mL via INTRAVENOUS
  Administered 2024-02-08: 1000 mL via INTRAVENOUS

## 2024-02-07 MED ORDER — PROPRANOLOL HCL 10 MG PO TABS
10.0000 mg | ORAL_TABLET | Freq: Three times a day (TID) | ORAL | Status: DC
  Administered 2024-02-07 – 2024-02-08 (×3): 10 mg via ORAL
  Filled 2024-02-07 (×7): qty 1

## 2024-02-07 MED ORDER — ACETAMINOPHEN 325 MG PO TABS: 650.0000 mg | ORAL_TABLET | ORAL | Status: DC | PRN

## 2024-02-07 MED ORDER — MISOPROSTOL 25 MCG QUARTER TABLET
25.0000 ug | ORAL_TABLET | Freq: Once | ORAL | Status: AC
  Administered 2024-02-07: 25 ug via VAGINAL
  Filled 2024-02-07: qty 1

## 2024-02-07 MED ORDER — SOD CITRATE-CITRIC ACID 500-334 MG/5ML PO SOLN: 30.0000 mL | ORAL | Status: DC | PRN

## 2024-02-07 MED ORDER — MISOPROSTOL 50MCG HALF TABLET
50.0000 ug | ORAL_TABLET | ORAL | Status: DC
  Administered 2024-02-08: 50 ug via ORAL
  Filled 2024-02-07 (×3): qty 1

## 2024-02-07 MED ORDER — DIPHENHYDRAMINE HCL 50 MG/ML IJ SOLN
12.5000 mg | INTRAMUSCULAR | Status: DC | PRN
  Administered 2024-02-08 (×2): 12.5 mg via INTRAVENOUS
  Filled 2024-02-07: qty 1

## 2024-02-07 NOTE — H&P (Cosign Needed Addendum)
 " Emily Golden is a 24 y.o. [phone] female at [redacted]w[redacted]d by LMP c/w 30wk u/s presenting for IOL due to gHTN. Denies pre-e s/s. Reports decreased  fetal movement since this morning, contractions: irreg/mild, vaginal bleeding: none, membranes: intact.  Initiated prenatal care at [facility] at 28 wks.   Most recent u/s : [redacted]w[redacted]d, EFW 74%, AFI 12cm, ant placenta, cephalic .   This pregnancy complicated by: # late to care (28wks) # short interval pregnancy # dx gHTN @ 30wks # bipolar 2 d/o # GAD # anemia # BMI 49  Prenatal History/Complications:  # hx first child w TOF (term vag del 2024) # hx pre-e with first preg  Past Medical History: Past Medical History:  Diagnosis Date   Alcohol consumption binge drinking 07/02/2021   Allergy    Anxiety    Asthma    Bipolar 2 disorder (HCC)    Complication of anesthesia    itching from epidural   Gallstones    History of pre-eclampsia    Irritable bowel syndrome 05/26/2022   PTSD (post-traumatic stress disorder)    Suicide attempt by drug ingestion (HCC) 06/09/2018   SVT (supraventricular tachycardia)     Past Surgical History: Past Surgical History:  Procedure Laterality Date   CHOLECYSTECTOMY     compound fracture right arm Right    FRACTURE SURGERY     NASAL RECONSTRUCTION      Obstetrical History: OB History     Gravida  6   Para  1   Term  1   Preterm  0   AB  4   Living  1      SAB  4   IAB  0   Ectopic  0   Multiple      Live Births  1           Social History: Social History   Socioeconomic History   Marital status: Married    Spouse name: Not on file   Number of children: Not on file   Years of education: Not on file   Highest education level: Not on file  Occupational History   Not on file  Tobacco Use   Smoking status: Never    Passive exposure: Never   Smokeless tobacco: Never   Tobacco comments:    Stopped smoking 3 months before she got pregnant  Vaping Use   Vaping status: Former    Substances: Nicotine   Substance and Sexual Activity   Alcohol use: Not Currently   Drug use: Never   Sexual activity: Yes  Other Topics Concern   Not on file  Social History Narrative   Is in 10th grade at [employer]   Social Drivers of Health   Tobacco Use: Low Risk (02/07/2024)   Patient History    Smoking Tobacco Use: Never    Smokeless Tobacco Use: Never    Passive Exposure: Never  Recent Concern: Tobacco Use - Medium Risk (12/04/2023)   Patient History    Smoking Tobacco Use: Former    Smokeless Tobacco Use: Never    Passive Exposure: Past  Physicist, Medical Strain: Not on file  Food Insecurity: No Food Insecurity (02/07/2024)   Epic    Worried About Programme Researcher, Broadcasting/film/video in the Last Year: Never true    Ran Out of Food in the Last Year: Never true  Transportation Needs: No Transportation Needs (02/07/2024)   Epic    Lack of Transportation (Medical): No    Lack of  Transportation (Non-Medical): No  Physical Activity: Not on file  Stress: Not on file  Social Connections: Not on file  Depression (PHQ2-9): High Risk (01/23/2024)   Depression (PHQ2-9)    PHQ-2 Score: 18  Alcohol Screen: Medium Risk (07/01/2021)   Alcohol Screen    Last Alcohol Screening Score (AUDIT): 14  Housing: High Risk (02/07/2024)   Epic    Unable to Pay for Housing in the Last Year: Yes    Number of Times Moved in the Last Year: 0    Homeless in the Last Year: Yes  Utilities: Not At Risk (02/07/2024)   Epic    Threatened with loss of utilities: No  Health Literacy: Not on file    Family History: Family History  Problem Relation Age of Onset   Diabetes Maternal Grandmother    Hypertension Maternal Grandmother    Hyperlipidemia Maternal Grandmother    Heart disease Maternal Grandfather    Cancer Maternal Grandfather    Diabetes Maternal Grandfather    Heart failure Neg Hx     Allergies: Allergies[1]  Medications Prior to Admission  Medication Sig Dispense Refill Last  Dose/Taking   ALBUTEROL  IN Inhale 1-2 puffs into the lungs as needed.   Past Month   ARIPiprazole  (ABILIFY ) 10 MG tablet Take 1 tablet (10 mg total) by mouth daily. 30 tablet 1 02/06/2024   busPIRone  (BUSPAR ) 10 MG tablet Take 1 tablet (10 mg total) by mouth 2 (two) times daily. 60 tablet 1 02/06/2024   cyclobenzaprine  (FLEXERIL ) 5 MG tablet Take 1 tablet (5 mg total) by mouth 3 (three) times daily as needed for muscle spasms. 20 tablet 0 02/07/2024   ferrous sulfate  325 (65 FE) MG tablet Take 1 tablet (325 mg total) by mouth with breakfast on Monday, Wednesday, and Friday. 30 tablet 11 Past Week   ondansetron  (ZOFRAN -ODT) 4 MG disintegrating tablet Take 1-2 tablets (4-8 mg total) by mouth every 8 (eight) hours as needed for nausea or vomiting. 20 tablet 2 Past Week   Prenatal Vit-Fe Fumarate-FA (MULTIVITAMIN-PRENATAL) 27-0.8 MG TABS tablet Take 1 tablet by mouth daily at 12 noon.   02/06/2024   propranolol  (INDERAL ) 10 MG tablet Take 1 tablet (10 mg total) by mouth every 8 (eight) hours. 270 tablet 3 02/06/2024   acetaminophen  (TYLENOL ) 500 MG tablet Take 2 tablets (1,000 mg total) by mouth every 6 (six) hours as needed for mild pain.      beclomethasone (QVAR ) 80 MCG/ACT inhaler Inhale 2 puffs into the lungs daily as needed (for shortenss of breath).   More than a month   naloxone (NARCAN) nasal spray 4 mg/0.1 mL Place 1 spray into the nose once.       Review of Systems  Pertinent pos/neg as indicated in HPI  Blood pressure (!) 134/95, pulse (!) 103, temperature 97.9 F (36.6 C), temperature source Oral, resp. rate 20, last menstrual period 05/15/2023, SpO2 99%, not currently breastfeeding. General appearance: alert, cooperative, and no distress Lungs: clear to auscultation bilaterally Heart: regular rate and rhythm Abdomen: gravid, soft, non-tender, EFW by Leopold's approximately 7-8lbs Extremities: 1+ edema  Fetal monitoring: FHR: 125-130s bpm, variability: moderate,  Accelerations: Present,   decelerations:  Absent Uterine activity: q 4 mins   Presentation: cephalic   Prenatal labs: ABO, Rh: O/Positive/-- (07/16 0000) Antibody: Negative (07/16 0000) Rubella: Immune (07/17 0000) RPR: Non Reactive (12/11 1157)  HBsAg: Negative (07/16 0000)  HIV: Non Reactive (12/11 1157)  GBS: Negative/-- (01/20 1055)  2hr GTT: 69/119/69  Prenatal Transfer  Tool  Maternal Diabetes: No Genetic Screening: Normal Maternal Ultrasounds/Referrals: Normal Fetal Ultrasounds or other Referrals:  None Maternal Substance Abuse:  No Significant Maternal Medications:  Meds include: Other: buspar ; Abilify ; propranolol  Significant Maternal Lab Results: Group B Strep negative  No results found for this or any previous visit (from the past 24 hours).   Assessment:  [redacted]w[redacted]d [facility]  [id number]  gHTN (dx 30wks) with hx pre-e (first preg)  BMI 49  Cat 1 FHR  GBS Negative/-- (01/20 1055)  Plan:  Admit to L&D  IV pain meds/epidural prn active labor  Plan cytotec /cervical foley/Pit/AROM, either alone or in combination, for cervical ripening and labor induction  Anticipate vag delivery   Plans to breastfeed  Contraception: postplacental IUD (Mirena )   Suzen JONETTA Gentry CNM 02/07/2024, 6:38 PM      [1]  Allergies Allergen Reactions   Other Shortness Of Breath    Dust, Reports smells,perfumes,aerosols HX asthma    Latex Rash   "

## 2024-02-07 NOTE — Progress Notes (Signed)
 "  OBSTETRICS PRENATAL VIRTUAL VISIT ENCOUNTER NOTE  Provider location: [facility]   Patient location: Home  I connected with Emily Golden on 02/07/24 at  9:55 AM EST by MyChart Video Encounter and verified that I am speaking with the correct person using two identifiers. I discussed the limitations, risks, security and privacy concerns of performing an evaluation and management service virtually and the availability of in person appointments. I also discussed with the patient that there may be a patient responsible charge related to this service. The patient expressed understanding and agreed to proceed. Subjective:  Emily Golden is a 24 y.o. H3E8958 at [redacted]w[redacted]d being seen today for ongoing prenatal care.  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  Patient reports no complaints.  Contractions: Irregular. Vag. Bleeding: None.  Movement: Present. Denies any leaking of fluid.   The following portions of the patient's history were reviewed and updated as appropriate: allergies, current medications, past family history, past medical history, past social history, past surgical history and problem list.   Objective:    Vitals:   02/07/24  1000 02/07/24 1029  BP: (!) 150/95 (!) 147/90  Pulse: 95     Fetal Status:      Movement: Present    General: Alert, oriented and cooperative. Patient is in no acute distress.  Respiratory: Normal respiratory effort, no problems with respiration noted  Mental Status: Normal mood and affect. Normal behavior. Normal judgment and thought content.  Rest of physical exam deferred due to type of encounter  Imaging: US  MFM FETAL BPP WO NON STRESS Result Date: 01/22/2024 ----------------------------------------------------------------------  OBSTETRICS REPORT                       (Signed Final 01/22/2024 04:14 pm) ---------------------------------------------------------------------- Patient Info  ID #:       [id number]                          D.O.B.:  05/14/2000 (23 yrs)(F)  Name:       Emily Golden                     Visit Date: 01/22/2024 03:20 pm ---------------------------------------------------------------------- Performed By  Attending:        Delora Smaller DO       Ref. Address:     [address]                                                             [place], [place]  [address]  Performed By:     Emmaline Forte         [facility]  Referred By:      [facility] ---------------------------------------------------------------------- Orders  #  Description                           Code        Ordered By  1  US  MFM FETAL BPP WO NON               76819.01    RAVI SHANKAR     STRESS  2  US  MFM OB DETAIL +14 WK               76811.01    RAVI [facility] ----------------------------------------------------------------------  #  Order #                     Accession #                Episode  #  1  [id number]                   [id number]                 [id number]  2  [id number]                   [id number]                 [id number] ---------------------------------------------------------------------- Indications  Obesity complicating pregnancy, third          O99.213  trimester (BMI 49)  Poor obstetric history: Previous preeclampsia  O09.299  Large for gestational age fetus affecting      O36.60X0  management of mother (resolved)  Previous pregnacy with congenital heart        O09.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 Cord Insertion:      Previously seen  Amniotic Fluid  AFI FV:      Within normal limits  AFI Sum(cm)     %Tile       Largest Pocket(cm)  12.66           42          5.07  RUQ(cm)       RLQ(cm)       LUQ(cm)        LLQ(cm)  0             5.07          4.53           3.06 ---------------------------------------------------------------------- Biometry  BPD:      88.3  mm     G. Age:  35w 5d         49  %    CI:        72.71   %    70 - 86                                                          FL/HC:      20.1   %    20.1 - 22.1  HC:      329.3  mm     G. Age:  37w 3d         55  %    HC/AC:      0.96        0.93 - 1.11  AC:      342.6  mm     G. Age:  38w 1d         97  %    FL/BPD:     75.0   %    71 - 87  FL:       66.2  mm     G. Age:  34w 1d          7  %    FL/AC:      19.3   %    20 - 24  LV:          6  mm  Est. FW:    3047  gm    6 lb 11 oz      74  %  Est. FW at 39 Wks:       3720  gm     8 lb 3 oz ---------------------------------------------------------------------- OB History  Gravidity:    6         Term:   1        Prem:   0        SAB:   4  TOP:          0       Ectopic:  0        Living: 1  ---------------------------------------------------------------------- Gestational Age  LMP:           36w 0d        Date:  05/15/23                  EDD:   02/19/24  Clinical EDD:  35w 2d                                        EDD:   02/24/24  U/S Today:     36w 3d                                        EDD:   02/16/24  Best:          36w 0d     Det. By:  LMP  (05/15/23)          EDD:   02/19/24 ---------------------------------------------------------------------- Targeted Anatomy  Central Nervous System  Calvarium/Cranial V.:  Appears normal         Cereb./Vermis:          Appears normal  Cavum:                 Appears normal         Cisterna Magna:         Not well visualized  Lateral Ventricles:    Appears normal         Midline Falx:           Appears normal  Choroid Plexus:        Not well visualized  Spine  Cervical:              Not well visualized    Sacral:                 Not well visualized  Thoracic:              Not well visualized    Shape/Curvature:        Not well visualized  Lumbar:                Not well visualized  Head/Neck  Lips:                  Not well visualized    Profile:                Not well visualized  Neck:                  Appears normal         Orbits/Eyes:            Not well visualized  Nuchal Fold:           Appears normal         Mandible:               Not well visualized  Nasal Bone:            Not well visualized    Maxilla:                Not well visualized  Thorax  4 Chamber View:        Not well visualized    Interventr. Septum:     Not well visualized  Cardiac Rhythm:        Normal                 Cardiac Axis:           Normal  Cardiac Situs:         Appears normal  Diaphragm:              Appears normal  Rt Outflow Tract:      Not well visualized    3 Vessel View:          Not well visualized  Lt Outflow Tract:      Not well visualized    3 V Trachea View:       Not well visualized  Aortic Arch:           Not well visualized    IVC:                    Not well  visualized  Ductal Arch:           Not well visualized    Crossing:               Not well visualized  SVC:                   Not well visualized  Abdomen  Ventral Wall:          Appears normal         Lt Kidney:              Appears normal  Cord Insertion:        Not well visualized    Rt Kidney:              Appears normal  Situs:                 Appears normal         Bladder:                Appears normal  Stomach:               Appears normal  Extremities  Lt Humerus:            Appears normal         Lt Femur:               Not well visualized  Rt Humerus:            Not well visualized    Rt Femur:               Not well visualized  Lt Forearm:            Not well visualized    Lt Lower Leg:           Not well visualized  Rt Forearm:            Not well visualized    Rt Lower Leg:           Not well visualized  Lt Hand:               Previously seen        Lt Foot:                Heel previously                                                                        seen  Rt Hand:  Not well visualized    Rt Foot:                Heel previously                                                                        seen  Other  Umbilical Cord:        Not well visualized    Genitalia:              Not well visualized  Comment:     Technically difficult due to maternal habitus, gestational age, and               fetal position. ---------------------------------------------------------------------- Cervix Uterus Adnexa  Cervix  Not visualized (advanced GA >24wks)  Uterus  No abnormality visualized.  Right Ovary  Not visualized.  Left Ovary  Not visualized.  Cul De Sac  No free fluid seen.  Adnexa  No abnormality visualized ---------------------------------------------------------------------- Comments  Sonographic findings  Single intrauterine pregnancy at 36w 0d.  Fetal cardiac activity:  Observed and appears normal.  Presentation: Cephalic.  The anatomic structures that were well seen appear normal.   Due to poor acoustic windows some structures remain  suboptimally visualized.  Fetal biometry shows the estimated fetal weight of 6 lb 11 oz,  3047 grams (74%).  Amniotic fluid: Within normal limits.  MVP: 5.07 cm.  Placenta: Anterior.  Adnexa: No abnormality visualized.  BPP 8/8.  There are limitations of prenatal ultrasound such as the  inability to detect certain abnormalities due to poor  visualization. Various factors such as fetal position,  gestational age and maternal body habitus may increase the  difficulty in visualizing the fetal anatomy.  Recommendations  - See Epic note for assessment and plan of care. Any  referring office that does not utilize Epic will recieve a copy of  today's consult note via fax. Please contact our office with  any concerns. ----------------------------------------------------------------------                  Delora Smaller, DO Electronically Signed Final Report   01/22/2024 04:14 pm ----------------------------------------------------------------------   US  MFM OB DETAIL +14 WK Result Date: 01/22/2024 ----------------------------------------------------------------------  OBSTETRICS REPORT                       (Signed Final 01/22/2024 04:14 pm) ---------------------------------------------------------------------- Patient Info  ID #:       [id number]                          D.O.B.:  04-Nov-2000 (23 yrs)(F)  Name:       Emily Golden                     Visit Date: 01/22/2024 03:20 pm ---------------------------------------------------------------------- Performed By  Attending:        Delora Smaller DO       Ref. Address:     [address]  [place], [place]                                                             [address]  Performed By:     Emmaline Forte         [facility]  Referred By:      [facility] ---------------------------------------------------------------------- Orders  #  Description                           Code        Ordered By  1  US  MFM FETAL BPP WO NON               76819.01    RAVI SHANKAR     STRESS  2  US  MFM OB DETAIL +14 WK               76811.01    RAVI [facility] ----------------------------------------------------------------------  #  Order #                     Accession #                Episode #  1  [id number]                   [id number]                 [id number]  2  [id number]                   [id number]                 [id number] ---------------------------------------------------------------------- Indications  Obesity complicating pregnancy, third          O99.213  trimester (BMI 49)  Poor obstetric history: Previous preeclampsia  O09.299  Large for gestational age fetus affecting      O36.60X0  management of mother (resolved)  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 Cord Insertion:      Previously seen  Amniotic Fluid  AFI FV:      Within normal limits  AFI Sum(cm)     %Tile       Largest Pocket(cm)  12.66           42          5.07  RUQ(cm)       RLQ(cm)       LUQ(cm)        LLQ(cm)  0             5.07          4.53           3.06 ---------------------------------------------------------------------- Biometry  BPD:      88.3  mm     G. Age:  35w 5d         49  %    CI:        72.71   %    70 - 86                                                          FL/HC:      20.1   %    20.1 - 22.1  HC:      329.3   mm     G. Age:  37w 3d         55  %    HC/AC:      0.96        0.93 - 1.11  AC:      342.6  mm     G. Age:  38w 1d         97  %    FL/BPD:     75.0   %    71 - 87  FL:       66.2  mm     G. Age:  34w 1d          7  %    FL/AC:      19.3   %    20 - 24  LV:          6  mm  Est. FW:    3047  gm    6 lb 11 oz      74  %  Est. FW at 39 Wks:       3720  gm     8 lb 3 oz ---------------------------------------------------------------------- OB History  Gravidity:    6         Term:   1        Prem:   0        SAB:   4  TOP:          0  Ectopic:  0        Living: 1 ---------------------------------------------------------------------- Gestational Age  LMP:           36w 0d        Date:  05/15/23                  EDD:   02/19/24  Clinical EDD:  35w 2d                                        EDD:   02/24/24  U/S Today:     36w 3d                                        EDD:   02/16/24  Best:          36w 0d     Det. By:  LMP  (05/15/23)          EDD:   02/19/24 ---------------------------------------------------------------------- Targeted Anatomy  Central Nervous System  Calvarium/Cranial V.:  Appears normal         Cereb./Vermis:          Appears normal  Cavum:                 Appears normal         Cisterna Magna:         Not well visualized  Lateral Ventricles:    Appears normal         Midline Falx:           Appears normal  Choroid Plexus:        Not well visualized  Spine  Cervical:              Not well visualized    Sacral:                 Not well visualized  Thoracic:              Not well visualized    Shape/Curvature:        Not well visualized  Lumbar:                Not well visualized  Head/Neck  Lips:                  Not well visualized    Profile:                Not well visualized  Neck:                  Appears normal         Orbits/Eyes:            Not well visualized  Nuchal Fold:           Appears normal         Mandible:               Not well visualized  Nasal Bone:            Not well visualized     Maxilla:                Not well visualized  Thorax  4 Chamber View:        Not well visualized    Interventr.  Septum:     Not well visualized  Cardiac Rhythm:        Normal                 Cardiac Axis:           Normal  Cardiac Situs:         Appears normal         Diaphragm:              Appears normal  Rt Outflow Tract:      Not well visualized    3 Vessel View:          Not well visualized  Lt Outflow Tract:      Not well visualized    3 V Trachea View:       Not well visualized  Aortic Arch:           Not well visualized    IVC:                    Not well visualized  Ductal Arch:           Not well visualized    Crossing:               Not well visualized  SVC:                   Not well visualized  Abdomen  Ventral Wall:          Appears normal         Lt Kidney:              Appears normal  Cord Insertion:        Not well visualized    Rt Kidney:              Appears normal  Situs:                 Appears normal         Bladder:                Appears normal  Stomach:               Appears normal  Extremities  Lt Humerus:            Appears normal         Lt Femur:               Not well visualized  Rt Humerus:            Not well visualized    Rt Femur:               Not well visualized  Lt Forearm:            Not well visualized    Lt Lower Leg:           Not well visualized  Rt Forearm:            Not well visualized    Rt Lower Leg:           Not well visualized  Lt Hand:               Previously seen        Lt Foot:                Heel previously  seen  Rt Hand:               Not well visualized    Rt Foot:                Heel previously                                                                        seen  Other  Umbilical Cord:        Not well visualized    Genitalia:              Not well visualized  Comment:     Technically difficult due to maternal habitus, gestational age, and               fetal position.  ---------------------------------------------------------------------- Cervix Uterus Adnexa  Cervix  Not visualized (advanced GA >24wks)  Uterus  No abnormality visualized.  Right Ovary  Not visualized.  Left Ovary  Not visualized.  Cul De Sac  No free fluid seen.  Adnexa  No abnormality visualized ---------------------------------------------------------------------- Comments  Sonographic findings  Single intrauterine pregnancy at 36w 0d.  Fetal cardiac activity:  Observed and appears normal.  Presentation: Cephalic.  The anatomic structures that were well seen appear normal.  Due to poor acoustic windows some structures remain  suboptimally visualized.  Fetal biometry shows the estimated fetal weight of 6 lb 11 oz,  3047 grams (74%).  Amniotic fluid: Within normal limits.  MVP: 5.07 cm.  Placenta: Anterior.  Adnexa: No abnormality visualized.  BPP 8/8.  There are limitations of prenatal ultrasound such as the  inability to detect certain abnormalities due to poor  visualization. Various factors such as fetal position,  gestational age and maternal body habitus may increase the  difficulty in visualizing the fetal anatomy.  Recommendations  - See Epic note for assessment and plan of care. Any  referring office that does not utilize Epic will recieve a copy of  today's consult note via fax. Please contact our office with  any concerns. ----------------------------------------------------------------------                  Delora Smaller, DO Electronically Signed Final Report   01/22/2024 04:14 pm ----------------------------------------------------------------------   US  Fetal BPP W/O Non Stress Result Date: 01/11/2024 ----------------------------------------------------------------------  OBSTETRICS REPORT                       (Signed Final 01/11/2024 01:17 pm) ---------------------------------------------------------------------- Patient Info  ID #:       [id number]                          D.O.B.:  02-Feb-2000 (23  yrs)(F)  Name:       Emily Golden                     Visit Date: 01/09/2024 12:14 pm ---------------------------------------------------------------------- Performed By  Attending:        Bebe Furry MD     Ref. Address:     [address]  [place], [place]                                                             [address]  Performed By:     Scarlet Flesher         [facility]  Referred By:      [facility] ---------------------------------------------------------------------- Orders  #  Description                           Code        Ordered By  1  US  FETAL BPP WO NON STRESS            76819.0     JERILYNN BUDDLE ----------------------------------------------------------------------  #  Order #                     Accession #                Episode #  1  [id number]                   [id number]                 [id number] ---------------------------------------------------------------------- Indications  [redacted] weeks gestation of pregnancy                Z3A.34  Previous pregnacy with congenital heart        O09.299  (cardiac) defect ---------------------------------------------------------------------- Fetal Evaluation  Num Of Fetuses:         1  Fetal Heart Rate(bpm):  130  Cardiac Activity:       Observed  Presentation:  Cephalic  AFI Sum(cm)     %Tile       Largest Pocket(cm)  13.21           43          3.54  RUQ(cm)       RLQ(cm)       LUQ(cm)        LLQ(cm)  3.54          3.32          3.15           3.2  Comment:    8/8 ---------------------------------------------------------------------- Biophysical Evaluation  Amniotic  F.V:   Pocket => 2 cm             F. Tone:        Observed  F. Movement:    Observed                   Score:          8/8  F. Breathing:   Observed ---------------------------------------------------------------------- OB History  Gravidity:    6         Term:   1        Prem:   0        SAB:   4  TOP:          0       Ectopic:  0        Living: 1 ---------------------------------------------------------------------- Gestational Age  LMP:           34w 1d        Date:  05/15/23                  EDD:   02/19/24  Clinical EDD:  33w 3d                                        EDD:   02/24/24  Best:          34w 1d     Det. By:  LMP  (05/15/23)          EDD:   02/19/24 ---------------------------------------------------------------------- Impression  Reassuring testing ---------------------------------------------------------------------- Recommendations  Continue weekly testing for BMI ----------------------------------------------------------------------                Bebe Furry, MD Electronically Signed Final Report   01/11/2024 01:17 pm ----------------------------------------------------------------------    Assessment and Plan:  Pregnancy: H3E8958 at [redacted]w[redacted]d 1. Supervision of high risk pregnancy in third trimester (Primary) Continue prenatal care.  2. [redacted] weeks gestation of pregnancy   3. Excessive fetal growth affecting pregnancy, antepartum, single or unspecified fetus Infant @ 74% For IOL @ 39 weeks  4. Bipolar 2 disorder (HCC) On Abilify , having some sadness Encouraged to reach out to her psychiatrist for possible addition of anti-depressant.  5. Hx of preeclampsia, prior pregnancy, currently pregnant BP is elevated today--to repeat in 4-5 hours, if still elevated, consider moving IOL to earlier than 39 weeks  6. GAD (generalized anxiety disorder) On Buspar , well controlled.  Preterm labor symptoms and general obstetric precautions including but not limited to vaginal bleeding,  contractions, leaking of fluid and fetal movement were reviewed in detail with the patient. I discussed the assessment and treatment plan with the patient. The patient was provided an opportunity to ask questions and all were answered. The patient agreed with the plan  and demonstrated an understanding of the instructions. The patient was advised to call back or seek an in-person office evaluation/go to [facility] at [facility] for any urgent or concerning symptoms. Please refer to After Visit Summary for other counseling recommendations.   I provided 6 minutes of face-to-face time during this encounter.  Return in 1 week (on 02/14/2024).  Future Appointments  Date Time Provider Department Center  02/09/2024  8:45 AM [facility] NST [facility]  02/12/2024  7:15 AM MC-LD SCHED ROOM MC-INDC None  02/16/2024 10:35 AM Anyanwu, Gloris LABOR, MD [facility]  02/21/2024  4:15 PM Ilean Norleen GAILS, MD [facility]  04/10/2024 10:00 AM Tobb, Dub, DO [facility]MAG[facility] H&V    Glenys GORMAN Birk, MD Center for [facility], [facility]  "

## 2024-02-07 NOTE — MAU Provider Note (Signed)
 Chief Complaint:  Decreased Fetal Movement, BP Evaluation, and Contractions   HPI   None     Emily Golden is a 24 y.o. H3E8958 at [redacted]w[redacted]d who presents to maternity admissions reporting DFM, contractions and elevated blood pressures today.   Pregnancy Course: MCW  Past Medical History:  Diagnosis Date   Alcohol consumption binge drinking 07/02/2021   Allergy    Anxiety    Asthma    Bipolar 2 disorder (HCC)    Complication of anesthesia    itching from epidural   Gallstones    History of pre-eclampsia    Irritable bowel syndrome 05/26/2022   PTSD (post-traumatic stress disorder)    Suicide attempt by drug ingestion (HCC) 06/09/2018   SVT (supraventricular tachycardia)    OB History  Gravida Para Term Preterm AB Living  6 1 1  0 4 1  SAB IAB Ectopic Multiple Live Births  4 0 0  1    # Outcome Date GA Lbr Len/2nd Weight Sex Type Anes PTL Lv  6 Current           5 Term 12/20/22 [redacted]w[redacted]d 01:11 / 00:25 3340 g M Vag-Spont EPI  LIV  4 SAB 10/04/21 [redacted]w[redacted]d   U         Birth Comments: SAB managed with cytotec   3 SAB 2022          2 SAB         ND  1 SAB            Past Surgical History:  Procedure Laterality Date   CHOLECYSTECTOMY     compound fracture right arm Right    FRACTURE SURGERY     NASAL RECONSTRUCTION     Family History  Problem Relation Age of Onset   Diabetes Maternal Grandmother    Hypertension Maternal Grandmother    Hyperlipidemia Maternal Grandmother    Heart disease Maternal Grandfather    Cancer Maternal Grandfather    Diabetes Maternal Grandfather    Heart failure Neg Hx    Social History[1] Allergies[2] Medications Prior to Admission  Medication Sig Dispense Refill Last Dose/Taking   ALBUTEROL  IN Inhale 1-2 puffs into the lungs as needed.   Past Month   ARIPiprazole  (ABILIFY ) 10 MG tablet Take 1 tablet (10 mg total) by mouth daily. 30 tablet 1 02/06/2024   busPIRone  (BUSPAR ) 10 MG tablet Take 1 tablet (10 mg total) by mouth 2 (two) times daily. 60  tablet 1 02/06/2024   cyclobenzaprine  (FLEXERIL ) 5 MG tablet Take 1 tablet (5 mg total) by mouth 3 (three) times daily as needed for muscle spasms. 20 tablet 0 02/07/2024   ferrous sulfate  325 (65 FE) MG tablet Take 1 tablet (325 mg total) by mouth with breakfast on Monday, Wednesday, and Friday. 30 tablet 11 Past Week   ondansetron  (ZOFRAN -ODT) 4 MG disintegrating tablet Take 1-2 tablets (4-8 mg total) by mouth every 8 (eight) hours as needed for nausea or vomiting. 20 tablet 2 Past Week   Prenatal Vit-Fe Fumarate-FA (MULTIVITAMIN-PRENATAL) 27-0.8 MG TABS tablet Take 1 tablet by mouth daily at 12 noon.   02/06/2024   propranolol  (INDERAL ) 10 MG tablet Take 1 tablet (10 mg total) by mouth every 8 (eight) hours. 270 tablet 3 02/06/2024   acetaminophen  (TYLENOL ) 500 MG tablet Take 2 tablets (1,000 mg total) by mouth every 6 (six) hours as needed for mild pain.      beclomethasone (QVAR ) 80 MCG/ACT inhaler Inhale 2 puffs into the lungs daily as needed (  for shortenss of breath).   More than a month   naloxone (NARCAN) nasal spray 4 mg/0.1 mL Place 1 spray into the nose once.       I have reviewed patient's Past Medical Hx, Surgical Hx, Family Hx, Social Hx, medications and allergies.   ROS  Pertinent items noted in HPI and remainder of comprehensive ROS otherwise negative.   PHYSICAL EXAM  Patient Vitals for the past 24 hrs:  BP Temp Temp src Pulse Resp SpO2  02/07/24 1750 (!) 134/95 97.9 F (36.6 C) Oral (!) 103 20 99 %  02/07/24 1708 124/77 97.8 F (36.6 C) Oral (!) 109 18 100 %    Physical Exam Vitals and nursing note reviewed.  Constitutional:      Appearance: Normal appearance.  HENT:     Head: Normocephalic.  Cardiovascular:     Rate and Rhythm: Normal rate.     Pulses: Normal pulses.  Pulmonary:     Effort: Pulmonary effort is normal.  Skin:    General: Skin is warm and dry.     Capillary Refill: Capillary refill takes less than 2 seconds.  Neurological:     General: No focal  deficit present.     Mental Status: She is alert and oriented to person, place, and time.  Psychiatric:        Mood and Affect: Mood normal.        Behavior: Behavior normal.        Thought Content: Thought content normal.        Judgment: Judgment normal.         Fetal Tracing: Baseline: 130 Variability: moderate Accelerations: 15x15 Decelerations: variable Toco: 2-4   Labs: No results found for this or any previous visit (from the past 24 hours).  Imaging:  No results found.  MDM & MAU COURSE  MDM: Low - Admit due to GHTN diagnosis  MAU Course: Orders Placed This Encounter  Procedures   Comprehensive metabolic panel with GFR   CBC   Protein / creatinine ratio, urine   RPR   Diet clear liquid Room service appropriate? Yes; Fluid consistency: Thin   Vitals signs per unit policy   Notify physician (specify)   Fetal monitoring per unit policy   Activity as tolerated   Cervical Exam   Measure blood pressure post delivery every 15 min x 1 hour then every 30 min x 1 hour   Fundal check post delivery every 15 min x 1 hour then every 30 min x 1 hour   Apply Labor & Delivery Care Plan   If Rapid HIV test positive or known HIV positive: initiate AZT orders   May in and out cath x 2 for inability to void   Insert urethral catheter X 1 PRN If Coude Catheter is chosen, qualified resources by campus can be found in the clinical skills nursing procedure for Coude Catheter 1. If straight catheterized > 2 times or patient unable to void post epidural plac...   Refer to Sidebar Report Urinary (Foley) Catheter Indications   Refer to Sidebar Report Post Indwelling Urinary Catheter Removal and Intervention Guidelines   Discontinue foley prior to vaginal delivery   Initiate Oral Care Protocol   Initiate Carrier Fluid Protocol   SCDs   Patient may have epidural placement upon request   Evaluate fetal heart rate to establish reassuring pattern prior to initiating Cytotec  or Pitocin     Perform a cervical exam prior to initiating Cytotec  or Pitocin    Discontinue  Pitocin  if tachysystole with non-reassuring FHR is present   Notify physician (specify) Tachysystole is defined as more than 5 contractions in a 10-minute time period averaged over a 30-minute window   Initiate intrauterine resuscitation if tachysystole with non-reassuring FHR is present   Notify physician (specify) Tachysystole is defined as more than 5 contractions in a 10-minute time period averaged over a 30-minute window   May administer Terbutaline  0.25 mg SQ x 1 dose if tachysystole with non-reassuring FHR is present   Labor Induction   Full code   Type and screen   Insert and maintain IV Line   Admit to Inpatient (patient's expected length of stay will be greater than 2 midnights or inpatient only procedure)   Meds ordered this encounter  Medications   lactated ringers  infusion   FOLLOWED BY Linked Order Group    oxytocin  (PITOCIN ) IV BOLUS FROM BAG    oxytocin  (PITOCIN ) IV infusion 30 units in NS 500 mL - Premix   lactated ringers  infusion 500-1,000 mL   acetaminophen  (TYLENOL ) tablet 650 mg   oxyCODONE -acetaminophen  (PERCOCET/ROXICET) 5-325 MG per tablet 1 tablet   ondansetron  (ZOFRAN ) injection 4 mg   sodium citrate-citric acid  (ORACIT) solution 30 mL   lidocaine  (PF) (XYLOCAINE ) 1 % injection 30 mL   fentaNYL  (SUBLIMAZE ) injection 50-100 mcg   terbutaline  (BRETHINE ) injection 0.25 mg   AND Linked Order Group    misoprostol  (CYTOTEC ) tablet 50 mcg    misoprostol  (CYTOTEC ) tablet 25 mcg   oxytocin  (PITOCIN ) IV infusion 30 units in NS 500 mL - Premix    Begin infusion at::   2 milli-units/min (2 mL/hr)    Increase infusion by::   2 milli-units/min (2 mL/hr)    ASSESSMENT   1. 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 Supervision of high risk pregnancy in third trimester   3. Gestational hypertension, third trimester   4. [redacted] weeks gestation of pregnancy   5.  Indication for care in labor or delivery     PLAN  Admit to L&D for IOL d/t GHTN at >37 weeks. Care turned over to L&D team.   Camie Rote, MSN, CNM 02/07/2024 6:27 PM  Certified Nurse Midwife, [facility]    [1]  Social History Tobacco Use   Smoking status: Never    Passive exposure: Never   Smokeless tobacco: Never   Tobacco comments:    Stopped smoking 3 months before she got pregnant  Vaping Use   Vaping status: Former   Substances: Nicotine   Substance Use Topics   Alcohol use: Not Currently   Drug use: Never  [2]  Allergies Allergen Reactions   Other Shortness Of Breath    Dust, Reports smells,perfumes,aerosols HX asthma    Latex Rash

## 2024-02-07 NOTE — MAU Note (Addendum)
 Pt reports high blood pressure at virtual appointment in 150s systolic, and 140s systolic on recheck. Pt also reports decreased fetal movement since this morning around 0400. Pt reports frequent contractions at a 6/10 that started around 1620. Pt reports no vaginal bleeding or leaking but states that she lost her mucus plug 3 days ago.

## 2024-02-07 NOTE — Telephone Encounter (Signed)
 Patient called regarding decreased fetal movement. Patient had a virtual appointment this morning and states she reported normal fetal movement during the appointment, but as time progressed she noticed baby was not as active. Patient denies vaginal bleeding and leaking of fluid. Patient reports BP 142/76 states she was advised to recheck BP 4 hours after virtual appointment. I advised the patient to be seen at MAU. Patient states she will call her husband and have him take her to MAU.   Devon, RN

## 2024-02-08 ENCOUNTER — Inpatient Hospital Stay (HOSPITAL_COMMUNITY): Payer: MEDICAID | Admitting: Anesthesiology

## 2024-02-08 ENCOUNTER — Encounter (HOSPITAL_COMMUNITY): Payer: Self-pay | Admitting: Obstetrics and Gynecology

## 2024-02-08 LAB — CBC
HCT: 31.2 % — ABNORMAL LOW (ref 36.0–46.0)
Hemoglobin: 10 g/dL — ABNORMAL LOW (ref 12.0–15.0)
MCH: 24.8 pg — ABNORMAL LOW (ref 26.0–34.0)
MCHC: 32.1 g/dL (ref 30.0–36.0)
MCV: 77.4 fL — ABNORMAL LOW (ref 80.0–100.0)
Platelets: 276 10*3/uL (ref 150–400)
RBC: 4.03 MIL/uL (ref 3.87–5.11)
RDW: 16 % — ABNORMAL HIGH (ref 11.5–15.5)
WBC: 12.4 10*3/uL — ABNORMAL HIGH (ref 4.0–10.5)
nRBC: 0 % (ref 0.0–0.2)

## 2024-02-08 LAB — COMPREHENSIVE METABOLIC PANEL WITH GFR
ALT: 13 U/L (ref 0–44)
AST: 18 U/L (ref 15–41)
Albumin: 3 g/dL — ABNORMAL LOW (ref 3.5–5.0)
Alkaline Phosphatase: 197 U/L — ABNORMAL HIGH (ref 38–126)
Anion gap: 11 (ref 5–15)
BUN: 5 mg/dL — ABNORMAL LOW (ref 6–20)
CO2: 18 mmol/L — ABNORMAL LOW (ref 22–32)
Calcium: 9.1 mg/dL (ref 8.9–10.3)
Chloride: 109 mmol/L (ref 98–111)
Creatinine, Ser: 0.54 mg/dL (ref 0.44–1.00)
GFR, Estimated: >60 mL/min
Glucose, Bld: 110 mg/dL — ABNORMAL HIGH (ref 70–99)
Potassium: 3.6 mmol/L (ref 3.5–5.1)
Sodium: 139 mmol/L (ref 135–145)
Total Bilirubin: 0.3 mg/dL (ref 0.0–1.2)
Total Protein: 6.1 g/dL — ABNORMAL LOW (ref 6.5–8.1)

## 2024-02-08 LAB — SYPHILIS: RPR W/REFLEX TO RPR TITER AND TREPONEMAL ANTIBODIES, TRADITIONAL SCREENING AND DIAGNOSIS ALGORITHM: RPR Ser Ql: NONREACTIVE

## 2024-02-08 MED ORDER — SENNOSIDES-DOCUSATE SODIUM 8.6-50 MG PO TABS
2.0000 | ORAL_TABLET | Freq: Every day | ORAL | Status: DC
  Administered 2024-02-09: 2 via ORAL
  Filled 2024-02-08: qty 2

## 2024-02-08 MED ORDER — SIMETHICONE 80 MG PO CHEW: 80.0000 mg | CHEWABLE_TABLET | ORAL | Status: DC | PRN

## 2024-02-08 MED ORDER — IBUPROFEN 600 MG PO TABS
600.0000 mg | ORAL_TABLET | Freq: Four times a day (QID) | ORAL | Status: DC
  Administered 2024-02-08 – 2024-02-09 (×4): 600 mg via ORAL
  Filled 2024-02-08 (×4): qty 1

## 2024-02-08 MED ORDER — BUPIVACAINE HCL (PF) 0.25 % IJ SOLN
INTRAMUSCULAR | Status: DC | PRN
  Administered 2024-02-08 (×2): 5 mL via EPIDURAL

## 2024-02-08 MED ORDER — LEVONORGESTREL 20 MCG/DAY IU IUD
1.0000 | INTRAUTERINE_SYSTEM | Freq: Once | INTRAUTERINE | Status: AC
  Administered 2024-02-08: 1 via INTRAUTERINE
  Filled 2024-02-08: qty 1

## 2024-02-08 MED ORDER — TETANUS-DIPHTH-ACELL PERTUSSIS 5-2-15.5 LF-MCG/0.5 IM SUSP: 0.5000 mL | Freq: Once | INTRAMUSCULAR | Status: DC

## 2024-02-08 MED ORDER — FENTANYL CITRATE (PF) 100 MCG/2ML IJ SOLN
INTRAMUSCULAR | Status: DC | PRN
  Administered 2024-02-08 (×2): 50 ug via EPIDURAL

## 2024-02-08 MED ORDER — LIDOCAINE-EPINEPHRINE (PF) 1.5 %-1:200000 IJ SOLN
INTRAMUSCULAR | Status: DC | PRN
  Administered 2024-02-08: 5 mL via EPIDURAL

## 2024-02-08 MED ORDER — BENZOCAINE-MENTHOL 20-0.5 % EX AERO: 1.0000 | INHALATION_SPRAY | CUTANEOUS | Status: DC | PRN

## 2024-02-08 MED ORDER — PRENATAL MULTIVITAMIN CH
1.0000 | ORAL_TABLET | Freq: Every day | ORAL | Status: DC
  Administered 2024-02-09: 1 via ORAL
  Filled 2024-02-08: qty 1

## 2024-02-08 MED ORDER — TERBUTALINE SULFATE 1 MG/ML IJ SOLN: 0.2500 mg | Freq: Once | INTRAMUSCULAR | Status: DC | PRN

## 2024-02-08 MED ORDER — WITCH HAZEL-GLYCERIN EX PADS: 1.0000 | MEDICATED_PAD | CUTANEOUS | Status: DC | PRN

## 2024-02-08 MED ORDER — SODIUM CHLORIDE 0.9 % IV SOLN
INTRAVENOUS | Status: DC | PRN
  Administered 2024-02-08: 12 mL/h via EPIDURAL

## 2024-02-08 MED ORDER — ACETAMINOPHEN 325 MG PO TABS: 650.0000 mg | ORAL_TABLET | ORAL | Status: DC | PRN

## 2024-02-08 MED ORDER — MISOPROSTOL 50MCG HALF TABLET: 50.0000 ug | ORAL_TABLET | ORAL | Status: DC

## 2024-02-08 MED ORDER — ONDANSETRON HCL 4 MG/2ML IJ SOLN: 4.0000 mg | INTRAMUSCULAR | Status: DC | PRN

## 2024-02-08 MED ORDER — FENTANYL-BUPIVACAINE-NACL 0.5-0.125-0.9 MG/250ML-% EP SOLN
12.0000 mL/h | EPIDURAL | Status: DC | PRN
  Administered 2024-02-08: 12 mL/h via EPIDURAL

## 2024-02-08 MED ORDER — DIPHENHYDRAMINE HCL 25 MG PO CAPS: 25.0000 mg | ORAL_CAPSULE | Freq: Four times a day (QID) | ORAL | Status: DC | PRN

## 2024-02-08 MED ORDER — ONDANSETRON HCL 4 MG PO TABS: 4.0000 mg | ORAL_TABLET | ORAL | Status: DC | PRN

## 2024-02-08 MED ORDER — FENTANYL-BUPIVACAINE-NACL 0.5-0.125-0.9 MG/250ML-% EP SOLN
EPIDURAL | Status: AC
  Filled 2024-02-08: qty 250

## 2024-02-08 MED ORDER — DIBUCAINE (PERIANAL) 1 % EX OINT: 1.0000 | TOPICAL_OINTMENT | CUTANEOUS | Status: DC | PRN

## 2024-02-08 MED ORDER — COCONUT OIL OIL: 1.0000 | TOPICAL_OIL | Status: DC | PRN

## 2024-02-08 MED ORDER — ZOLPIDEM TARTRATE 5 MG PO TABS: 5.0000 mg | ORAL_TABLET | Freq: Every evening | ORAL | Status: DC | PRN

## 2024-02-08 MED ORDER — OXYTOCIN-SODIUM CHLORIDE 30-0.9 UT/500ML-% IV SOLN: 1.0000 m[IU]/min | INTRAVENOUS | Status: DC

## 2024-02-08 NOTE — Anesthesia Postprocedure Evaluation (Signed)
"   Anesthesia Post Note  Patient: Emily Golden  Procedure(s) Performed: AN AD HOC LABOR EPIDURAL     Patient location during evaluation: Mother Baby Anesthesia Type: Epidural Level of consciousness: awake and alert Pain management: pain level controlled Vital Signs Assessment: post-procedure vital signs reviewed and stable Respiratory status: spontaneous breathing, nonlabored ventilation and respiratory function stable Cardiovascular status: stable Postop Assessment: no headache, no backache and epidural receding Anesthetic complications: no   No notable events documented.  Last Vitals:  Vitals:   02/08/24 1700 02/08/24 1952  BP: 100/63 (!) 106/54  Pulse: 73 75  Resp: 18 16  Temp: 36.6 C (!) 36.4 C  SpO2: 100% 100%    Last Pain:  Vitals:   02/08/24 1952  TempSrc: Oral  PainSc: 3    Pain Goal:                   Harvey Erna Jansky      "

## 2024-02-08 NOTE — Progress Notes (Signed)
 Labor Progress Note Emily Golden is a 24 y.o. H3E8958 at [redacted]w[redacted]d presented for induction of labor due to Gestational Hypertension and BMI 1  S:  Patient is comfortable with her epidural, she is resting with family at bedside.   O:  BP 125/68   Pulse 84   Temp 97.8 F (36.6 C) (Oral)   Resp 18   LMP 05/15/2023   SpO2 99%  FHT:  FHR: 110 bpm, variability: moderate,  accelerations:  Present,  decelerations:  Absent UC:   regular, every 2-4 minutes  CVE: Dilation: 4 Effacement (%): 50 Cervical Position: Middle Station: -3 Presentation: Vertex Exam by:: Asma Boldon, CNM   A&P: 24 y.o. H3E8958 @ [redacted]w[redacted]d Induction of labor for gestational hypertension progressing well on ptocin. SVE 4/70/-3.   AROM completed. Patient examined and found to be 4/70/-3, able to easily assess membranes and rupture without complications.  Moderate clear fluid. IUPC placed, cat 1 currently. Continue increasing pitocin .   Labor: Progressing on Pitocin , will continue to increase then AROM Preeclampsia:  labs stable Fetal Wellbeing:  Category I Pain Control:  Epidural I/D:  GBS negative Anticipated MOD:  NSVD  Raegyn Renda R Jazma Pickel, CNM 10:20 AM

## 2024-02-08 NOTE — Anesthesia Preprocedure Evaluation (Addendum)
 "                                  Anesthesia Evaluation  Patient identified by MRN, date of birth, ID band Patient awake    Reviewed: Allergy & Precautions, NPO status , Patient's Chart, lab work & pertinent test results  Airway Mallampati: II  TM Distance: >3 FB Neck ROM: Full    Dental no notable dental hx.    Pulmonary asthma    Pulmonary exam normal        Cardiovascular hypertension,  Rhythm:Regular Rate:Normal     Neuro/Psych   Anxiety  Bipolar Disorder   negative neurological ROS     GI/Hepatic negative GI ROS, Neg liver ROS,,,  Endo/Other  negative endocrine ROS    Renal/GU negative Renal ROS  negative genitourinary   Musculoskeletal negative musculoskeletal ROS (+)    Abdominal  (+) + obese  Peds  Hematology  (+) Blood dyscrasia, anemia Lab Results      Component                Value               Date                      WBC                      11.8 (H)            02/07/2024                HGB                      10.9 (L)            02/07/2024                HCT                      34.5 (L)            02/07/2024                MCV                      77.9 (L)            02/07/2024                PLT                      309                 02/07/2024              Anesthesia Other Findings   Reproductive/Obstetrics (+) Pregnancy                              Anesthesia Physical Anesthesia Plan  ASA: 3  Anesthesia Plan: Epidural   Post-op Pain Management:    Induction:   PONV Risk Score and Plan: 2 and Treatment may vary due to age or medical condition  Airway Management Planned: Natural Airway  Additional Equipment: None  Intra-op Plan:   Post-operative Plan:   Informed Consent: I have reviewed the patients History and Physical, chart, labs and  discussed the procedure including the risks, benefits and alternatives for the proposed anesthesia with the patient or authorized representative who  has indicated his/her understanding and acceptance.     Dental advisory given  Plan Discussed with:   Anesthesia Plan Comments:         Anesthesia Quick Evaluation  "

## 2024-02-08 NOTE — Discharge Summary (Signed)
 "    Postpartum Discharge Summary  Date of Service updated***     Patient Name: Emily Golden DOB: [birth year] MRN: [id number]  Date of admission: 02/07/2024 Delivery date:02/08/2024 Delivering provider: JUNETTE MAUS R Date of discharge: 02/08/2024  Admitting diagnosis: Indication for care or intervention in labor or delivery [O75.9] Intrauterine pregnancy: [redacted]w[redacted]d     Secondary diagnosis:  Principal Problem:   Indication for care or intervention in labor or delivery Active Problems:   Bipolar 2 disorder (HCC)   GAD (generalized anxiety disorder)   Hx 2024 child with tetralogy of Fallot   Anemia during pregnancy   LGA (large for gestational age) fetus affecting mother, antepartum   BMI 45.0-49.9, adult (HCC)   Hypertension in pregnancy  Additional problems: None    Discharge diagnosis: Term Pregnancy Delivered and Gestational Hypertension                                              Post partum procedures:None Augmentation: AROM, Pitocin , and Cytotec  Complications: None  Hospital course: Induction of Labor With Vaginal Delivery   24 y.o. yo H3E7957 at [redacted]w[redacted]d was admitted to the hospital 02/07/2024 for induction of labor.  Indication for induction: Gestational hypertension.  Patient had an uncomplicated labor course  Membrane Rupture Time/Date: 10:00 AM,02/08/2024  Delivery Method:Vaginal, Spontaneous Operative Delivery:N/A Episiotomy: None Lacerations:  1st degree;Perineal Details of delivery can be found in separate delivery note.  Patient had a postpartum course complicated by***. Patient is discharged home 02/08/24.  Newborn Data: Birth date:02/08/2024 Birth time:11:54 AM Gender:Female Living status:Living Apgars:8 ,9  Weight:3380 g  Magnesium  Sulfate received: No BMZ received: No Rhophylac:No MMR:No T-DaP:Given prenatally Flu: Yes RSV Vaccine received: No Transfusion:No  Immunizations received: Immunization History  Administered Date(s) Administered   DTaP  12/17/2001, 04/24/2003, 05/24/2004, 12/13/2004, 11/06/2006   Dtap, Unspecified 12/17/2001, 04/15/2003, 05/24/2004   Fluzone Influenza virus vaccine,trivalent (IIV3), split virus 12/13/2004   HIB, Unspecified 12/17/2001, 04/15/2003, 04/24/2003, 05/24/2004   HPV 9-valent 11/11/2016, 04/11/2017   Hepatitis A, Ped/Adol-2 Dose 11/06/2006, 07/27/2009   Hepatitis B, PED/ADOLESCENT 08/20/2001, 11/19/2001, 04/15/2003   IPV 12/17/2001, 04/24/2003, 05/24/2004, 11/06/2006   Influenza Nasal 11/06/2006   Influenza, Seasonal, Injecte, Preservative Fre 11/11/2016, 10/06/2022, 12/14/2023   Influenza,inj,Quad PF,6+ Mos 12/20/2011, 12/20/2012, 10/30/2014, 11/11/2016, 10/20/2017   MMR 05/24/2004, 11/06/2006   Meningococcal Conjugate 09/29/2014, 01/26/2018   PFIZER(Purple Top)SARS-COV-2 Vaccination 08/08/2019, 08/29/2019   Pneumococcal Conjugate PCV 7 12/17/2001, 04/15/2003, 04/24/2003, 05/24/2004   Polio, Unspecified 04/15/2003   Tdap 12/20/2012, 10/06/2022, 12/14/2023   Varicella 12/13/2004, 11/06/2006    Physical exam  Vitals:   02/08/24 1005 02/08/24 1032 02/08/24 1217 02/08/24 1233  BP: 125/68 131/65 (!) 110/58 (!) 109/51  Pulse: 84 71 99 71  Resp:      Temp:      TempSrc:      SpO2:       General: {Exam; general:21111117} Lochia: {Desc; appropriate/inappropriate:30686::appropriate} Uterine Fundus: {Desc; firm/soft:30687} Incision: {Exam; incision:21111123} DVT Evaluation: {Exam; dvt:2111122} Labs: Lab Results  Component Value Date   WBC 11.8 (H) 02/07/2024   HGB 10.9 (L) 02/07/2024   HCT 34.5 (L) 02/07/2024   MCV 77.9 (L) 02/07/2024   PLT 309 02/07/2024      Latest Ref Rng & Units 02/07/2024    6:58 PM  CMP  Glucose 70 - 99 mg/dL 68   BUN 6 - 20 mg/dL 7   Creatinine 9.55 -  1.00 mg/dL 9.50   Sodium 864 - 854 mmol/L 136   Potassium 3.5 - 5.1 mmol/L 4.5   Chloride 98 - 111 mmol/L 105   CO2 22 - 32 mmol/L 16   Calcium 8.9 - 10.3 mg/dL 9.6   Total Protein 6.5 - 8.1 g/dL 7.0    Total Bilirubin 0.0 - 1.2 mg/dL 0.4   Alkaline Phos 38 - 126 U/L 220   AST 15 - 41 U/L 30   ALT 0 - 44 U/L 13    Edinburgh Score:     No data to display         No data recorded  After visit meds:  Allergies as of 02/08/2024       Reactions   Other Shortness Of Breath   Dust, Reports smells,perfumes,aerosols HX asthma   Latex Rash     Med Rec must be completed prior to using this [facility]***        Discharge home in stable condition Infant Feeding: {Baby feeding:23562} Infant Disposition:{CHL IP OB HOME WITH FNUYZM:76418} Discharge instruction: per After Visit Summary and Postpartum booklet. Activity: Advance as tolerated. Pelvic rest for 6 weeks.  Diet: {OB ipzu:78888878} Future Appointments: Future Appointments  Date Time Provider Department Center  02/09/2024  8:45 AM [facility] NST [facility]  02/16/2024 10:35 AM Anyanwu, Gloris LABOR, MD [facility]  02/21/2024  4:15 PM Ilean Norleen GAILS, MD [facility]  04/10/2024 10:00 AM Tobb, Kardie, DO CVD-MAGST H&V   Follow up Visit:  Note sent to [facility] Please schedule this patient for a In person postpartum visit in 6 weeks with the following provider: Any provider. Additional Postpartum F/U:Postpartum Depression checkup and BP check 1 week  High risk pregnancy complicated by: HTN Delivery mode:  Vaginal, Spontaneous Anticipated Birth Control:  PP IUD placed   02/08/2024 Tawni SAUNDERS Ashrith Sagan, CNM    "

## 2024-02-08 NOTE — Lactation Note (Signed)
 This note was copied from a baby's chart. Lactation Consultation Note  Patient Name: Emily Golden Unijb'd Date: 02/08/2024 Age:24 years old Reason for consult: Initial assessment;Early term 37-38.6wks (See MOB: MR-short interval of pregnancy, Anemia, GHTN).  MOB medication list : Aripiprazole  is L3 compatible with breastfeeding pediatric concerns: sleepy, sedation and poor feedings and weight gain;Buspar  is L3- compatible with breastfeeding,  pediatric concern: weight gain monitor infant feedings; and Cyclobenzaprine - L3 compatible with breastfeeding, pediatric concerns: drowsiness and sedation, poor feeding.  Will monitor infant's feedings.   MOB latched infant on her right breast with pillow support using the cross cradle hold, infant breast feed for 13 minutes and MOB already knows how to hand express, infant given 2 mls of colostrum by spoon. MOB will continue to breastfeed infant by cues, on demand, 8-12 times within 24 hours, skin to skin. Per MOB, infant had one void and one stool since birth. MOB will monitor infant and knows that medications MOB is taking may cause downiness in infant. MOB knows to call [facility] services if she has breastfeeding questions, concerns or need further latch assistance. LC discussed the importance of maternal rest, meals and hydration. MOB was made aware of O/P services, breastfeeding support groups, community resources, and our phone # for post-discharge questions.    MOB informed LC she does have DEBP at home. Maternal Data Has patient been taught Hand Expression?: Yes Does the patient have breastfeeding experience prior to this delivery?: Yes How long did the patient breastfeed?: Per MOB, 1st child was in NICU 2 weeks so never latch, she pumped for 2 months.  Feeding Mother's Current Feeding Choice: Breast Milk  LATCH Score Latch: Grasps breast easily, tongue down, lips flanged, rhythmical sucking.  Audible Swallowing: A few with stimulation  Type of Nipple:  Everted at rest and after stimulation  Comfort (Breast/Nipple): Soft / non-tender  Hold (Positioning): Assistance needed to correctly position infant at breast and maintain latch.  LATCH Score: 8   Lactation Tools Discussed/Used    Interventions Interventions: Breast feeding basics reviewed;Assisted with latch;Skin to skin;Hand express;Adjust position;Breast compression;Support pillows;Position options;Expressed milk;DEBP;Education;LC Services brochure;Guidelines for Milk Supply and Pumping Schedule Handout;CDC milk storage guidelines;CDC Guidelines for Breast Pump Cleaning  Discharge Pump: DEBP;Personal (Per MOB, she has Medela DEBP at home.)  Consult Status Consult Status: Follow-up Date: 02/09/24 Follow-up type: In-patient    Grayce LULLA Batter 02/08/2024, 5:54 PM

## 2024-02-08 NOTE — Progress Notes (Signed)
 Emily Golden is a 24 y.o. H3E8958 at [redacted]w[redacted]d by LMP admitted for induction of labor due to Gestational Hypertension and BMI 49  Subjective: Patient reports feeling comfortable with epidural. Side lying in bed with support pillows. S/P cytotec  x2.  Objective: BP (!) 117/53   Pulse 75   Temp 97.8 F (36.6 C) (Oral)   Resp 18   LMP 05/15/2023   SpO2 99%  No intake/output data recorded. No intake/output data recorded.  FHT:  FHR: 110 bpm, variability: moderate,  accelerations:  Present,  decelerations:  Absent UC:   regular, every 2-4 minutes SVE:   Dilation: 4 Effacement (%): 70 Station: -3 Exam by:: Edsel Generous SNM  Labs: Lab Results  Component Value Date   WBC 11.8 (H) 02/07/2024   HGB 10.9 (L) 02/07/2024   HCT 34.5 (L) 02/07/2024   MCV 77.9 (L) 02/07/2024   PLT 309 02/07/2024    Assessment / Plan: Induction of labor for gestational hypertension progressing well on cytotec .Last dose given at 0018. SVE 4/70/-3. Plan to start pitocin  2x2.  Labor: Progressing on Pitocin , will continue to increase then AROM Preeclampsia:  labs stable Fetal Wellbeing:  Category I Pain Control:  Epidural I/D:  GBS negative Anticipated MOD:  NSVD  [name], Student-MidWife 02/08/2024, 5:16 AM

## 2024-02-08 NOTE — Anesthesia Procedure Notes (Signed)
 Epidural Patient location during procedure: OB Start time: 02/08/2024 3:10 AM End time: 02/08/2024 3:27 AM  Staffing Anesthesiologist: Dorethea Cordella SQUIBB, DO Performed: anesthesiologist   Preanesthetic Checklist Completed: patient identified, IV checked, site marked, risks and benefits discussed, surgical consent, monitors and equipment checked, pre-op evaluation and timeout performed  Epidural Patient position: sitting Prep: ChloraPrep Patient monitoring: heart rate, continuous pulse ox and blood pressure Approach: midline Location: L3-L4 Injection technique: LOR saline  Needle:  Needle insertion depth (cm): 10 Needle type: Tuohy  Needle gauge: 17 G Needle length: 9 cm Needle insertion depth: 10 cm Catheter type: closed end flexible Catheter size: 20 Guage Catheter at skin depth: 17 cm Test dose: negative and 1.5% lidocaine  with Epi 1:200 K  Assessment Events: blood not aspirated, no cerebrospinal fluid, injection not painful, no injection resistance and no paresthesia  Additional Notes Patient identified. Risks/Benefits/Options discussed with patient including but not limited to bleeding, infection, nerve damage, paralysis, failed block, incomplete pain control, headache, blood pressure changes, nausea, vomiting, reactions to medications, itching and postpartum back pain. Confirmed with bedside nurse the patient's most recent platelet count. Confirmed with patient that they are not currently taking any anticoagulation, have any bleeding history or any family history of bleeding disorders. Patient expressed understanding and wished to proceed. All questions were answered. Sterile technique was used throughout the entire procedure. Please see nursing notes for vital signs. Test dose was given through epidural catheter and negative prior to continuing to dose epidural or start infusion. Warning signs of high block given to the patient including shortness of breath, tingling/numbness  in hands, complete motor block, or any concerning symptoms with instructions to call for help. Patient was given instructions on fall risk and not to get out of bed. All questions and concerns addressed with instructions to call with any issues or inadequate analgesia.    Reason for block:procedure for pain

## 2024-02-08 NOTE — Progress Notes (Signed)
" °  Post-Placental IUD Insertion Procedure Note  Patient identified, informed consent signed prior to delivery, signed copy in chart, time out was performed.    Vaginal, labial and perineal areas thoroughly inspected for lacerations. 1st degree laceration identified - not hemostatic,repaired , not repaired prior to insertion of IUD.  Mirena .  - IUD grasped between sterile gloved fingers. Sterile lubrication applied to sterile gloved hand for ease of insertion. Fundus identified through abdominal wall using non-insertion hand. IUD inserted to fundus with bimanual technique. IUD carefully released at the fundus and insertion hand gently removed from vagina.   - IUD inserted with inserter per manufacturer's instructions.    Strings trimmed to the level of the introitus. Patient tolerated procedure well.  Lot # [id number]  Expiration Date 03/04/2026  Patient given post procedure instructions and IUD care card with expiration date.  Patient is asked to keep IUD strings tucked in her vagina until her postpartum follow up visit in 4-6 weeks. Patient advised to abstain from sexual intercourse and pulling on strings before her follow-up visit. Patient verbalized an understanding of the plan of care and agrees.   "

## 2024-02-09 ENCOUNTER — Ambulatory Visit: Payer: MEDICAID

## 2024-02-09 LAB — CBC
HCT: 30.2 % — ABNORMAL LOW (ref 36.0–46.0)
Hemoglobin: 9.3 g/dL — ABNORMAL LOW (ref 12.0–15.0)
MCH: 24.5 pg — ABNORMAL LOW (ref 26.0–34.0)
MCHC: 30.8 g/dL (ref 30.0–36.0)
MCV: 79.5 fL — ABNORMAL LOW (ref 80.0–100.0)
Platelets: 288 10*3/uL (ref 150–400)
RBC: 3.8 MIL/uL — ABNORMAL LOW (ref 3.87–5.11)
RDW: 16.1 % — ABNORMAL HIGH (ref 11.5–15.5)
WBC: 10.2 10*3/uL (ref 4.0–10.5)
nRBC: 0 % (ref 0.0–0.2)

## 2024-02-09 LAB — BIRTH TISSUE RECOVERY COLLECTION (PLACENTA DONATION)

## 2024-02-09 MED ORDER — IBUPROFEN 600 MG PO TABS: 600.0000 mg | ORAL_TABLET | Freq: Four times a day (QID) | ORAL | Status: AC | PRN

## 2024-02-09 MED ORDER — FERROUS SULFATE 325 (65 FE) MG PO TABS
325.0000 mg | ORAL_TABLET | ORAL | Status: DC
  Administered 2024-02-09: 325 mg via ORAL
  Filled 2024-02-09: qty 1

## 2024-02-09 NOTE — Patient Instructions (Signed)
 If you are interested in an outpatient lactation consultation -- available in-office or virtually -- please reach out to us  at:  [facility] (First Floor) ?? [address] [place], [place]  ?? [phone] Please leave a message on our lactation voicemail box. We welcome any lactation-related questions or concerns -- our team is here to support you and your baby.  Lactation Support Groups Join us  at: [facility] ?? Tuesdays, 10:00 AM - 12:00 PM ?? [address], [employer], [employer]  Lactating parents and lap babies are welcome!  ?? [email or web address]  ?? [email or web address]  Si est interesado en una consulta ambulatoria de lactancia, disponible en el consultorio o virtualmente, comunquese con nosotros en:  [facility] ?? [address], [place], [place]  ?? [phone] Por favor, deje un mensaje en nuestro buzn de voz de lactancia. Estamos aqu para responder cualquier pregunta o inquietud relacionada con la lactancia y para apoyarle a usted y a su beb.  Grupos de Apoyo para la Lactancia nase a nosotros en: [facility] ?? [place], de 10:00 a. m. a 12:00 p. m. ?? [address], [place], [place]  Se admiten madres lactantes y bebs en regazo.  ?? [email or web address]  ?? [email or web address]      Delaney Mandril, [facility]

## 2024-02-09 NOTE — Social Work (Signed)
CSW acknowledged consult and completed a clinical assessment.  There are no barriers to d/c.  Clinical assessment notes will be entered at a later time.  [name], LCSWA Clinical Social Worker [phone]  

## 2024-02-09 NOTE — Progress Notes (Incomplete)
 POSTPARTUM PROGRESS NOTE  Post Partum Day 1  Subjective:  Emily Golden is a 24 y.o. H3E7957 s/p *** at [redacted]w[redacted]d.  She reports she is doing well. No acute events overnight. She denies any problems with ambulating, voiding or po intake. Denies nausea or vomiting.  Pain is {DESC; WELL/MODERATELY/POORLY:30679} controlled.  Lochia is {Normal/Abnormal:304960160::Normal}.  Objective: Blood pressure 104/60, pulse 76, temperature 97.9 F (36.6 C), temperature source Oral, resp. rate 18, last menstrual period 05/15/2023, SpO2 99%, unknown if currently breastfeeding.  BP Readings from Last 3 Encounters:  02/09/24 104/60  02/07/24 (!) 147/90  02/02/24 109/83    Physical Exam:  General: alert, cooperative and no distress Chest: no respiratory distress Heart:regular rate, distal pulses intact Uterine Fundus: firm, appropriately tender DVT Evaluation: No calf swelling or tenderness Extremities: {Numbers; edema:17696::none} edema Skin: warm, dry  Recent Labs    02/08/24 1652 02/09/24 0526  HGB 10.0* 9.3*  HCT 31.2* 30.2*    Assessment/Plan: Emily Golden is a 24 y.o. H3E7957 s/p NSVD at [redacted]w[redacted]d   PPD# 1 - Doing well  Routine postpartum care  Delivery Complications: {HX PREGNANCY COMPLICATIONS OBGYN:313100} *** Blood Pressure: normal Anemia/Hb Status: Low, start PO ferrous sulfate  Contraception: Post-Placental IUD Feeding: {Breast or Bottle:2027147500} Needs Lactation Consultation: {YES/NO:21197} ***  Dispo: Plan for discharge {Today or [phone]}.   LOS: 2 days   Tyress Loden Alena Morrison, MD [facility] PGY-1 02/09/2024, 7:04 AM

## 2024-02-09 NOTE — Progress Notes (Signed)
 Emily Golden called out for nurse during patient rounds that patient felt dizzy/lightheaded after feeling a gush. Patient assessed - fundus firm and bleeding scant. Vital signs normal. Cold washcloth placed over patient's forehead and rested in bed for a few minutes. Patient stated she felt better, patient stable when nurse left room. Dr. Letha notified of episode in the hallway by nurse.

## 2024-02-12 ENCOUNTER — Inpatient Hospital Stay (HOSPITAL_COMMUNITY): Admission: RE | Admit: 2024-02-12 | Payer: MEDICAID | Source: Home / Self Care | Admitting: Obstetrics & Gynecology

## 2024-02-12 ENCOUNTER — Inpatient Hospital Stay (HOSPITAL_COMMUNITY): Payer: MEDICAID

## 2024-02-15 ENCOUNTER — Ambulatory Visit: Payer: MEDICAID

## 2024-02-16 ENCOUNTER — Encounter: Payer: Self-pay | Admitting: Obstetrics & Gynecology

## 2024-02-21 ENCOUNTER — Encounter: Payer: Self-pay | Admitting: Family Medicine

## 2024-03-21 ENCOUNTER — Ambulatory Visit: Payer: Self-pay | Admitting: Obstetrics and Gynecology

## 2024-04-10 ENCOUNTER — Ambulatory Visit: Payer: MEDICAID | Admitting: Cardiology
